# Patient Record
Sex: Female | Born: 1970 | Hispanic: Yes | Smoking: Never smoker
Health system: Southern US, Community
[De-identification: ages and names within clinical notes are randomized; demographics above are authoritative.]

## PROBLEM LIST (undated history)

## (undated) DIAGNOSIS — R7303 Prediabetes: Secondary | ICD-10-CM

## (undated) DIAGNOSIS — R011 Cardiac murmur, unspecified: Secondary | ICD-10-CM

## (undated) DIAGNOSIS — I10 Essential (primary) hypertension: Secondary | ICD-10-CM

## (undated) DIAGNOSIS — D649 Anemia, unspecified: Secondary | ICD-10-CM

## (undated) DIAGNOSIS — D259 Leiomyoma of uterus, unspecified: Secondary | ICD-10-CM

## (undated) HISTORY — DX: Prediabetes: R73.03

## (undated) HISTORY — DX: Essential (primary) hypertension: I10

## (undated) HISTORY — DX: Anemia, unspecified: D64.9

## (undated) HISTORY — DX: Cardiac murmur, unspecified: R01.1

## (undated) HISTORY — PX: HERNIA REPAIR: SHX51

## (undated) HISTORY — PX: BREAST IMPLANT REMOVAL: SUR1101

---

## 1999-04-25 HISTORY — PX: TUBAL LIGATION: SHX77

## 2002-04-24 HISTORY — PX: BACK SURGERY: SHX140

## 2008-04-24 HISTORY — PX: BREAST ENHANCEMENT SURGERY: SHX7

## 2011-04-25 HISTORY — PX: BREAST IMPLANT REMOVAL: SUR1101

## 2012-02-12 ENCOUNTER — Ambulatory Visit (INDEPENDENT_AMBULATORY_CARE_PROVIDER_SITE_OTHER): Payer: BC Managed Care – PPO | Admitting: Family

## 2012-02-12 ENCOUNTER — Encounter: Payer: Self-pay | Admitting: Family

## 2012-02-12 VITALS — BP 110/70 | HR 71 | Temp 98.1°F | Resp 16 | Ht 65.0 in | Wt 146.0 lb

## 2012-02-12 DIAGNOSIS — R350 Frequency of micturition: Secondary | ICD-10-CM

## 2012-02-12 DIAGNOSIS — I10 Essential (primary) hypertension: Secondary | ICD-10-CM | POA: Insufficient documentation

## 2012-02-12 DIAGNOSIS — H9190 Unspecified hearing loss, unspecified ear: Secondary | ICD-10-CM | POA: Insufficient documentation

## 2012-02-12 DIAGNOSIS — R21 Rash and other nonspecific skin eruption: Secondary | ICD-10-CM

## 2012-02-12 DIAGNOSIS — B0089 Other herpesviral infection: Secondary | ICD-10-CM | POA: Insufficient documentation

## 2012-02-12 DIAGNOSIS — N390 Urinary tract infection, site not specified: Secondary | ICD-10-CM

## 2012-02-12 DIAGNOSIS — H6120 Impacted cerumen, unspecified ear: Secondary | ICD-10-CM

## 2012-02-12 LAB — BASIC METABOLIC PANEL
Chloride: 102 mEq/L (ref 96–112)
Potassium: 4.6 mEq/L (ref 3.5–5.3)
Sodium: 137 mEq/L (ref 135–145)

## 2012-02-12 LAB — POCT URINALYSIS DIPSTICK
Ketones, UA: NEGATIVE
Spec Grav, UA: 1.025

## 2012-02-12 MED ORDER — CIPROFLOXACIN HCL 500 MG PO TABS: 500.0000 mg | ORAL_TABLET | Freq: Two times a day (BID) | ORAL | Status: DC

## 2012-02-12 NOTE — Progress Notes (Signed)
Subjective:    Patient ID: Debra Ayers, female    DOB: Jun 16, 1970, 41 y.o.   MRN: [id number]  HPI  Reports that she has been Followed in [place] (Dr. Thresa Ross).  Low  Back pain- left.  10 PM woke up with urgency.  Reports pain was very intense in her lower back- across, had to slide out of the bed. Pain was worse with any movement.  Denies dysuria or frequency.  + spotting due to recent placement of mirena.   She denies fever.  Denies past hx of low back pan.  Denies hx or kidney stones but reports hx of "kidney infection" which was very similar in nature.   Itching/burning- blister left hand.  Always in the hand.  Never in the fingers. Always around the palm of hand.  Tender, burning.    HTN- treated x 2 years.  Hx heart murmur-  Reports she was told + murmur several years ago.  Saw Cardiologist.     Review of Systems  Constitutional: Negative for fever and unexpected weight change.  HENT:       Reports that doesn't hear well from either ear.    Eyes: Negative for visual disturbance.  Respiratory: Positive for cough.        Mild cough  Gastrointestinal: Positive for constipation.  Genitourinary: Positive for frequency.  Musculoskeletal: Positive for back pain.  Skin: Positive for rash.  Neurological: Negative for headaches.  Hematological: Negative for adenopathy.  Psychiatric/Behavioral:       Denies depression/anxiety   Past Medical History  Diagnosis Date  . Hypertension   . Heart murmur     History   Social History  . Marital Status: Legally Separated    Spouse Name: N/A    Number of Children: 2  . Years of Education: N/A   Occupational History  .  [employer]   Social History Main Topics  . Smoking status: Never Smoker   . Smokeless tobacco: Never Used  . Alcohol Use: 1.8 oz/week    3 Cans of beer per week  . Drug Use: Not on file  . Sexually Active: Not on file   Other Topics Concern  . Not on file   Social History Narrative   Has 40  yr old and 12 year oldWorks as Social research officer, government at [employer] completed 2 years collegeEnjoys entertaining friends    Past Surgical History  Procedure Date  . Breast surgery 2000    Left breast biopsy, benign    Family History  Problem Relation Age of Onset  . Arthritis Mother   . Osteoporosis Mother   . Diabetes Maternal Grandmother     No Known Allergies  Current Outpatient Prescriptions on File Prior to Visit  Medication Sig Dispense Refill  . enalapril-hydrochlorothiazide (VASERETIC) 10-25 MG per tablet Take 1 tablet by mouth daily.        BP 110/70  Pulse 71  Temp 98.1 F (36.7 C) (Oral)  Resp 16  Ht 5\' 5"  (1.651 m)  Wt 146 lb 0.6 oz (66.243 kg)  BMI 24.30 kg/m2  SpO2 99%  LMP 01/29/2012        Objective:   Physical Exam  Constitutional: She is oriented to person, place, and time. She appears well-developed and well-nourished. No distress.  HENT:  Head: Normocephalic and atraumatic.       Bilateral cerumen impaction.  Cerumen removed with curette and lavage  Cardiovascular: Normal rate and regular rhythm.   No murmur heard.  Pulmonary/Chest: Effort normal and breath sounds normal. No respiratory distress. She has no wheezes. She has no rales. She exhibits no tenderness.  Genitourinary:       Bilateral CVAT L>R  Musculoskeletal: She exhibits no edema.  Neurological: She is alert and oriented to person, place, and time.  Skin: Skin is warm and dry.       Blister noted on palmar surface beneath base of thumb.  Purulent fluid noted inside blister with some surrounding erythema.    Psychiatric: She has a normal mood and affect. Her behavior is normal. Judgment and thought content normal.          Assessment & Plan:

## 2012-02-12 NOTE — Assessment & Plan Note (Signed)
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

## 2012-02-12 NOTE — Addendum Note (Signed)
Addended by: Mervin Kung A on: 02/12/2012 12:05 PM   Modules accepted: Orders

## 2012-02-12 NOTE — Patient Instructions (Addendum)
Please complete your blood work prior to leaving- we will let you know how this turns out. Please call if you develop worsening low back pain, fever over 101, or if urinary symptoms are not resolved in 3 days.  You will be contacted about your referral to the hearing specialist. Please let us know if you have not heard back within 1 week about your referral. Please schedule a fasting physical within the next 3 months. Welcome to [facility]!

## 2012-02-12 NOTE — Assessment & Plan Note (Signed)
Urine dip + leuks/blood. Will rx with cipro.  If worsening low back pain or if no improvement, continue CT.   Send urine for culture.

## 2012-02-12 NOTE — Assessment & Plan Note (Signed)
Continue vaseretic.  Obtain bmet.  BP stable.

## 2012-02-12 NOTE — Assessment & Plan Note (Signed)
Cerumen impaction removed today.  Scant cerumen remains and I have asked her to use OTC wax softening kit at home along with bulb syringe irrigation.  Refer to Audiology for further evaluation of possible hearing loss.

## 2012-02-13 ENCOUNTER — Encounter: Payer: Self-pay | Admitting: Family

## 2012-02-13 LAB — HSV 2 ANTIBODY, IGG: HSV 2 Glycoprotein G Ab, IgG: 13.6 IV — ABNORMAL HIGH

## 2012-02-13 LAB — HSV 1 ANTIBODY, IGG: HSV 1 Glycoprotein G Ab, IgG: 8.28 IV — ABNORMAL HIGH

## 2012-02-14 LAB — URINE CULTURE
Colony Count: NO GROWTH
Organism ID, Bacteria: NO GROWTH

## 2012-02-15 ENCOUNTER — Telehealth: Payer: Self-pay | Admitting: Family

## 2012-02-15 NOTE — Telephone Encounter (Signed)
Received medical records from [facility]. Melissa Champe-Seagle  P: [phone] F: [phone]

## 2012-02-16 ENCOUNTER — Telehealth: Payer: Self-pay | Admitting: Family

## 2012-02-16 NOTE — Telephone Encounter (Signed)
Spoke to pt. Reviewed + HSV1 and HSV2 results.  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 Could consider suppressive therapy at some point if she wants. Questions answered. Pt verbalizes understanding.

## 2012-02-16 NOTE — Telephone Encounter (Signed)
Also told pt that her urine culture was normal.  OK to stop abx.  Still having some back pain, recommended motrin and that she call us if no improvement.

## 2012-04-24 HISTORY — PX: BREAST SURGERY: SHX581

## 2012-05-17 ENCOUNTER — Encounter: Payer: BC Managed Care – PPO | Admitting: Family

## 2012-05-17 DIAGNOSIS — Z0289 Encounter for other administrative examinations: Secondary | ICD-10-CM

## 2012-07-31 ENCOUNTER — Ambulatory Visit
Admission: RE | Admit: 2012-07-31 | Discharge: 2012-07-31 | Disposition: A | Discharge status: Home / Self Care | Payer: BC Managed Care – PPO | Source: Ambulatory Visit | Attending: *Deleted | Admitting: *Deleted

## 2012-07-31 ENCOUNTER — Other Ambulatory Visit: Payer: Self-pay | Admitting: *Deleted

## 2012-07-31 DIAGNOSIS — R112 Nausea with vomiting, unspecified: Secondary | ICD-10-CM

## 2012-07-31 DIAGNOSIS — R109 Unspecified abdominal pain: Secondary | ICD-10-CM

## 2012-11-20 ENCOUNTER — Ambulatory Visit (INDEPENDENT_AMBULATORY_CARE_PROVIDER_SITE_OTHER): Payer: BC Managed Care – PPO | Admitting: Surgery

## 2012-11-20 ENCOUNTER — Encounter (INDEPENDENT_AMBULATORY_CARE_PROVIDER_SITE_OTHER): Payer: Self-pay | Admitting: Surgery

## 2012-11-20 ENCOUNTER — Telehealth (INDEPENDENT_AMBULATORY_CARE_PROVIDER_SITE_OTHER): Payer: Self-pay | Admitting: Surgery

## 2012-11-20 VITALS — BP 130/76 | HR 64 | Temp 97.8°F | Resp 14 | Ht 64.0 in | Wt 158.8 lb

## 2012-11-20 DIAGNOSIS — K420 Umbilical hernia with obstruction, without gangrene: Secondary | ICD-10-CM | POA: Insufficient documentation

## 2012-11-20 NOTE — Progress Notes (Signed)
General Surgery [facility]  Chief Complaint  Patient presents with  . New Evaluation    eval umb hernia - referral from Dr. Etter Sjogren    HISTORY: Patient is a 42 year old female referred by her plastic surgeon for evaluation of suspected umbilical hernia. Patient has noted a change in the shape of her umbilicus over the past several years. She has intermittent discomfort especially with physical activity. She denies any signs or symptoms of intestinal obstruction.  No prior abdominal surgery. Patient has had piercings at the umbilicus.  Patient is considering like a suction of the torso. The plastic surgeon identified a possible umbilical hernia and referred her to our office for evaluation and recommendations for management.  Past Medical History  Diagnosis Date  . Hypertension   . Heart murmur   . Anemia     Current Outpatient Prescriptions  Medication Sig Dispense Refill  . enalapril-hydrochlorothiazide (VASERETIC) 10-25 MG per tablet Take 1 tablet by mouth daily.       No current facility-administered medications for this visit.    No Known Allergies  Family History  Problem Relation Age of Onset  . Arthritis Mother   . Osteoporosis Mother   . Diabetes Maternal Grandmother     History   Social History  . Marital Status: Legally Separated    Spouse Name: N/A    Number of Children: 2  . Years of Education: N/A   Occupational History  .  [employer]   Social History Main Topics  . Smoking status: Never Smoker   . Smokeless tobacco: Never Used  . Alcohol Use: 1.8 oz/week    3 Cans of beer per week  . Drug Use: No  . Sexually Active: None   Other Topics Concern  . None   Social History Narrative   Has 41 yr old and 41 year old   Works as Social research officer, government at [employer]   Separated   She completed 2 years college   Enjoys entertaining friends    REVIEW OF SYSTEMS - PERTINENT POSITIVES ONLY: Denies signs or symptoms of  obstruction. Intermittent discomfort with physical activity. Change in shape and appearance of umbilicus over past several years.  EXAM: Filed Vitals:   11/20/12 0924  BP: 130/76  Pulse: 64  Temp: 97.8 F (36.6 C)  Resp: 14    HEENT: normocephalic; pupils equal and reactive; sclerae clear; dentition good; mucous membranes moist NECK:  symmetric on extension; no palpable anterior or posterior cervical lymphadenopathy; no supraclavicular masses; no tenderness CHEST: clear to auscultation bilaterally without rales, rhonchi, or wheezes CARDIAC: regular rate and rhythm without significant murmur; peripheral pulses are full ABDOMEN: soft without distension; bowel sounds present; no mass; no hepatosplenomegaly; deformity of the umbilicus with flattening; palpation reveals incarcerated adipose tissue, likely omentum, with moderate tenderness; hernia augments with Valsalva and set up maneuver; hernia is only partially reducible with some discomfort EXT:  non-tender without edema; no deformity NEURO: no gross focal deficits; no sign of tremor   LABORATORY RESULTS: See [facility] (CHL-Epic) for most recent results  RADIOLOGY RESULTS: See [facility] (CHL-Epic) for most recent results  IMPRESSION: Small umbilical hernia with incarcerated omentum  PLAN: I discussed the indications for surgical repair with the patient. I explained that we would use prosthetic mesh. We discussed the risk and benefits including the potential risk of recurrence which is approximately 5%.  Certainly her umbilical hernia repair procedure could be performed at the time of her  plastic surgical procedures. We will coordinate this with Dr. Etter Sjogren for a time convenient for the patient in the near future.  The risks and benefits of the procedure have been discussed at length with the patient.  The patient understands the proposed procedure, potential alternative treatments, and the course of recovery to be  expected.  All of the patient's questions have been answered at this time.  The patient wishes to proceed with surgery.  Velora Heckler, MD, FACS General & Endocrine Surgery [facility]  Primary Care Physician: Lemont Fillers., NP

## 2012-11-20 NOTE — Telephone Encounter (Signed)
Met with surgery scheduling went over financial responsibilities, co-ord surgery with Dr. Odis Luster, patient will call back to schedule

## 2012-11-20 NOTE — Patient Instructions (Signed)
[facility]  HERNIA REPAIR POST OP INSTRUCTIONS  Always review your discharge instruction sheet given to you by the facility where your surgery was performed.  1. A  prescription for pain medication may be given to you upon discharge.  Take your pain medication as prescribed.  If narcotic pain medicine is not needed, then you may take acetaminophen (Tylenol) or ibuprofen (Advil) as needed.  2. Take your usually prescribed medications unless otherwise directed.  3. If you need a refill on your pain medication, please contact your pharmacy.  They will contact our office to request authorization. Prescriptions will not be filled after 5 pm daily or on weekends.  4. You should follow a light diet the first 24 hours after arrival home, such as soup and crackers or toast.  Be sure to include plenty of fluids daily.  Resume your normal diet the day after surgery.  5. Most patients will experience some swelling and bruising around the surgical site.  Ice packs and reclining will help.  Swelling and bruising can take several days to resolve.   6. It is common to experience some constipation if taking pain medication after surgery.  Increasing fluid intake and taking a stool softener (such as Colace) will usually help or prevent this problem from occurring.  A mild laxative (Milk of Magnesia or Miralax) should be taken according to package directions if there are no bowel movements after 48 hours.  7. Unless discharge instructions indicate otherwise, you may remove your bandages 24-48 hours after surgery, and you may shower at that time.  You may have steri-strips (small skin tapes) in place directly over the incision.  These strips should be left on the skin for 7-10 days.  If your surgeon used skin glue on the incision, you may shower in 24 hours.  The glue will flake off over the next 2-3 weeks.  Any sutures or staples will be removed at the office during your follow-up  visit.  8. ACTIVITIES:  You may resume regular (light) daily activities beginning the next day-such as daily self-care, walking, climbing stairs-gradually increasing activities as tolerated.  You may have sexual intercourse when it is comfortable.  Refrain from any heavy lifting or straining until approved by your doctor.  You may drive when you are no longer taking prescription pain medication, you can comfortably wear a seatbelt, and you can safely maneuver your car and apply brakes.  9. You should see your doctor in the office for a follow-up appointment approximately 2-3 weeks after your surgery.  Make sure that you call for this appointment within a day or two after you arrive home to insure a convenient appointment time. 10.   WHEN TO CALL YOUR DOCTOR: 1. Fever greater than 101.0 2. Inability to urinate 3. Persistent nausea and/or vomiting 4. Extreme swelling or bruising 5. Continued bleeding from incision 6. Increased pain, redness, or drainage from the incision  The clinic staff is available to answer your questions during regular business hours.  Please don't hesitate to call and ask to speak to one of the nurses for clinical concerns.  If you have a medical emergency, go to the nearest emergency room or call 911.  A surgeon from [facility] is always on call for the hospital.   [facility] [address], [address], [place], [place]  [address]  [phone] ? [phone] ? FAX [phone]  [email or web address]   

## 2012-12-30 ENCOUNTER — Emergency Department (INDEPENDENT_AMBULATORY_CARE_PROVIDER_SITE_OTHER)
Admission: EM | Admit: 2012-12-30 | Discharge: 2012-12-30 | Disposition: A | Discharge status: Home / Self Care | Payer: BC Managed Care – PPO | Source: Home / Self Care | Attending: Family Medicine | Admitting: Family Medicine

## 2012-12-30 ENCOUNTER — Encounter (HOSPITAL_COMMUNITY): Payer: Self-pay

## 2012-12-30 DIAGNOSIS — M545 Low back pain, unspecified: Secondary | ICD-10-CM

## 2012-12-30 DIAGNOSIS — N390 Urinary tract infection, site not specified: Secondary | ICD-10-CM

## 2012-12-30 LAB — POCT URINALYSIS DIP (DEVICE)
Ketones, ur: NEGATIVE mg/dL
Protein, ur: NEGATIVE mg/dL
Specific Gravity, Urine: 1.025 (ref 1.005–1.030)

## 2012-12-30 LAB — POCT PREGNANCY, URINE: Preg Test, Ur: NEGATIVE

## 2012-12-30 MED ORDER — MELOXICAM 15 MG PO TABS: 15.0000 mg | ORAL_TABLET | Freq: Every day | ORAL | Status: DC | PRN

## 2012-12-30 MED ORDER — CYCLOBENZAPRINE HCL 10 MG PO TABS: 10.0000 mg | ORAL_TABLET | Freq: Every evening | ORAL | Status: DC | PRN

## 2012-12-30 MED ORDER — CEPHALEXIN 500 MG PO CAPS: 500.0000 mg | ORAL_CAPSULE | Freq: Two times a day (BID) | ORAL | Status: DC

## 2012-12-30 NOTE — ED Notes (Signed)
eval by MD only 

## 2012-12-30 NOTE — ED Provider Notes (Signed)
Debra Ayers is a 42 y.o. female who presents to Urgent Care today for low back pain present for one week. Patient has bilateral low back pain. She denies any injury but does note she slept on an air mattress preceding her onset of back pain. She denies any radiating pain weakness or numbness. She additionally notes some mild dysuria but denies any significant frequency or urgency. She denies significant abdominal pain. She denies any nausea vomiting or diarrhea fevers or chills. She's tried Motrin and heating pad which is only worked little bit. Her back pain is worse with activity. The pain is mild to moderate. She works as a Social research officer, government at [employer].   Past Medical History  Diagnosis Date  . Hypertension   . Heart murmur   . Anemia    History  Substance Use Topics  . Smoking status: Never Smoker   . Smokeless tobacco: Never Used  . Alcohol Use: 1.8 oz/week    3 Cans of beer per week   ROS as above Medications reviewed. No current facility-administered medications for this encounter.   Current Outpatient Prescriptions  Medication Sig Dispense Refill  . cephALEXin (KEFLEX) 500 MG capsule Take 1 capsule (500 mg total) by mouth 2 (two) times daily.  14 capsule  0  . cyclobenzaprine (FLEXERIL) 10 MG tablet Take 1 tablet (10 mg total) by mouth at bedtime as needed for muscle spasms.  20 tablet  0  . enalapril-hydrochlorothiazide (VASERETIC) 10-25 MG per tablet Take 1 tablet by mouth daily.      . meloxicam (MOBIC) 15 MG tablet Take 1 tablet (15 mg total) by mouth daily as needed for pain.  30 tablet  0    Exam:  BP 142/97  Pulse 63  Temp(Src) 99.1 F (37.3 C) (Oral)  Resp 16  SpO2 100% Gen: Well NAD HEENT: EOMI,  MMM Lungs: CTABL Nl WOB Heart: RRR no MRG Abd: NABS, nondistended mildly tender bilateral lower corner and is without rebound or guarding Exts: Non edematous BL  LE, warm and well perfused.  MSK: Back: Nontender to spinal midline. Mildly tender to palpation  bilateral SI joint. Normal range of motion negative straight leg raise test negative Faber test strength sensation capillary refill intact distally. Patient can stand on heels and toes and squat down normally. She has a normal gait.  Results for orders placed during the hospital encounter of 12/30/12 (from the past 24 hour(s))  POCT PREGNANCY, URINE     Status: None   Collection Time    12/30/12  8:46 AM      Result Value Range   Preg Test, Ur NEGATIVE  NEGATIVE  POCT URINALYSIS DIP (DEVICE)     Status: Abnormal   Collection Time    12/30/12  8:47 AM      Result Value Range   Glucose, UA NEGATIVE  NEGATIVE mg/dL   Bilirubin Urine NEGATIVE  NEGATIVE   Ketones, ur NEGATIVE  NEGATIVE mg/dL   Specific Gravity, Urine 1.025  1.005 - 1.030   Hgb urine dipstick NEGATIVE  NEGATIVE   pH 6.0  5.0 - 8.0   Protein, ur NEGATIVE  NEGATIVE mg/dL   Urobilinogen, UA 0.2  0.0 - 1.0 mg/dL   Nitrite NEGATIVE  NEGATIVE   Leukocytes, UA TRACE (*) NEGATIVE   No results found.  Assessment and Plan: 42 y.o. female with one day ago with possible urinary tract infection. Plan to treat lumbago with relative rest home exercise program meloxicam and Flexeril. Additionally  using heating pad. Followup in sports medicine in one or 2 weeks if not improving.  Patient may have a urinary tract infection based on mild dysuria and trace leukocyte esterase. Plan to treat with Keflex. Followup as needed  Discussed warning signs or symptoms. Please see discharge instructions. Patient expresses understanding.      Rodolph Bong, MD 12/30/12 [phone]

## 2013-01-02 ENCOUNTER — Ambulatory Visit (HOSPITAL_BASED_OUTPATIENT_CLINIC_OR_DEPARTMENT_OTHER)
Admission: RE | Admit: 2013-01-02 | Discharge: 2013-01-02 | Disposition: A | Discharge status: Home / Self Care | Payer: BC Managed Care – PPO | Source: Ambulatory Visit | Attending: Physician Assistant | Admitting: Physician Assistant

## 2013-01-02 ENCOUNTER — Other Ambulatory Visit: Payer: Self-pay | Admitting: Physician Assistant

## 2013-01-02 ENCOUNTER — Ambulatory Visit (INDEPENDENT_AMBULATORY_CARE_PROVIDER_SITE_OTHER): Payer: BC Managed Care – PPO | Admitting: Physician Assistant

## 2013-01-02 ENCOUNTER — Encounter: Payer: Self-pay | Admitting: Physician Assistant

## 2013-01-02 VITALS — BP 139/98 | HR 81 | Temp 98.5°F | Resp 14 | Wt 159.8 lb

## 2013-01-02 DIAGNOSIS — R109 Unspecified abdominal pain: Secondary | ICD-10-CM

## 2013-01-02 DIAGNOSIS — D259 Leiomyoma of uterus, unspecified: Secondary | ICD-10-CM | POA: Insufficient documentation

## 2013-01-02 DIAGNOSIS — M549 Dorsalgia, unspecified: Secondary | ICD-10-CM

## 2013-01-02 DIAGNOSIS — R112 Nausea with vomiting, unspecified: Secondary | ICD-10-CM | POA: Insufficient documentation

## 2013-01-02 DIAGNOSIS — M545 Low back pain, unspecified: Secondary | ICD-10-CM

## 2013-01-02 LAB — CBC WITH DIFFERENTIAL/PLATELET
Basophils Absolute: 0 10*3/uL (ref 0.0–0.1)
Basophils Relative: 0 % (ref 0–1)
Lymphocytes Relative: 20 % (ref 12–46)
Neutro Abs: 6.2 10*3/uL (ref 1.7–7.7)
Neutrophils Relative %: 68 % (ref 43–77)
Platelets: 248 10*3/uL (ref 150–400)
RDW: 14.5 % (ref 11.5–15.5)
WBC: 9 10*3/uL (ref 4.0–10.5)

## 2013-01-02 LAB — POCT URINALYSIS DIPSTICK
Ketones, UA: NEGATIVE
Nitrite, UA: NEGATIVE
Protein, UA: NEGATIVE
Urobilinogen, UA: 0.2

## 2013-01-02 LAB — BASIC METABOLIC PANEL
Chloride: 102 mEq/L (ref 96–112)
Potassium: 4.9 mEq/L (ref 3.5–5.3)

## 2013-01-02 NOTE — Progress Notes (Signed)
Patient ID: Debra Ayers, female   DOB: 1971-03-02, 42 y.o.   MRN: [id number]  Patient presents to clinic today c/o R sided back pain over the past week and a half.  Patient was seen in ED on 9/8 and diagnosed with UTI and muscle strain.  Given Rx for Mobic, Flexeril and Keflex.  Patient states symptoms have not improved over the past few days.  Patient denies trauma, fall or other injury.  Patient states pain is right sided, intermittent, and associated with some nausea.  Denies history of recurrent UTI or kidney stone.  States the pain is sometimes worse with motions and she cannot seem to get comfortable.  Patient does note that she thought she felt/heard something hit the toilet when she was urinating the other day.  States she flushed the toilet without seeing if there was a stone.    Past Medical History  Diagnosis Date  . Hypertension   . Heart murmur   . Anemia     Current Outpatient Prescriptions on File Prior to Visit  Medication Sig Dispense Refill  . cephALEXin (KEFLEX) 500 MG capsule Take 1 capsule (500 mg total) by mouth 2 (two) times daily.  14 capsule  0  . cyclobenzaprine (FLEXERIL) 10 MG tablet Take 1 tablet (10 mg total) by mouth at bedtime as needed for muscle spasms.  20 tablet  0  . enalapril-hydrochlorothiazide (VASERETIC) 10-25 MG per tablet Take 1 tablet by mouth daily.      . meloxicam (MOBIC) 15 MG tablet Take 1 tablet (15 mg total) by mouth daily as needed for pain.  30 tablet  0   No current facility-administered medications on file prior to visit.    No Known Allergies  Family History  Problem Relation Age of Onset  . Arthritis Mother   . Osteoporosis Mother   . Diabetes Maternal Grandmother     History   Social History  . Marital Status: Legally Separated    Spouse Name: N/A    Number of Children: 2  . Years of Education: N/A   Occupational History  .  [employer]   Social History Main Topics  . Smoking status: Never Smoker   . Smokeless  tobacco: Never Used  . Alcohol Use: 1.8 oz/week    3 Cans of beer per week  . Drug Use: No  . Sexual Activity: None   Other Topics Concern  . 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  Gastrointestinal: Positive for nausea. Negative for vomiting, abdominal pain, diarrhea and constipation.  Genitourinary: Positive for frequency and flank pain. Negative for dysuria, urgency and hematuria.  Musculoskeletal: Positive for back pain.  Neurological: Negative for tingling and sensory change.   Filed Vitals:   01/02/13 0940  BP: 139/98  Pulse: 81  Temp: 98.5 F (36.9 C)  Resp: 14   Physical Exam  Vitals reviewed. Constitutional: She is oriented to person, place, and time and well-developed, well-nourished, and in no distress.  HENT:  Head: Atraumatic.  Cardiovascular: Normal rate, regular rhythm and normal heart sounds.   Pulmonary/Chest: Effort normal and breath sounds normal.  Abdominal: Soft. Bowel sounds are normal. She exhibits no distension. There is no tenderness.  Musculoskeletal:  ROM normal.  Patient with some tenderness of R paraspinal musculature.  Patient also with right CVA tenderness.  No bony tenderness or abnormality noted.  Neurological: She is alert and oriented to person, place, and time.  Skin: Skin is warm and dry. No rash noted.   Recent Results (from the past 2160 hour(s))  POCT PREGNANCY, URINE     Status: None   Collection Time    12/30/12  8:46 AM      Result Value Range   Preg Test, Ur NEGATIVE  NEGATIVE   Comment:            THE SENSITIVITY OF THIS     METHODOLOGY IS >24 mIU/mL  POCT URINALYSIS DIP (DEVICE)     Status: Abnormal   Collection Time    12/30/12  8:47 AM      Result Value Range   Glucose, UA NEGATIVE   NEGATIVE mg/dL   Bilirubin Urine NEGATIVE  NEGATIVE   Ketones, ur NEGATIVE  NEGATIVE mg/dL   Specific Gravity, Urine 1.025  1.005 - 1.030   Hgb urine dipstick NEGATIVE  NEGATIVE   pH 6.0  5.0 - 8.0   Protein, ur NEGATIVE  NEGATIVE mg/dL   Urobilinogen, UA 0.2  0.0 - 1.0 mg/dL   Nitrite NEGATIVE  NEGATIVE   Leukocytes, UA TRACE (*) NEGATIVE   Comment: Biochemical Testing Only. Please order routine urinalysis from main lab if confirmatory testing is needed.  POCT URINALYSIS DIPSTICK     Status: None   Collection Time    01/02/13 10:33 AM      Result Value Range   Color, UA Gold     Clarity, UA Sediment     Glucose, UA neg     Bilirubin, UA neg     Ketones, UA neg     Spec Grav, UA >=1.030     Blood, UA neg     pH, UA 6.0     Protein, UA neg     Urobilinogen, UA 0.2     Nitrite, UA neg     Leukocytes, UA Trace    CBC WITH DIFFERENTIAL     Status: Abnormal   Collection Time    01/02/13 10:39 AM      Result Value Range   WBC 9.0  4.0 - 10.5 K/uL   RBC 5.32 (*) 3.87 - 5.11 MIL/uL   Hemoglobin 15.0  12.0 - 15.0 g/dL   HCT 16.1  09.6 - 04.5 %   MCV 84.0  78.0 - 100.0 fL   MCH 28.2  26.0 - 34.0 pg   MCHC 33.6  30.0 - 36.0 g/dL   RDW 40.9  81.1 - 91.4 %   Platelets 248  150 - 400 K/uL   Neutrophils Relative % 68  43 - 77 %   Neutro Abs 6.2  1.7 - 7.7 K/uL   Lymphocytes Relative 20  12 - 46 %   Lymphs Abs 1.8  0.7 - 4.0 K/uL   Monocytes Relative 10  3 - 12 %   Monocytes Absolute 0.9  0.1 - 1.0 K/uL   Eosinophils Relative 2  0 - 5 %   Eosinophils Absolute 0.1  0.0 - 0.7 K/uL   Basophils Relative 0  0 - 1 %   Basophils Absolute 0.0  0.0 - 0.1 K/uL   Smear Review Criteria for review not met    BASIC METABOLIC PANEL     Status: None   Collection Time    01/02/13  10:39 AM      Result Value Range   Sodium    135 - 145 mEq/L   Potassium    3.5 - 5.3 mEq/L   Chloride    96 - 112 mEq/L   CO2    19 - 32 mEq/L   Glucose, Bld    70 - 99 mg/dL   BUN    6 - 23 mg/dL   Creat     1.61 - 0.96 mg/dL   Calcium    8.4 - 04.5 mg/dL   Assessment/Plan: LBP (low back pain) Possibly multifactorial.  Definitely MSK component.  Continue Mobic, Flexeril, Alternate Ice/Heat.  Topical Aspercreme.  Also possibility of nephrolithiasis given urinary symptoms and flank pain.  CT abdomen w/o contrast.  Send urine for culture.  CBC and BMET. Encourage increase in fluids.

## 2013-01-02 NOTE — Patient Instructions (Signed)
Please obtain imaging.  I will send your urine for culture and microscopy.  I will call you with all results.  Continue Mobic for pain.  Increase fluid intake.  Soaking in hot bath helps alleviate pain.    Kidney Stones Kidney stones (ureteral lithiasis) are deposits that form inside your kidneys. The intense pain is caused by the stone moving through the urinary tract. When the stone moves, the ureter goes into spasm around the stone. The stone is usually passed in the urine.  CAUSES   A disorder that makes certain neck glands produce too much parathyroid hormone (primary hyperparathyroidism).  A buildup of uric acid crystals.  Narrowing (stricture) of the ureter.  A kidney obstruction present at birth (congenital obstruction).  Previous surgery on the kidney or ureters.  Numerous kidney infections. SYMPTOMS   Feeling sick to your stomach (nauseous).  Throwing up (vomiting).  Blood in the urine (hematuria).  Pain that usually spreads (radiates) to the groin.  Frequency or urgency of urination. DIAGNOSIS   Taking a history and physical exam.  Blood or urine tests.  Computerized X-ray scan (CT scan).  Occasionally, an examination of the inside of the urinary bladder (cystoscopy) is performed. TREATMENT   Observation.  Increasing your fluid intake.  Surgery may be needed if you have severe pain or persistent obstruction. The size, location, and chemical composition are all important variables that will determine the proper choice of action for you. Talk to your caregiver to better understand your situation so that you will minimize the risk of injury to yourself and your kidney.  HOME CARE INSTRUCTIONS   Drink enough water and fluids to keep your urine clear or pale yellow.  Strain all urine through the provided strainer. Keep all particulate matter and stones for your caregiver to see. The stone causing the pain may be as small as a grain of salt. It is very  important to use the strainer each and every time you pass your urine. The collection of your stone will allow your caregiver to analyze it and verify that a stone has actually passed.  Only take over-the-counter or prescription medicines for pain, discomfort, or fever as directed by your caregiver.  Make a follow-up appointment with your caregiver as directed.  Get follow-up X-rays if required. The absence of pain does not always mean that the stone has passed. It may have only stopped moving. If the urine remains completely obstructed, it can cause loss of kidney function or even complete destruction of the kidney. It is your responsibility to make sure X-rays and follow-ups are completed. Ultrasounds of the kidney can show blockages and the status of the kidney. Ultrasounds are not associated with any radiation and can be performed easily in a matter of minutes. SEEK IMMEDIATE MEDICAL CARE IF:   Pain cannot be controlled with the prescribed medicine.  You have a fever.  The severity or intensity of pain increases over 18 hours and is not relieved by pain medicine.  You develop a new onset of abdominal pain.  You feel faint or pass out. MAKE SURE YOU:   Understand these instructions.  Will watch your condition.  Will get help right away if you are not doing well or get worse. Document Released: 04/10/2005 Document Revised: 07/03/2011 Document Reviewed: 08/06/2009 [facility] Patient Information 2014 [place], [place].

## 2013-01-02 NOTE — Assessment & Plan Note (Addendum)
Possibly multifactorial.  Definitely MSK component.  Continue Mobic, Flexeril, Alternate Ice/Heat.  Topical Aspercreme.  Also possibility of nephrolithiasis given urinary symptoms and flank pain.  CT abdomen w/o contrast.  Send urine for culture.  CBC and BMET. Encourage increase in fluids.

## 2013-01-03 ENCOUNTER — Telehealth: Payer: Self-pay | Admitting: Physician Assistant

## 2013-01-03 LAB — URINALYSIS, ROUTINE W REFLEX MICROSCOPIC
Bilirubin Urine: NEGATIVE
Leukocytes, UA: NEGATIVE
Nitrite: NEGATIVE
Specific Gravity, Urine: 1.026 (ref 1.005–1.030)
Urobilinogen, UA: 0.2 mg/dL (ref 0.0–1.0)

## 2013-01-03 LAB — URINALYSIS, MICROSCOPIC ONLY
Bacteria, UA: NONE SEEN
Casts: NONE SEEN

## 2013-01-03 LAB — CULTURE, URINE COMPREHENSIVE: Colony Count: NO GROWTH

## 2013-01-03 NOTE — Telephone Encounter (Signed)
Patient informed, understood & agreed/SLS  

## 2013-01-03 NOTE — Telephone Encounter (Signed)
Patient would like to be called with her CT  Result,  Done yesterday

## 2013-01-06 ENCOUNTER — Encounter: Payer: Self-pay | Admitting: Physician Assistant

## 2013-01-06 ENCOUNTER — Ambulatory Visit (INDEPENDENT_AMBULATORY_CARE_PROVIDER_SITE_OTHER): Payer: BC Managed Care – PPO | Admitting: Physician Assistant

## 2013-01-06 VITALS — BP 162/102 | HR 79 | Temp 98.5°F | Resp 14 | Ht 64.0 in | Wt 160.8 lb

## 2013-01-06 DIAGNOSIS — T148XXA Other injury of unspecified body region, initial encounter: Secondary | ICD-10-CM

## 2013-01-06 DIAGNOSIS — M545 Low back pain, unspecified: Secondary | ICD-10-CM

## 2013-01-06 MED ORDER — METHOCARBAMOL 500 MG PO TABS: 500.0000 mg | ORAL_TABLET | Freq: Three times a day (TID) | ORAL | Status: DC

## 2013-01-06 MED ORDER — METHYLPREDNISOLONE (PAK) 4 MG PO TABS: ORAL_TABLET | ORAL | Status: DC

## 2013-01-06 NOTE — Patient Instructions (Signed)
Please take medrol dose pack, following packet instructions.  Continue rest, ice, topical aspercreme.  Low back pain can persist for a few weeks, but most people are back to normal within 4 weeks.  If symptoms are not improving, we will need to consider orthopedics and/or physical therapy.  Muscle Strain Muscle strain occurs when a muscle is stretched beyond its normal length. A small number of muscle fibers generally are torn. This is especially common in athletes. This happens when a sudden, violent force placed on a muscle stretches it too far. Usually, recovery from muscle strain takes 1 to 2 weeks. Complete healing will take 5 to 6 weeks.  HOME CARE INSTRUCTIONS   While awake, apply ice to the sore muscle for the first 2 days after the injury.  Put ice in a plastic bag.  Place a towel between your skin and the bag.  Leave the ice on for 15-20 minutes each hour.  Do not use the strained muscle for several days, until you no longer have pain.  You may wrap the injured area with an elastic bandage for comfort. Be careful not to wrap it too tightly. This may interfere with blood circulation or increase swelling.  Only take over-the-counter or prescription medicines for pain, discomfort, or fever as directed by your caregiver. SEEK MEDICAL CARE IF:  You have increasing pain or swelling in the injured area. MAKE SURE YOU:   Understand these instructions.  Will watch your condition.  Will get help right away if you are not doing well or get worse. Document Released: 04/10/2005 Document Revised: 07/03/2011 Document Reviewed: 04/22/2011 [facility] Patient Information 2014 [place], [place].

## 2013-01-06 NOTE — Assessment & Plan Note (Signed)
Continue conservative therapy.  Rx Medrol dose pack.  Rx Robaxin.  Follow-up in 1-2 weeks.  Patient educated that low back pain can take a few weeks to resolve, but it does usually resolve on it's own.

## 2013-01-06 NOTE — Progress Notes (Signed)
Patient ID: Debra Ayers, female   DOB: September 24, 1970, 42 y.o.   MRN: [id number]  Patient presents to clinic today c/o continued back pain.  Back pain has been present for 7-10 days.  Patient endorses trying conservative measures including RICE therapy.  Has not taken Mobic or flexeril as prescribed.  States flexeril makes her too groggy. 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   Patient 's flank pain has resolved since last visit, as well as urinary urgency.  Patient denies no new complaints.  Wants to know if she can continue going to the gym.  Past Medical History  Diagnosis Date  . Hypertension   . Heart murmur   . Anemia     Current Outpatient Prescriptions on File Prior to Visit  Medication Sig Dispense Refill  . enalapril-hydrochlorothiazide (VASERETIC) 10-25 MG per tablet Take 1 tablet by mouth daily.      . meloxicam (MOBIC) 15 MG tablet Take 1 tablet (15 mg total) by mouth daily as needed for pain.  30 tablet  0   No current facility-administered medications on file prior to visit.    No Known Allergies  Family History  Problem Relation Age of Onset  . Arthritis Mother   . Osteoporosis Mother   . Diabetes Maternal Grandmother     History   Social History  . Marital Status: Legally Separated    Spouse Name: N/A    Number of Children: 2  . Years of Education: N/A   Occupational History  .  [employer]   Social History Main Topics  . Smoking status: Never Smoker   . Smokeless tobacco: Never Used  . Alcohol Use: 1.8 oz/week    3 Cans of beer per week  . Drug Use: No  . Sexual Activity: None   Other Topics Concern  . None   Social History Narrative   Has 41 yr old and 41 year old   Works as Social research officer, government at [employer]   Separated   She completed 2 years college   Enjoys entertaining friends   Review of Systems  Constitutional: Negative for fever, chills and malaise/fatigue.  HENT: Negative for neck  pain.   Gastrointestinal: Negative for nausea, vomiting, abdominal pain and constipation.  Genitourinary: Negative for dysuria, urgency, frequency, hematuria and flank pain.  Musculoskeletal: Positive for back pain. Negative for myalgias, joint pain and falls.  Neurological: Negative for tingling and sensory change.   Filed Vitals:   01/06/13 1508  BP: 162/102  Pulse: 79  Temp: 98.5 F (36.9 C)  Resp: 14   Physical Exam  Vitals reviewed. Constitutional: She is oriented to person, place, and time and well-developed, well-nourished, and in no distress.  HENT:  Head: Normocephalic and atraumatic.  Musculoskeletal:  Normal range of motion.  Flexion and extension of torso reproduce pain.  No muscle spasm noted.  Neurological: She is alert and oriented to person, place, and time. GCS score is 15.  Skin: Skin is warm and dry. No rash noted.   Recent Results (from the past 2160 hour(s))  POCT PREGNANCY, URINE     Status: None   Collection Time    12/30/12  8:46 AM      Result Value Range   Preg Test, Ur NEGATIVE  NEGATIVE   Comment:            THE SENSITIVITY OF THIS     METHODOLOGY IS >24 mIU/mL  POCT URINALYSIS DIP (DEVICE)     Status: Abnormal   Collection Time    12/30/12  8:47 AM      Result Value Range   Glucose, UA NEGATIVE  NEGATIVE mg/dL   Bilirubin Urine NEGATIVE  NEGATIVE   Ketones, ur NEGATIVE  NEGATIVE mg/dL   Specific Gravity, Urine 1.025  1.005 - 1.030   Hgb urine dipstick NEGATIVE  NEGATIVE   pH 6.0  5.0 - 8.0   Protein, ur NEGATIVE  NEGATIVE mg/dL   Urobilinogen, UA 0.2  0.0 - 1.0 mg/dL   Nitrite NEGATIVE  NEGATIVE   Leukocytes, UA TRACE (*) NEGATIVE   Comment: Biochemical Testing Only. Please order routine urinalysis from main lab if confirmatory testing is needed.  CULTURE, URINE COMPREHENSIVE     Status: None   Collection Time    01/02/13 10:28 AM      Result Value Range   Colony Count NO GROWTH     Organism ID, Bacteria NO GROWTH    URINALYSIS,  ROUTINE W REFLEX MICROSCOPIC     Status: Abnormal   Collection Time    01/02/13 10:28 AM      Result Value Range   Color, Urine YELLOW  YELLOW   APPearance TURBID (*) CLEAR   Specific Gravity, Urine 1.026  1.005 - 1.030   pH 5.0  5.0 - 8.0   Glucose, UA NEG  NEG mg/dL   Bilirubin Urine NEG  NEG   Ketones, ur NEG  NEG mg/dL   Hgb urine dipstick NEG  NEG   Protein, ur NEG  NEG mg/dL   Urobilinogen, UA 0.2  0.0 - 1.0 mg/dL   Nitrite NEG  NEG   Leukocytes, UA NEG  NEG  URINALYSIS, MICROSCOPIC ONLY     Status: None   Collection Time    01/02/13 10:28 AM      Result Value Range   Squamous Epithelial / LPF RARE  RARE   Crystals Calcium Oxalate crystals noted  NONE SEEN   Casts NONE SEEN  NONE SEEN   WBC, UA 0-2  <3 WBC/hpf   RBC / HPF 0-2  <3 RBC/hpf   Bacteria, UA NONE SEEN  RARE  POCT URINALYSIS DIPSTICK     Status: None   Collection Time    01/02/13 10:33 AM      Result Value Range   Color, UA Gold     Clarity, UA Sediment     Glucose, UA neg     Bilirubin, UA neg     Ketones, UA neg     Spec Grav, UA >=1.030     Blood, UA neg     pH, UA 6.0     Protein, UA neg     Urobilinogen, UA 0.2     Nitrite, UA neg     Leukocytes, UA Trace    CBC WITH DIFFERENTIAL     Status: Abnormal   Collection Time    01/02/13 10:39 AM      Result Value Range   WBC 9.0  4.0 - 10.5 K/uL   RBC 5.32 (*) 3.87 - 5.11 MIL/uL   Hemoglobin 15.0  12.0 - 15.0 g/dL   HCT 21.3  08.6 - 57.8 %   MCV 84.0  78.0 - 100.0 fL   MCH 28.2  26.0 - 34.0 pg   MCHC 33.6  30.0 - 36.0 g/dL   RDW 46.9  62.9 - 52.8 %   Platelets 248  150 - 400 K/uL  Neutrophils Relative % 68  43 - 77 %   Neutro Abs 6.2  1.7 - 7.7 K/uL   Lymphocytes Relative 20  12 - 46 %   Lymphs Abs 1.8  0.7 - 4.0 K/uL   Monocytes Relative 10  3 - 12 %   Monocytes Absolute 0.9  0.1 - 1.0 K/uL   Eosinophils Relative 2  0 - 5 %   Eosinophils Absolute 0.1  0.0 - 0.7 K/uL   Basophils Relative 0  0 - 1 %   Basophils Absolute 0.0  0.0 - 0.1 K/uL    Smear Review Criteria for review not met    BASIC METABOLIC PANEL     Status: None   Collection Time    01/02/13 10:39 AM      Result Value Range   Sodium 137  135 - 145 mEq/L   Potassium 4.9  3.5 - 5.3 mEq/L   Chloride 102  96 - 112 mEq/L   CO2 26  19 - 32 mEq/L   Glucose, Bld 94  70 - 99 mg/dL   BUN 16  6 - 23 mg/dL   Creat 9.60  4.54 - 0.98 mg/dL   Calcium 11.9  8.4 - 14.7 mg/dL   Assessment/Plan: LBP (low back pain) Continue conservative therapy.  Rx Medrol dose pack.  Rx Robaxin.  Follow-up in 1-2 weeks.  Patient educated that low back pain can take a few weeks to resolve, but it does usually resolve on it's own.

## 2013-01-27 ENCOUNTER — Other Ambulatory Visit: Payer: Self-pay | Admitting: Family

## 2013-01-27 ENCOUNTER — Other Ambulatory Visit: Payer: Self-pay | Admitting: *Deleted

## 2013-01-27 MED ORDER — ENALAPRIL-HYDROCHLOROTHIAZIDE 10-25 MG PO TABS: 1.0000 | ORAL_TABLET | Freq: Every day | ORAL | Status: DC

## 2013-01-27 NOTE — Telephone Encounter (Signed)
90-day supply Rx request to pharmacySLS

## 2013-01-27 NOTE — Telephone Encounter (Signed)
Rx request to pharmacy; pt informed she must make a F/U appt with her PCP [O'Sullivan, Melissa] w/i the next month for any future refills, as Mr. Daphine Deutscher only seen her for Acute/ED follow-up & her PCP manages her HTN, pt understood & agreed/SLS

## 2013-06-25 ENCOUNTER — Ambulatory Visit (INDEPENDENT_AMBULATORY_CARE_PROVIDER_SITE_OTHER): Payer: BC Managed Care – PPO | Admitting: Physician Assistant

## 2013-06-25 ENCOUNTER — Encounter: Payer: Self-pay | Admitting: Physician Assistant

## 2013-06-25 ENCOUNTER — Other Ambulatory Visit: Payer: Self-pay | Admitting: Physician Assistant

## 2013-06-25 ENCOUNTER — Telehealth: Payer: Self-pay | Admitting: Physician Assistant

## 2013-06-25 VITALS — BP 134/102 | HR 77 | Temp 98.0°F | Resp 14 | Ht 64.0 in | Wt 160.0 lb

## 2013-06-25 DIAGNOSIS — Z833 Family history of diabetes mellitus: Secondary | ICD-10-CM | POA: Insufficient documentation

## 2013-06-25 DIAGNOSIS — I10 Essential (primary) hypertension: Secondary | ICD-10-CM

## 2013-06-25 LAB — BASIC METABOLIC PANEL
BUN: 16 mg/dL (ref 6–23)
CHLORIDE: 102 meq/L (ref 96–112)
CO2: 25 mEq/L (ref 19–32)
Calcium: 9.5 mg/dL (ref 8.4–10.5)
Creat: 0.82 mg/dL (ref 0.50–1.10)
GLUCOSE: 79 mg/dL (ref 70–99)
POTASSIUM: 4.2 meq/L (ref 3.5–5.3)
SODIUM: 134 meq/L — AB (ref 135–145)

## 2013-06-25 LAB — HEMOGLOBIN A1C
HEMOGLOBIN A1C: 5.5 % (ref <5.7)
MEAN PLASMA GLUCOSE: 111 mg/dL (ref <117)

## 2013-06-25 LAB — TSH: TSH: 1.656 u[IU]/mL (ref 0.350–4.500)

## 2013-06-25 MED ORDER — ENALAPRIL-HYDROCHLOROTHIAZIDE 10-25 MG PO TABS: ORAL_TABLET | ORAL | Status: DC

## 2013-06-25 NOTE — Patient Instructions (Signed)
Please restart medications.  Obtain labs. I will call you with your results. Read information below on DASH diet and Hypertension.  Follow-up in 3-4 weeks.  Please take medication before your appointment.  Hypertension As your heart beats, it forces blood through your arteries. This force is your blood pressure. If the pressure is too high, it is called hypertension (HTN) or high blood pressure. HTN is dangerous because you may have it and not know it. High blood pressure may mean that your heart has to work harder to pump blood. Your arteries may be narrow or stiff. The extra work puts you at risk for heart disease, stroke, and other problems.  Blood pressure consists of two numbers, a higher number over a lower, 110/72, for example. It is stated as "110 over 72." The ideal is below 120 for the top number (systolic) and under 80 for the bottom (diastolic). Write down your blood pressure today. You should pay close attention to your blood pressure if you have certain conditions such as:  Heart failure.  Prior heart attack.  Diabetes  Chronic kidney disease.  Prior stroke.  Multiple risk factors for heart disease. To see if you have HTN, your blood pressure should be measured while you are seated with your arm held at the level of the heart. It should be measured at least twice. A one-time elevated blood pressure reading (especially in the Emergency Department) does not mean that you need treatment. There may be conditions in which the blood pressure is different between your right and left arms. It is important to see your caregiver soon for a recheck. Most people have essential hypertension which means that there is not a specific cause. This type of high blood pressure may be lowered by changing lifestyle factors such as:  Stress.  Smoking.  Lack of exercise.  Excessive weight.  Drug/tobacco/alcohol use.  Eating less salt. Most people do not have symptoms from high blood pressure  until it has caused damage to the body. Effective treatment can often prevent, delay or reduce that damage. TREATMENT  When a cause has been identified, treatment for high blood pressure is directed at the cause. There are a large number of medications to treat HTN. These fall into several categories, and your caregiver will help you select the medicines that are best for you. Medications may have side effects. You should review side effects with your caregiver. If your blood pressure stays high after you have made lifestyle changes or started on medicines,   Your medication(s) may need to be changed.  Other problems may need to be addressed.  Be certain you understand your prescriptions, and know how and when to take your medicine.  Be sure to follow up with your caregiver within the time frame advised (usually within two weeks) to have your blood pressure rechecked and to review your medications.  If you are taking more than one medicine to lower your blood pressure, make sure you know how and at what times they should be taken. Taking two medicines at the same time can result in blood pressure that is too low. SEEK IMMEDIATE MEDICAL CARE IF:  You develop a severe headache, blurred or changing vision, or confusion.  You have unusual weakness or numbness, or a faint feeling.  You have severe chest or abdominal pain, vomiting, or breathing problems. MAKE SURE YOU:   Understand these instructions.  Will watch your condition.  Will get help right away if you are not doing well  or get worse. Document Released: 04/10/2005 Document Revised: 07/03/2011 Document Reviewed: 11/29/2007 [facility] Patient Information 2014 [place].  DASH Diet The DASH diet stands for "Dietary Approaches to Stop Hypertension." It is a healthy eating plan that has been shown to reduce high blood pressure (hypertension) in as little as 14 days, while also possibly providing other significant health benefits.  These other health benefits include reducing the risk of breast cancer after menopause and reducing the risk of type 2 diabetes, heart disease, colon cancer, and stroke. Health benefits also include weight loss and slowing kidney failure in patients with chronic kidney disease.  DIET GUIDELINES  Limit salt (sodium). Your diet should contain less than 1500 mg of sodium daily.  Limit refined or processed carbohydrates. Your diet should include mostly whole grains. Desserts and added sugars should be used sparingly.  Include small amounts of heart-healthy fats. These types of fats include nuts, oils, and tub margarine. Limit saturated and trans fats. These fats have been shown to be harmful in the body. CHOOSING FOODS  The following food groups are based on a 2000 calorie diet. See your Registered Dietitian for individual calorie needs. Grains and Grain Products (6 to 8 servings daily)  Eat More Often: Whole-wheat bread, brown rice, whole-grain or wheat pasta, quinoa, popcorn without added fat or salt (air popped).  Eat Less Often: White bread, white pasta, white rice, cornbread. Vegetables (4 to 5 servings daily)  Eat More Often: Fresh, frozen, and canned vegetables. Vegetables may be raw, steamed, roasted, or grilled with a minimal amount of fat.  Eat Less Often/Avoid: Creamed or fried vegetables. Vegetables in a cheese sauce. Fruit (4 to 5 servings daily)  Eat More Often: All fresh, canned (in natural juice), or frozen fruits. Dried fruits without added sugar. One hundred percent fruit juice ( cup [237 mL] daily).  Eat Less Often: Dried fruits with added sugar. Canned fruit in light or heavy syrup. [employer], Fish, and Poultry (2 servings or less daily. One serving is 3 to 4 oz [85-114 g]).  Eat More Often: Ninety percent or leaner ground beef, tenderloin, sirloin. Round cuts of beef, chicken breast, [place] breast. All fish. Grill, bake, or broil your meat. Nothing should be  fried.  Eat Less Often/Avoid: Fatty cuts of meat, [place], or chicken leg, thigh, or wing. Fried cuts of meat or fish. Dairy (2 to 3 servings)  Eat More Often: Low-fat or fat-free milk, low-fat plain or light yogurt, reduced-fat or part-skim cheese.  Eat Less Often/Avoid: Milk (whole, 2%).Whole milk yogurt. Full-fat cheeses. Nuts, Seeds, and Legumes (4 to 5 servings per week)  Eat More Often: All without added salt.  Eat Less Often/Avoid: Salted nuts and seeds, canned beans with added salt. Fats and Sweets (limited)  Eat More Often: Vegetable oils, tub margarines without trans fats, sugar-free gelatin. Mayonnaise and salad dressings.  Eat Less Often/Avoid: Coconut oils, palm oils, butter, stick margarine, cream, half and half, cookies, candy, pie. FOR MORE INFORMATION The Dash Diet Eating Plan: www.dashdiet.org Document Released: 03/30/2011 Document Revised: 07/03/2011 Document Reviewed: 03/30/2011 [facility] Patient Information 2014 [place], [place].

## 2013-06-25 NOTE — Progress Notes (Signed)
Pre visit review using our clinic review tool, if applicable. No additional management support is needed unless otherwise documented below in the visit note/SLS  

## 2013-06-25 NOTE — Assessment & Plan Note (Signed)
Uncontrolled.  Asymptomatic. Restart medication.  DASH diet given.  Will check BMP and TSH.  Follow-up in 3-4 weeks for BP recheck and medication management.

## 2013-06-25 NOTE — Progress Notes (Signed)
Patient presents to clinic today for medication management.  Patient is out of her blood pressure medications.  Has been out of medication for several months.  Patient is overdue for an appointment with her PCP.  Patient's BP elevated at 134/102 in clinic today.  Patient denies chest pain, palpitations, SOB, frequent headaches, LH/Dizziness.  Does endorse some ocassional blurring of vision. Has never had TSH checked.  Patient also requesting labs to assess for diabetes.  Patient endorses extensive family history of diabetes.  Patient's previous glucose levels have all been within normal limits.  Patient denies polyuria, nocturia, polydipsia, polyphagia.  Patient is overweight but not obese.  Past Medical History  Diagnosis Date  . Hypertension   . Heart murmur   . Anemia     No current outpatient prescriptions on file prior to visit.   No current facility-administered medications on file prior to visit.    No Known Allergies  Family History  Problem Relation Age of Onset  . Arthritis Mother   . Osteoporosis Mother   . Diabetes Maternal Grandmother     History   Social History  . Marital Status: Legally Separated    Spouse Name: N/A    Number of Children: 2  . Years of Education: N/A   Occupational History  .  [employer]   Social History Main Topics  . Smoking status: Never Smoker   . Smokeless tobacco: Never Used  . Alcohol Use: 1.8 oz/week    3 Cans of beer per week  . Drug Use: No  . Sexual Activity: None   Other Topics Concern  . 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  All other ROS are negative.  BP 134/102  Pulse 77  Temp(Src) 98 F (36.7 C) (Oral)  Resp 14  Ht 5\' 4"  (1.626 m)  Wt 160 lb (72.576 kg)  BMI 27.45 kg/m2  SpO2 96%  Physical Exam  Vitals reviewed. Constitutional:  She is oriented to person, place, and time and well-developed, well-nourished, and in no distress.  HENT:  Head: Normocephalic and atraumatic.  Eyes: Conjunctivae and EOM are normal. Pupils are equal, round, and reactive to light.  Neck: Neck supple. No thyromegaly present.  Cardiovascular: Normal rate, regular rhythm, normal heart sounds and intact distal pulses.   Pulmonary/Chest: Effort normal and breath sounds normal. No respiratory distress. She has no wheezes. She has no rales. She exhibits no tenderness.  Neurological: She is alert and oriented to person, place, and time.  Skin: Skin is warm and dry. No rash noted.  Psychiatric: Affect normal.    No results found for this or any previous visit (from the past 2160 hour(s)).  Assessment/Plan: HTN (hypertension) Uncontrolled.  Asymptomatic. Restart medication.  DASH diet given.  Will check BMP and TSH.  Follow-up in 3-4 weeks for BP recheck and medication management.  Family history of diabetes mellitus Due to patient's concerns, will obtain BMP and A1C.  Patient without clinical symptoms of hyperglycemia.

## 2013-06-25 NOTE — Telephone Encounter (Signed)
Relevant patient education assigned to patient using Emmi. ° °

## 2013-06-25 NOTE — Assessment & Plan Note (Signed)
Due to patient's concerns, will obtain BMP and A1C.  Patient without clinical symptoms of hyperglycemia.

## 2013-06-27 ENCOUNTER — Ambulatory Visit: Payer: BC Managed Care – PPO | Admitting: Physician Assistant

## 2013-07-09 ENCOUNTER — Encounter: Payer: Self-pay | Admitting: *Deleted

## 2013-08-01 ENCOUNTER — Encounter: Payer: Self-pay | Admitting: Physician Assistant

## 2013-08-01 ENCOUNTER — Ambulatory Visit: Payer: BC Managed Care – PPO | Admitting: Physician Assistant

## 2013-08-01 ENCOUNTER — Ambulatory Visit (INDEPENDENT_AMBULATORY_CARE_PROVIDER_SITE_OTHER): Payer: BC Managed Care – PPO | Admitting: Physician Assistant

## 2013-08-01 VITALS — BP 130/84 | HR 98 | Temp 98.5°F | Resp 16 | Ht 64.0 in | Wt 158.0 lb

## 2013-08-01 DIAGNOSIS — I10 Essential (primary) hypertension: Secondary | ICD-10-CM

## 2013-08-01 DIAGNOSIS — H60399 Other infective otitis externa, unspecified ear: Secondary | ICD-10-CM

## 2013-08-01 LAB — BASIC METABOLIC PANEL
BUN: 12 mg/dL (ref 6–23)
CHLORIDE: 100 meq/L (ref 96–112)
CO2: 27 mEq/L (ref 19–32)
Calcium: 10.1 mg/dL (ref 8.4–10.5)
Creat: 0.88 mg/dL (ref 0.50–1.10)
GLUCOSE: 106 mg/dL — AB (ref 70–99)
POTASSIUM: 3.8 meq/L (ref 3.5–5.3)
SODIUM: 137 meq/L (ref 135–145)

## 2013-08-01 MED ORDER — CIPROFLOXACIN-DEXAMETHASONE 0.3-0.1 % OT SUSP: 4.0000 [drp] | Freq: Two times a day (BID) | OTIC | Status: DC

## 2013-08-01 NOTE — Progress Notes (Signed)
Patient presents to clinic today for follow-up of Hypertension.  At last visit patient patient's medication was restarted.  Patient endorses taking medication ecery day as directed.  Denies chest pain, palpitations, SOB, lightheadedness, frequent headache or vision changes.  BP is normotensive today in clinic, much improved from previous BP.  Patient also complains of L ear pain and fullness.  Denies fever, chills, sinus pressure or sinus pain.  Denies tinnitus.  Endorses some mild hearing loss.  Past Medical History  Diagnosis Date  . Hypertension   . Heart murmur   . Anemia     Current Outpatient Prescriptions on File Prior to Visit  Medication Sig Dispense Refill  . enalapril-hydrochlorothiazide (VASERETIC) 10-25 MG per tablet TAKE 1 TABLET BY MOUTH EVERY DAY  90 tablet  0   No current facility-administered medications on file prior to visit.    No Known Allergies  Family History  Problem Relation Age of Onset  . Arthritis Mother   . Osteoporosis Mother   . Diabetes Maternal Grandmother     History   Social History  . Marital Status: Legally Separated    Spouse Name: N/A    Number of Children: 2  . Years of Education: N/A   Occupational History  .  [place]   Social History Main Topics  . Smoking status: Never Smoker   . Smokeless tobacco: Never Used  . Alcohol Use: 1.8 oz/week    3 Cans of beer per week  . Drug Use: No  . Sexual Activity: None   Other Topics Concern  . None   Social History Narrative   Has 42 yr old and 42 year old   Works as Dance movement psychotherapist at [employer]   Separated   She completed 2 years college   Enjoys entertaining friends    Review of Systems - See HPI.  All other ROS are negative.  BP 130/84  Pulse 98  Temp(Src) 98.5 F (36.9 C) (Oral)  Resp 16  Ht 5\' 4"  (1.626 m)  Wt 158 lb (71.668 kg)  BMI 27.11 kg/m2  SpO2 98%  Physical Exam  Vitals reviewed. Constitutional: She is oriented to person, place, and time and  well-developed, well-nourished, and in no distress.  HENT:  Head: Normocephalic and atraumatic.  Right Ear: Tympanic membrane, external ear and ear canal normal.  Left Ear: Tympanic membrane and external ear normal.  Nose: Nose normal.  Mouth/Throat: Oropharynx is clear and moist. No oropharyngeal exudate.  Left ear canal erythematous and swollen.  Eyes: Conjunctivae are normal. Pupils are equal, round, and reactive to light.  Neck: Neck supple.  Cardiovascular: Normal rate, regular rhythm, normal heart sounds and intact distal pulses.   Pulmonary/Chest: Effort normal and breath sounds normal. No respiratory distress. She has no wheezes. She has no rales. She exhibits no tenderness.  Neurological: She is alert and oriented to person, place, and time.  Skin: Skin is warm and dry. No rash noted.  Psychiatric: Affect normal.    Recent Results (from the past 2160 hour(s))  BASIC METABOLIC PANEL     Status: Abnormal   Collection Time    06/25/13  3:04 PM      Result Value Ref Range   Sodium 134 (*) 135 - 145 mEq/L   Potassium 4.2  3.5 - 5.3 mEq/L   Chloride 102  96 - 112 mEq/L   CO2 25  19 - 32 mEq/L   Glucose, Bld 79  70 - 99 mg/dL  BUN 16  6 - 23 mg/dL   Creat 0.82  0.50 - 1.10 mg/dL   Calcium 9.5  8.4 - 10.5 mg/dL  HEMOGLOBIN A1C     Status: None   Collection Time    06/25/13  3:04 PM      Result Value Ref Range   Hemoglobin A1C 5.5  <5.7 %   Comment:                                                                            According to the ADA Clinical Practice Recommendations for 2011, when     HbA1c is used as a screening test:             >=6.5%   Diagnostic of Diabetes Mellitus                (if abnormal result is confirmed)           5.7-6.4%   Increased risk of developing Diabetes Mellitus           References:Diagnosis and Classification of Diabetes Mellitus,Diabetes     WFUX,3235,57(DUKGU 1):S62-S69 and Standards of Medical Care in             Diabetes -  2011,Diabetes RKYH,0623,76 (Suppl 1):S11-S61.         Mean Plasma Glucose 111  <117 mg/dL  TSH     Status: None   Collection Time    06/25/13  3:04 PM      Result Value Ref Range   TSH 1.656  0.350 - 4.500 uIU/mL    Assessment/Plan: HTN (hypertension) BP much improved.  Continue current regimen.  Follow-up in 3 months.  Otitis, externa, infective Rx Ciprodex. STOP OTC ear wax drops.

## 2013-08-01 NOTE — Progress Notes (Signed)
Pre visit review using our clinic review tool, if applicable. No additional management support is needed unless otherwise documented below in the visit note/SLS  

## 2013-08-01 NOTE — Assessment & Plan Note (Signed)
Rx Ciprodex. STOP OTC ear wax drops.

## 2013-08-01 NOTE — Assessment & Plan Note (Signed)
BP much improved.  Continue current regimen.  Follow-up in 3 months.

## 2013-08-01 NOTE — Patient Instructions (Signed)
For Blood Pressure -- Please obtain labs.  Continue the medication daily as directed.  Continue with diet and exercise.  Follow-up in 3 months.  For Ear Infection -- Use Ciprodex drops as directed in the left ear for 1 week.  Stop use of the over-the-counter wax removal drops.  Follow-up with your Audiologist.  Hypertension Hypertension is another name for high blood pressure. High blood pressure may mean that your heart needs to work harder to pump blood. Blood pressure consists of two numbers, which includes a higher number over a lower number (example: 110/72). HOME CARE   Make lifestyle changes as told by your doctor. This may include weight loss and exercise.  Take your blood pressure medicine every day.  Limit how much salt you use.  Stop smoking if you smoke.  Do not use drugs.  Talk to your doctor if you are using decongestants or birth control pills. These medicines might make blood pressure higher.  Females should not drink more than 1 alcoholic drink per day. Males should not drink more than 2 alcoholic drinks per day.  See your doctor as told. GET HELP RIGHT AWAY IF:   You have a blood pressure reading with a top number of 180 or higher.  You get a very bad headache.  You get blurred or changing vision.  You feel confused.  You feel weak, numb, or faint.  You get chest or belly (abdominal) pain.  You throw up (vomit).  You cannot breathe very well. MAKE SURE YOU:   Understand these instructions.  Will watch your condition.  Will get help right away if you are not doing well or get worse. Document Released: 09/27/2007 Document Revised: 07/03/2011 Document Reviewed: 09/27/2007 [facility] Patient Information 2014 [place], [place].

## 2013-08-05 ENCOUNTER — Telehealth: Payer: Self-pay | Admitting: *Deleted

## 2013-08-05 MED ORDER — NEOMYCIN-POLYMYXIN-HC 3.5-10000-1 OT SOLN: 3.0000 [drp] | Freq: Three times a day (TID) | OTIC | Status: DC

## 2013-08-05 NOTE — Telephone Encounter (Signed)
Notified pt. 

## 2013-08-05 NOTE — Telephone Encounter (Signed)
Pt left message to call her re: ear drop rx. Spoke with pt she states that her insurance will not cover ciprodex because it is not generic. Pt is asking for Rx that is a generic.  Please advise.

## 2013-08-05 NOTE — Telephone Encounter (Signed)
rx sent for cortisporin otic instead.

## 2013-08-07 ENCOUNTER — Telehealth: Payer: Self-pay | Admitting: Physician Assistant

## 2013-08-07 DIAGNOSIS — H9209 Otalgia, unspecified ear: Secondary | ICD-10-CM

## 2013-08-07 NOTE — Telephone Encounter (Signed)
referral granted.

## 2013-08-07 NOTE — Telephone Encounter (Signed)
Requesting a referral to ENT

## 2013-09-26 ENCOUNTER — Other Ambulatory Visit: Payer: Self-pay | Admitting: Obstetrics and Gynecology

## 2013-12-04 ENCOUNTER — Telehealth: Payer: Self-pay | Admitting: *Deleted

## 2013-12-04 MED ORDER — ENALAPRIL-HYDROCHLOROTHIAZIDE 10-25 MG PO TABS
ORAL_TABLET | ORAL | Status: DC
Start: 2013-12-04 — End: 2014-06-05

## 2013-12-04 NOTE — Telephone Encounter (Signed)
Rx request to pharmacy/SLS  

## 2014-01-05 ENCOUNTER — Encounter: Payer: Self-pay | Admitting: Physician Assistant

## 2014-01-05 ENCOUNTER — Ambulatory Visit (INDEPENDENT_AMBULATORY_CARE_PROVIDER_SITE_OTHER): Payer: BC Managed Care – PPO | Admitting: Physician Assistant

## 2014-01-05 VITALS — BP 144/96 | HR 75 | Temp 98.5°F | Resp 16 | Ht 64.0 in | Wt 159.5 lb

## 2014-01-05 DIAGNOSIS — M25562 Pain in left knee: Secondary | ICD-10-CM | POA: Insufficient documentation

## 2014-01-05 DIAGNOSIS — M25569 Pain in unspecified knee: Secondary | ICD-10-CM

## 2014-01-05 MED ORDER — MELOXICAM 15 MG PO TABS: 15.0000 mg | ORAL_TABLET | Freq: Every day | ORAL | Status: DC

## 2014-01-05 NOTE — Assessment & Plan Note (Signed)
Rx Mobic daily.  Tylenol can be used later in the day for breakthrough pain.  RICE therapy. Knee brace to be worn daily.  Limit activity over the next couple of days then ease back into normal activity.  If no improvement over the next week, will obtain Xray to further assess.

## 2014-01-05 NOTE — Progress Notes (Signed)
Patient presents to clinic today c/o pain of medial aspect of left knee x 1 week. Denies trauma or injury. Denies redness, warmth or swelling.  Denies decreased ROM.  Denies history of injury to the knee.  Pain is throbbing and does not radiate.  Advil helps somewhat with pain.  Past Medical History  Diagnosis Date  . Hypertension   . Heart murmur   . Anemia     Current Outpatient Prescriptions on File Prior to Visit  Medication Sig Dispense Refill  . enalapril-hydrochlorothiazide (VASERETIC) 10-25 MG per tablet TAKE 1 TABLET BY MOUTH EVERY DAY  90 tablet  0   No current facility-administered medications on file prior to visit.    No Known Allergies  Family History  Problem Relation Age of Onset  . Arthritis Mother   . Osteoporosis Mother   . Diabetes Maternal Grandmother     History   Social History  . Marital Status: Legally Separated    Spouse Name: N/A    Number of Children: 2  . Years of Education: N/A   Occupational History  .  [place]   Social History Main Topics  . Smoking status: Never Smoker   . Smokeless tobacco: Never Used  . Alcohol Use: 1.8 oz/week    3 Cans of beer per week  . Drug Use: No  . Sexual Activity: None   Other Topics Concern  . 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  All other ROS are negative.  BP 144/96  Pulse 75  Temp(Src) 98.5 F (36.9 C) (Oral)  Resp 16  Ht 5\' 4"  (1.626 m)  Wt 159 lb 8 oz (72.349 kg)  BMI 27.36 kg/m2  SpO2 100%  Physical Exam  Vitals reviewed. Constitutional: She is oriented to person, place, and time and well-developed, well-nourished, and in no distress.  HENT:  Head: Normocephalic and atraumatic.  Cardiovascular: Normal rate, regular rhythm, normal heart sounds and intact distal pulses.   Pulmonary/Chest: Effort normal and  breath sounds normal. No respiratory distress. She has no wheezes. She has no rales. She exhibits no tenderness.  Musculoskeletal:       Left knee: She exhibits normal range of motion, no swelling, no effusion, no erythema, normal alignment, no LCL laxity, normal patellar mobility, no bony tenderness and no MCL laxity. Tenderness found. Medial joint line tenderness noted. No lateral joint line, no MCL, no LCL and no patellar tendon tenderness noted.  Neurological: She is alert and oriented to person, place, and time.  Skin: Skin is warm and dry. No rash noted.  Psychiatric: Affect normal.   Assessment/Plan: Knee pain, left Rx Mobic daily.  Tylenol can be used later in the day for breakthrough pain.  RICE therapy. Knee brace to be worn daily.  Limit activity over the next couple of days then ease back into normal activity.  If no improvement over the next week, will obtain Xray to further assess.

## 2014-01-05 NOTE — Progress Notes (Signed)
Pre visit review using our clinic review tool, if applicable. No additional management support is needed unless otherwise documented below in the visit note/SLS  

## 2014-01-05 NOTE — Patient Instructions (Signed)
Take Mobic daily with food as directed. Tylenol for additional pain relief throughout the day. Apply topical aspercreme to the knee.  Elevate the leg.  Alternate between heat and ice.  If symptoms are not improving by Friday please give me a call as we may need to proceed with X-ray.

## 2014-06-05 ENCOUNTER — Other Ambulatory Visit: Payer: Self-pay | Admitting: Physician Assistant

## 2014-06-05 NOTE — Telephone Encounter (Signed)
Rx request to pharmacy/SLS Requested drug refills are authorized, however, the patient needs further evaluation and/or laboratory testing before further refills are given. Ask her to make an appointment for this.   Please call patient and arrange overdue Follow-up appointment per provider instructions, prior to future refill authorizations/SLS Thanks.

## 2014-08-04 ENCOUNTER — Ambulatory Visit: Payer: Self-pay | Admitting: Physician Assistant

## 2014-08-12 ENCOUNTER — Ambulatory Visit (INDEPENDENT_AMBULATORY_CARE_PROVIDER_SITE_OTHER): Payer: BLUE CROSS/BLUE SHIELD | Admitting: Physician Assistant

## 2014-08-12 ENCOUNTER — Encounter: Payer: Self-pay | Admitting: Physician Assistant

## 2014-08-12 VITALS — BP 127/88 | HR 73 | Temp 98.6°F | Resp 16 | Ht 64.0 in | Wt 153.2 lb

## 2014-08-12 DIAGNOSIS — K5900 Constipation, unspecified: Secondary | ICD-10-CM

## 2014-08-12 DIAGNOSIS — I10 Essential (primary) hypertension: Secondary | ICD-10-CM

## 2014-08-12 DIAGNOSIS — Z23 Encounter for immunization: Secondary | ICD-10-CM | POA: Insufficient documentation

## 2014-08-12 DIAGNOSIS — K5904 Chronic idiopathic constipation: Secondary | ICD-10-CM | POA: Insufficient documentation

## 2014-08-12 MED ORDER — LUBIPROSTONE 24 MCG PO CAPS: 24.0000 ug | ORAL_CAPSULE | Freq: Two times a day (BID) | ORAL | Status: DC

## 2014-08-12 MED ORDER — ENALAPRIL-HYDROCHLOROTHIAZIDE 10-25 MG PO TABS: 1.0000 | ORAL_TABLET | Freq: Every day | ORAL | Status: DC

## 2014-08-12 NOTE — Assessment & Plan Note (Signed)
Will begin Amitiza.  Voucher given. Continue diet and exercise. Will follow-up in 1 month.

## 2014-08-12 NOTE — Progress Notes (Signed)
Pre visit review using our clinic review tool, if applicable. No additional management support is needed unless otherwise documented below in the visit note/SLS  

## 2014-08-12 NOTE — Assessment & Plan Note (Signed)
Stable. Asymptomatic.  Continue current regimen.  Medications refilled.  Continue diet and exercsie.  Follow-up in 6 months.

## 2014-08-12 NOTE — Patient Instructions (Signed)
Please continue medications as directed. Stay well hydrated.  Continue diet and exercise.  Follow-up in 6 months for hypertension.  Hypertension Hypertension, commonly called high blood pressure, is when the force of blood pumping through your arteries is too strong. Your arteries are the blood vessels that carry blood from your heart throughout your body. A blood pressure reading consists of a higher number over a lower number, such as 110/72. The higher number (systolic) is the pressure inside your arteries when your heart pumps. The lower number (diastolic) is the pressure inside your arteries when your heart relaxes. Ideally you want your blood pressure below 120/80. Hypertension forces your heart to work harder to pump blood. Your arteries may become narrow or stiff. Having hypertension puts you at risk for heart disease, stroke, and other problems.  RISK FACTORS Some risk factors for high blood pressure are controllable. Others are not.  Risk factors you cannot control include:   Race. You may be at higher risk if you are African American.  Age. Risk increases with age.  Gender. Men are at higher risk than women before age 80 years. After age 70, women are at higher risk than men. Risk factors you can control include:  Not getting enough exercise or physical activity.  Being overweight.  Getting too much fat, sugar, calories, or salt in your diet.  Drinking too much alcohol. SIGNS AND SYMPTOMS Hypertension does not usually cause signs or symptoms. Extremely high blood pressure (hypertensive crisis) may cause headache, anxiety, shortness of breath, and nosebleed. DIAGNOSIS  To check if you have hypertension, your health care provider will measure your blood pressure while you are seated, with your arm held at the level of your heart. It should be measured at least twice using the same arm. Certain conditions can cause a difference in blood pressure between your right and left arms.  A blood pressure reading that is higher than normal on one occasion does not mean that you need treatment. If one blood pressure reading is high, ask your health care provider about having it checked again. TREATMENT  Treating high blood pressure includes making lifestyle changes and possibly taking medicine. Living a healthy lifestyle can help lower high blood pressure. You may need to change some of your habits. Lifestyle changes may include:  Following the DASH diet. This diet is high in fruits, vegetables, and whole grains. It is low in salt, red meat, and added sugars.  Getting at least 2 hours of brisk physical activity every week.  Losing weight if necessary.  Not smoking.  Limiting alcoholic beverages.  Learning ways to reduce stress. If lifestyle changes are not enough to get your blood pressure under control, your health care provider may prescribe medicine. You may need to take more than one. Work closely with your health care provider to understand the risks and benefits. HOME CARE INSTRUCTIONS  Have your blood pressure rechecked as directed by your health care provider.   Take medicines only as directed by your health care provider. Follow the directions carefully. Blood pressure medicines must be taken as prescribed. The medicine does not work as well when you skip doses. Skipping doses also puts you at risk for problems.   Do not smoke.   Monitor your blood pressure at home as directed by your health care provider. SEEK MEDICAL CARE IF:   You think you are having a reaction to medicines taken.  You have recurrent headaches or feel dizzy.  You have swelling in your  ankles.  You have trouble with your vision. SEEK IMMEDIATE MEDICAL CARE IF:  You develop a severe headache or confusion.  You have unusual weakness, numbness, or feel faint.  You have severe chest or abdominal pain.  You vomit repeatedly.  You have trouble breathing. MAKE SURE YOU:    Understand these instructions.  Will watch your condition.  Will get help right away if you are not doing well or get worse. Document Released: 04/10/2005 Document Revised: 08/25/2013 Document Reviewed: 01/31/2013 [facility] Patient Information 2015 [place], [place]. This information is not intended to replace advice given to you by your health care provider. Make sure you discuss any questions you have with your health care provider.

## 2014-08-12 NOTE — Progress Notes (Addendum)
    Patient presents to clinic today for follow-up of hypertension. Patient endorses taking medications as directed.  Endorses just starting a low salt diet 2 days ago.  Is starting to exercise. Patient denies chest pain, palpitations, lightheadedness, dizziness, vision changes or frequent headaches.  Patient also endorses chronic constipation over the past several years, not responsive to OTC laxatives. Is taking stool softener that help somewhat.  Patient without previous diagnosis of diabetes.  Denies nausea or vomiting.  Denies reflux symptoms.   Past Medical History  Diagnosis Date  . Hypertension   . Heart murmur   . Anemia     Current Outpatient Prescriptions on File Prior to Visit  Medication Sig Dispense Refill  . ibuprofen (ADVIL,MOTRIN) 200 MG tablet Take 200 mg by mouth every 6 (six) hours as needed.     No current facility-administered medications on file prior to visit.    No Known Allergies  Family History  Problem Relation Age of Onset  . Arthritis Mother   . Osteoporosis Mother   . Diabetes Maternal Grandmother     History   Social History  . Marital Status: Legally Separated    Spouse Name: N/A  . Number of Children: 2  . Years of Education: N/A   Occupational History  .  [employer]   Social History Main Topics  . Smoking status: Never Smoker   . Smokeless tobacco: Never Used  . Alcohol Use: 1.8 oz/week    3 Cans of beer per week  . Drug Use: No  . Sexual Activity: Not on file   Other Topics Concern  . 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  All other ROS are negative.  BP 127/88 mmHg  Pulse 73  Temp(Src) 98.6 F (37 C) (Oral)  Resp 16  Ht 5\' 4"  (1.626 m)  Wt 153 lb 4 oz (69.514 kg)  BMI 26.29 kg/m2  SpO2 100%  Physical Exam  Constitutional: She is oriented to  person, place, and time and well-developed, well-nourished, and in no distress.  HENT:  Head: Normocephalic and atraumatic.  Eyes: Conjunctivae are normal.  Neck: Neck supple.  Cardiovascular: Normal rate, regular rhythm, normal heart sounds and intact distal pulses.   Pulmonary/Chest: Effort normal and breath sounds normal. No respiratory distress. She has no wheezes. She has no rales. She exhibits no tenderness.  Neurological: She is alert and oriented to person, place, and time.  Skin: Skin is warm and dry. No rash noted.  Psychiatric: Affect normal.  Vitals reviewed.  Assessment/Plan: HTN (hypertension) Stable. Asymptomatic.  Continue current regimen.  Medications refilled.  Continue diet and exercsie.  Follow-up in 6 months.   Chronic idiopathic constipation Will begin Amitiza.  Voucher given. Continue diet and exercise. Will follow-up in 1 month.

## 2014-08-12 NOTE — Addendum Note (Signed)
Addended by: Raiford Noble on: 08/12/2014 10:15 AM   Modules accepted: Orders

## 2014-09-06 LAB — HM PAP SMEAR: HM PAP: NORMAL

## 2014-10-05 ENCOUNTER — Ambulatory Visit (INDEPENDENT_AMBULATORY_CARE_PROVIDER_SITE_OTHER): Payer: BLUE CROSS/BLUE SHIELD | Admitting: Physician Assistant

## 2014-10-05 ENCOUNTER — Encounter: Payer: Self-pay | Admitting: Physician Assistant

## 2014-10-05 VITALS — BP 119/70 | HR 81 | Temp 98.8°F | Resp 16 | Ht 64.0 in | Wt 153.5 lb

## 2014-10-05 DIAGNOSIS — K5900 Constipation, unspecified: Secondary | ICD-10-CM | POA: Diagnosis not present

## 2014-10-05 DIAGNOSIS — K5904 Chronic idiopathic constipation: Secondary | ICD-10-CM

## 2014-10-05 DIAGNOSIS — J020 Streptococcal pharyngitis: Secondary | ICD-10-CM | POA: Insufficient documentation

## 2014-10-05 LAB — POCT RAPID STREP A (OFFICE): Rapid Strep A Screen: POSITIVE — AB

## 2014-10-05 MED ORDER — LUBIPROSTONE 24 MCG PO CAPS: 24.0000 ug | ORAL_CAPSULE | Freq: Two times a day (BID) | ORAL | Status: DC

## 2014-10-05 MED ORDER — AMOXICILLIN 500 MG PO TABS: 500.0000 mg | ORAL_TABLET | Freq: Two times a day (BID) | ORAL | Status: DC

## 2014-10-05 NOTE — Patient Instructions (Addendum)
Please take your antibiotic as directed with food. Stay well hydrated. Alternate tylenol and ibuprofen as needed for sore throat or fever. Salt-water gargles will also be beneficial. Switch out your toothbrush in a couple of days to prevent reinfection. Follow-up if symptoms are not improving.  Strep Throat Strep throat is an infection of the throat caused by a bacteria named Streptococcus pyogenes. Your health care provider may call the infection streptococcal "tonsillitis" or "pharyngitis" depending on whether there are signs of inflammation in the tonsils or back of the throat. Strep throat is most common in children aged 5-15 years during the cold months of the year, but it can occur in people of any age during any season. This infection is spread from person to person (contagious) through coughing, sneezing, or other close contact. SIGNS AND SYMPTOMS   Fever or chills.  Painful, swollen, red tonsils or throat.  Pain or difficulty when swallowing.  White or yellow spots on the tonsils or throat.  Swollen, tender lymph nodes or "glands" of the neck or under the jaw.  Red rash all over the body (rare). DIAGNOSIS  Many different infections can cause the same symptoms. A test must be done to confirm the diagnosis so the right treatment can be given. A "rapid strep test" can help your health care provider make the diagnosis in a few minutes. If this test is not available, a light swab of the infected area can be used for a throat culture test. If a throat culture test is done, results are usually available in a day or two. TREATMENT  Strep throat is treated with antibiotic medicine. HOME CARE INSTRUCTIONS   Gargle with 1 tsp of salt in 1 cup of warm water, 3-4 times per day or as needed for comfort.  Family members who also have a sore throat or fever should be tested for strep throat and treated with antibiotics if they have the strep infection.  Make sure everyone in your household  washes their hands well.  Do not share food, drinking cups, or personal items that could cause the infection to spread to others.  You may need to eat a soft food diet until your sore throat gets better.  Drink enough water and fluids to keep your urine clear or pale yellow. This will help prevent dehydration.  Get plenty of rest.  Stay home from school, day care, or work until you have been on antibiotics for 24 hours.  Take medicines only as directed by your health care provider.  Take your antibiotic medicine as directed by your health care provider. Finish it even if you start to feel better. SEEK MEDICAL CARE IF:   The glands in your neck continue to enlarge.  You develop a rash, cough, or earache.  You cough up green, yellow-brown, or bloody sputum.  You have pain or discomfort not controlled by medicines.  Your problems seem to be getting worse rather than better.  You have a fever. SEEK IMMEDIATE MEDICAL CARE IF:   You develop any new symptoms such as vomiting, severe headache, stiff or painful neck, chest pain, shortness of breath, or trouble swallowing.  You develop severe throat pain, drooling, or changes in your voice.  You develop swelling of the neck, or the skin on the neck becomes red and tender.  You develop signs of dehydration, such as fatigue, dry mouth, and decreased urination.  You become increasingly sleepy, or you cannot wake up completely. MAKE SURE YOU:  Understand these instructions.  Will watch your condition.  Will get help right away if you are not doing well or get worse. Document Released: 04/07/2000 Document Revised: 08/25/2013 Document Reviewed: 06/09/2010 [facility] Patient Information 2015 [place], [place]. This information is not intended to replace advice given to you by your health care provider. Make sure you discuss any questions you have with your health care provider.

## 2014-10-05 NOTE — Addendum Note (Signed)
Addended by: Rockwell Germany on: 10/05/2014 04:36 PM   Modules accepted: Orders

## 2014-10-05 NOTE — Progress Notes (Signed)
   Patient presents to clinic today c/o 1 day of sore throat with odynophagia, chills and headache.  Patient endorses mild fatigue.  Denies chest pain or shortness of breath.  Denies fever, recent travel or sick contact. Has not taken anything for symptoms.   Past Medical History  Diagnosis Date  . Hypertension   . Heart murmur   . Anemia     Current Outpatient Prescriptions on File Prior to Visit  Medication Sig Dispense Refill  . enalapril-hydrochlorothiazide (VASERETIC) 10-25 MG per tablet Take 1 tablet by mouth daily. 90 tablet 1  . ibuprofen (ADVIL,MOTRIN) 200 MG tablet Take 200 mg by mouth every 6 (six) hours as needed.     No current facility-administered medications on file prior to visit.    No Known Allergies  Family History  Problem Relation Age of Onset  . Arthritis Mother   . Osteoporosis Mother   . Diabetes Maternal Grandmother     History   Social History  . Marital Status: Legally Separated    Spouse Name: N/A  . Number of Children: 2  . Years of Education: N/A   Occupational History  .  [place]   Social History Main Topics  . Smoking status: Never Smoker   . Smokeless tobacco: Never Used  . Alcohol Use: 1.8 oz/week    3 Cans of beer per week  . Drug Use: No  . Sexual Activity: Not on file   Other Topics Concern  . 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  All other ROS are negative.  BP 119/70 mmHg  Pulse 81  Temp(Src) 98.8 F (37.1 C) (Oral)  Resp 16  Ht 5\' 4"  (1.626 m)  Wt 153 lb 8 oz (69.627 kg)  BMI 26.34 kg/m2  SpO2 100%  Physical Exam  Constitutional: She is oriented to person, place, and time and well-developed, well-nourished, and in no distress.  HENT:  Head: Normocephalic and atraumatic.  Right Ear: Tympanic membrane, external ear and ear canal  normal.  Left Ear: Tympanic membrane, external ear and ear canal normal.  Nose: Nose normal.  Mouth/Throat: Uvula is midline and mucous membranes are normal. Posterior oropharyngeal edema and posterior oropharyngeal erythema present. No oropharyngeal exudate.  Eyes: Conjunctivae are normal.  Neck: Neck supple.  Cardiovascular: Normal rate, regular rhythm, normal heart sounds and intact distal pulses.   Pulmonary/Chest: Effort normal and breath sounds normal. No respiratory distress. She has no wheezes. She has no rales. She exhibits no tenderness.  Lymphadenopathy:    She has cervical adenopathy.  Neurological: She is alert and oriented to person, place, and time.  Skin: Skin is warm and dry. No rash noted.  Psychiatric: Affect normal.  Vitals reviewed.  Assessment/Plan: Strep pharyngitis Rapid strep positive. Rx Amoxicillin to take with food.  Supportive measures reviewed.  Follow-up PRN.

## 2014-10-05 NOTE — Progress Notes (Signed)
Pre visit review using our clinic review tool, if applicable. No additional management support is needed unless otherwise documented below in the visit note/SLS  

## 2014-10-05 NOTE — Assessment & Plan Note (Signed)
Rapid strep positive. Rx Amoxicillin to take with food.  Supportive measures reviewed.  Follow-up PRN.

## 2015-01-26 ENCOUNTER — Ambulatory Visit (INDEPENDENT_AMBULATORY_CARE_PROVIDER_SITE_OTHER): Payer: BLUE CROSS/BLUE SHIELD | Admitting: Physician Assistant

## 2015-01-26 ENCOUNTER — Encounter: Payer: Self-pay | Admitting: Physician Assistant

## 2015-01-26 VITALS — BP 158/100 | HR 63 | Temp 98.0°F | Resp 16 | Ht 64.0 in | Wt 162.2 lb

## 2015-01-26 DIAGNOSIS — Z833 Family history of diabetes mellitus: Secondary | ICD-10-CM

## 2015-01-26 DIAGNOSIS — K5904 Chronic idiopathic constipation: Secondary | ICD-10-CM

## 2015-01-26 DIAGNOSIS — K420 Umbilical hernia with obstruction, without gangrene: Secondary | ICD-10-CM | POA: Diagnosis not present

## 2015-01-26 DIAGNOSIS — I10 Essential (primary) hypertension: Secondary | ICD-10-CM

## 2015-01-26 LAB — BASIC METABOLIC PANEL
BUN: 9 mg/dL (ref 6–23)
CHLORIDE: 106 meq/L (ref 96–112)
CO2: 26 mEq/L (ref 19–32)
Calcium: 9.2 mg/dL (ref 8.4–10.5)
Creatinine, Ser: 0.72 mg/dL (ref 0.40–1.20)
GFR: 93.81 mL/min (ref >60.00)
Glucose, Bld: 90 mg/dL (ref 70–99)
POTASSIUM: 4.2 meq/L (ref 3.5–5.1)
SODIUM: 138 meq/L (ref 135–145)

## 2015-01-26 LAB — URINALYSIS, ROUTINE W REFLEX MICROSCOPIC
BILIRUBIN URINE: NEGATIVE
Hgb urine dipstick: NEGATIVE
KETONES UR: NEGATIVE
LEUKOCYTES UA: NEGATIVE
Nitrite: NEGATIVE
PH: 6 (ref 5.0–8.0)
RBC / HPF: NONE SEEN (ref 0–?)
SPECIFIC GRAVITY, URINE: 1.025 (ref 1.000–1.030)
Total Protein, Urine: NEGATIVE
Urine Glucose: NEGATIVE
Urobilinogen, UA: 0.2 (ref 0.0–1.0)
WBC UA: NONE SEEN (ref 0–?)

## 2015-01-26 LAB — HEMOGLOBIN A1C: HEMOGLOBIN A1C: 5.8 % (ref 4.6–6.5)

## 2015-01-26 NOTE — Assessment & Plan Note (Signed)
Will check UA and BMP. Will repeat A1C today.

## 2015-01-26 NOTE — Progress Notes (Signed)
Patient presents to clinic today to request repeat assessment for DM giving her + family history of Diabetes.  Son was recently diagnosed as a prediabetic. Patient denies symptoms of DM.  Patient also requesting referral to general surgery for her umbilical hernia, previously asymptomatic. Endorses some rare tenderness at site occuring maybe once a month when she presses on the area.  Patient with history of hypertension, currently on Vaseretic daily. BP noted to be elevated today. Patient denies chest pain, palpitations, lightheadedness, dizziness, vision changes or frequent headaches. Patient has not taken her BP medication in several days.  Past Medical History  Diagnosis Date  . Hypertension   . Heart murmur   . Anemia     Current Outpatient Prescriptions on File Prior to Visit  Medication Sig Dispense Refill  . enalapril-hydrochlorothiazide (VASERETIC) 10-25 MG per tablet Take 1 tablet by mouth daily. 90 tablet 1  . ibuprofen (ADVIL,MOTRIN) 200 MG tablet Take 200 mg by mouth every 6 (six) hours as needed.    . lubiprostone (AMITIZA) 24 MCG capsule Take 1 capsule (24 mcg total) by mouth 2 (two) times daily with a meal. 30 capsule 3   No current facility-administered medications on file prior to visit.    No Known Allergies  Family History  Problem Relation Age of Onset  . Arthritis Mother   . Osteoporosis Mother   . Diabetes Maternal Grandmother     Social History   Social History  . Marital Status: Legally Separated    Spouse Name: N/A  . Number of Children: 2  . Years of Education: N/A   Occupational History  .  [employer]   Social History Main Topics  . Smoking status: Never Smoker   . Smokeless tobacco: Never Used  . Alcohol Use: 1.8 oz/week    3 Cans of beer per week  . Drug Use: No  . Sexual Activity: Not Asked   Other Topics Concern  . 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  All other ROS are negative.  BP 158/100 mmHg  Pulse 63  Temp(Src) 98 F (36.7 C) (Oral)  Resp 16  Ht 5\' 4"  (1.626 m)  Wt 162 lb 4 oz (73.596 kg)  BMI 27.84 kg/m2  SpO2 100%  Physical Exam  Constitutional: She is oriented to person, place, and time and well-developed, well-nourished, and in no distress.  HENT:  Head: Normocephalic and atraumatic.  Eyes: Conjunctivae are normal.  Neck: Neck supple.  Cardiovascular: Normal rate, regular rhythm, normal heart sounds and intact distal pulses.   Pulmonary/Chest: Effort normal and breath sounds normal. No respiratory distress. She has no wheezes. She has no rales. She exhibits no tenderness.  Abdominal: Soft. Bowel sounds are normal. She exhibits no distension. There is no tenderness. There is no rebound and no guarding.  + umbilical hernia  Neurological: She is alert and oriented to person, place, and time.  Skin: Skin is warm and dry. No rash noted.  Psychiatric: Affect normal.  Vitals reviewed.   No results found for this or any previous visit (from the past 2160 hour(s)).  Assessment/Plan: Umbilical hernia, incarcerated Asymptomatic. Reducible. Will refer to General Surgery for assessment per patient request.  HTN (hypertension) Above goal due to non-compliance. Discussed  important of taking BP medications as directed and risks of uncontrolled BP. DASH diet encouraged.  Family history of diabetes mellitus Will check UA and BMP. Will repeat A1C today.  Chronic idiopathic constipation Continue Amitiza. Bowel regimen discussed to add on for constipation. Follow-up if symptoms are not resolving.

## 2015-01-26 NOTE — Assessment & Plan Note (Signed)
Continue Amitiza. Bowel regimen discussed to add on for constipation. Follow-up if symptoms are not resolving.

## 2015-01-26 NOTE — Assessment & Plan Note (Signed)
Asymptomatic. Reducible. Will refer to General Surgery for assessment per patient request.

## 2015-01-26 NOTE — Progress Notes (Signed)
Pre visit review using our clinic review tool, if applicable. No additional management support is needed unless otherwise documented below in the visit note/SLS  

## 2015-01-26 NOTE — Assessment & Plan Note (Signed)
Above goal due to non-compliance. Discussed important of taking BP medications as directed and risks of uncontrolled BP. DASH diet encouraged.

## 2015-01-26 NOTE — Patient Instructions (Signed)
Please take your BP medication as directed.  Make sure to take every day without fail as your BP today reflects missed doses. Increase fluids but limit salt intake.  Please continue the Amitiza daily as directed. Follow the regimen below for stools:  I encourage you to increase hydration and the amount of fiber in your diet.  Start a daily probiotic (Align, Culturelle, Digestive Advantage, etc.). If no bowel movement within 24 hours, take 2 Tbs of Milk of Magnesia in a 4 oz glass of warmed prune juice every 2-3 days to help promote bowel movement. If no results within 24 hours, then repeat above regimen, adding a Dulcolax stool softener to regimen. If this does not promote a bowel movement, please call the office.  You will be contacted by General Surgery for assessment.  Please go to the lab. I will call you with your results.

## 2015-01-27 ENCOUNTER — Telehealth: Payer: Self-pay | Admitting: Physician Assistant

## 2015-01-27 NOTE — Telephone Encounter (Signed)
Caller name: Janett Billow  [place]   Relationship to patient:   Can be reached: [phone]  Reason for call: She says that pt was referred to her office for General Surgery but a referral isnt needed, pt need to go to the hospital.

## 2015-01-27 NOTE — Telephone Encounter (Signed)
LMOM with contact name and number for return call RE: patient's need for appointment with General surgeon for A&E for chronic, mostly asymptomatic Hernia per provider instructions/SLS

## 2015-01-27 NOTE — Telephone Encounter (Signed)
She is not having acute hernia -- has been present for some time and slightly symptoms. Is not having strangulated hernia. She just needs appointment at her request for chronic hernia.

## 2015-04-01 ENCOUNTER — Encounter: Payer: Self-pay | Admitting: Medical

## 2015-04-01 ENCOUNTER — Ambulatory Visit (INDEPENDENT_AMBULATORY_CARE_PROVIDER_SITE_OTHER): Payer: BLUE CROSS/BLUE SHIELD | Admitting: Medical

## 2015-04-01 VITALS — BP 100/70 | HR 78 | Temp 97.8°F | Ht 64.0 in | Wt 158.0 lb

## 2015-04-01 DIAGNOSIS — R6883 Chills (without fever): Secondary | ICD-10-CM | POA: Diagnosis not present

## 2015-04-01 DIAGNOSIS — J029 Acute pharyngitis, unspecified: Secondary | ICD-10-CM

## 2015-04-01 LAB — POCT INFLUENZA A/B: INFLUENZA A, POC: NEGATIVE

## 2015-04-01 LAB — POCT RAPID STREP A (OFFICE): Rapid Strep A Screen: NEGATIVE

## 2015-04-01 NOTE — Progress Notes (Signed)
Pre visit review using our clinic review tool, if applicable. No additional management support is needed unless otherwise documented below in the visit note. 

## 2015-04-01 NOTE — Progress Notes (Signed)
Subjective:    Patient ID: Debra Ayers, female    DOB: [birth year], 44 y.o.   MRN: [id number]  HPI  Pt in with sore throat. Pain for one day. Pt states her manager sick but not known exact what he had. Some bodyaches today. LMP- on mirena.  Pain swallowing anything. Even own saliva.  Pt took one amoxicllin she had at home and started to feel better.    Review of Systems  Constitutional: Positive for chills and fatigue. Negative for fever.  HENT: Positive for sore throat. Negative for congestion.   Respiratory: Negative for choking and wheezing.   Cardiovascular: Negative for chest pain and palpitations.  Musculoskeletal: Positive for arthralgias.       Most of pain is in legs but muscle aches as well.  Neurological: Positive for headaches.   Past Medical History  Diagnosis Date  . Hypertension   . Heart murmur   . Anemia     Social History   Social History  . Marital Status: Legally Separated    Spouse Name: N/A  . Number of Children: 2  . Years of Education: N/A   Occupational History  .  [place]   Social History Main Topics  . Smoking status: Never Smoker   . Smokeless tobacco: Never Used  . Alcohol Use: 1.8 oz/week    3 Cans of beer per week  . Drug Use: No  . Sexual Activity: Not on file   Other Topics Concern  . Not on file   Social History Narrative   Has 43 yr old and 43 year old   Works as Dance movement psychotherapist at [employer]   Separated   She completed 2 years college   Enjoys entertaining friends    Past Surgical History  Procedure Laterality Date  . Breast surgery  2000    Left breast biopsy, benign  . Back surgery      Family History  Problem Relation Age of Onset  . Arthritis Mother   . Osteoporosis Mother   . Diabetes Maternal Grandmother     No Known Allergies  Current Outpatient Prescriptions on File Prior to Visit  Medication Sig Dispense Refill  . enalapril-hydrochlorothiazide (VASERETIC) 10-25 MG per tablet Take 1  tablet by mouth daily. 90 tablet 1  . ibuprofen (ADVIL,MOTRIN) 200 MG tablet Take 200 mg by mouth every 6 (six) hours as needed.    . lubiprostone (AMITIZA) 24 MCG capsule Take 1 capsule (24 mcg total) by mouth 2 (two) times daily with a meal. 30 capsule 3   No current facility-administered medications on file prior to visit.    BP 100/70 mmHg  Pulse 78  Temp(Src) 97.8 F (36.6 C) (Oral)  Ht 5\' 4"  (1.626 m)  Wt 158 lb (71.668 kg)  BMI 27.11 kg/m2  SpO2 98%       Objective:   Physical Exam  General  Mental Status - Alert. General Appearance - Well groomed. Not in acute distress.  Skin Rashes- No Rashes.  HEENT Head- Normal. Ear Auditory Canal - Left- Normal. Right - Normal.Tympanic Membrane- Left- Normal. Right- Normal. Eye Sclera/Conjunctiva- Left- Normal. Right- Normal. Nose & Sinuses Nasal Mucosa- Left-  Not oggy or Congested. Right-  Not  boggy or Congested. Mouth & Throat Lips: Upper Lip- Normal: no dryness, cracking, pallor, cyanosis, or vesicular eruption. Lower Lip-Normal: no dryness, cracking, pallor, cyanosis or vesicular eruption. Buccal Mucosa- Bilateral- No Aphthous ulcers. Oropharynx- No Discharge or Erythema. Tonsils: Characteristics- Bilateral- Moderate  Erythema and Congestion. Size/Enlargement- Bilateral- No enlargement. Discharge- bilateral-None.  Neck Neck- Supple. No Masses. Full rom.    Chest and Lung Exam Auscultation: Breath Sounds:- even and unlabored  Cardiovascular Auscultation:Rythm- Regular, rate and rhythm. Murmurs & Other Heart Sounds:Ausculatation of the heart reveal- No Murmurs.  Lymphatic Head & Neck General Head & Neck Lymphatics: Bilateral: Description- mild left submandbular node enlargement and tender.      Assessment & Plan:  You have symptoms that are  suspicious for strep so  will rx azithromycin antibiotic.  For fever and bodyaches can take ibuprofen otc.  Your rapid strep and flu test were negative. Important to  note test can be falsely negative. In your case suspect false negative strep.  Follow up in 7 days or as needed

## 2015-04-01 NOTE — Patient Instructions (Addendum)
You have symptoms that are suspicious for strep so  will rx azithromycin antibiotic.  For fever and bodyaches can take ibuprofen otc.  Your rapid strep and flu test were negative. Important to  Note  test can be falsely negative. In your case suspect false negative strep.  Follow up in 7 days or as needed

## 2015-04-06 ENCOUNTER — Telehealth: Payer: Self-pay | Admitting: Physician Assistant

## 2015-05-19 NOTE — Telephone Encounter (Signed)
flu

## 2015-05-30 ENCOUNTER — Encounter (HOSPITAL_COMMUNITY): Payer: Self-pay | Admitting: Nurse Practitioner

## 2015-05-30 ENCOUNTER — Emergency Department (HOSPITAL_COMMUNITY)
Admission: EM | Admit: 2015-05-30 | Discharge: 2015-05-30 | Disposition: A | Discharge status: Home / Self Care | Payer: BLUE CROSS/BLUE SHIELD | Attending: Emergency Medicine | Admitting: Emergency Medicine

## 2015-05-30 DIAGNOSIS — M546 Pain in thoracic spine: Secondary | ICD-10-CM | POA: Diagnosis present

## 2015-05-30 DIAGNOSIS — Z86018 Personal history of other benign neoplasm: Secondary | ICD-10-CM | POA: Insufficient documentation

## 2015-05-30 DIAGNOSIS — Z79899 Other long term (current) drug therapy: Secondary | ICD-10-CM | POA: Insufficient documentation

## 2015-05-30 DIAGNOSIS — M62830 Muscle spasm of back: Secondary | ICD-10-CM | POA: Diagnosis not present

## 2015-05-30 DIAGNOSIS — R011 Cardiac murmur, unspecified: Secondary | ICD-10-CM | POA: Diagnosis not present

## 2015-05-30 DIAGNOSIS — I10 Essential (primary) hypertension: Secondary | ICD-10-CM | POA: Insufficient documentation

## 2015-05-30 DIAGNOSIS — Z862 Personal history of diseases of the blood and blood-forming organs and certain disorders involving the immune mechanism: Secondary | ICD-10-CM | POA: Insufficient documentation

## 2015-05-30 HISTORY — DX: Leiomyoma of uterus, unspecified: D25.9

## 2015-05-30 MED ORDER — CYCLOBENZAPRINE HCL 10 MG PO TABS: 10.0000 mg | ORAL_TABLET | Freq: Two times a day (BID) | ORAL | Status: DC | PRN

## 2015-05-30 MED ORDER — NAPROXEN 375 MG PO TABS: 375.0000 mg | ORAL_TABLET | Freq: Two times a day (BID) | ORAL | Status: DC

## 2015-05-30 NOTE — ED Notes (Signed)
See PAs assessment for secondary assessment

## 2015-05-30 NOTE — ED Notes (Signed)
PA at bedside.

## 2015-05-30 NOTE — Discharge Instructions (Signed)
You appear to have a spasm of the right Serratus anterior muscle. Please let your therapist know this.  [facility] is located within [place] [place], [place] [address] Phone:[phone] Email: [email or web address]  [facility] on [address]   Back Exercises The following exercises strengthen the muscles that help to support the back. They also help to keep the lower back flexible. Doing these exercises can help to prevent back pain or lessen existing pain. If you have back pain or discomfort, try doing these exercises 2-3 times each day or as told by your health care provider. When the pain goes away, do them once each day, but increase the number of times that you repeat the steps for each exercise (do more repetitions). If you do not have back pain or discomfort, do these exercises once each day or as told by your health care provider. EXERCISES Single Knee to Chest Repeat these steps 3-5 times for each leg:  Lie on your back on a firm bed or the floor with your legs extended.  Bring one knee to your chest. Your other leg should stay extended and in contact with the floor.  Hold your knee in place by grabbing your knee or thigh.  Pull on your knee until you feel a gentle stretch in your lower back.  Hold the stretch for 10-30 seconds.  Slowly release and straighten your leg. Pelvic Tilt Repeat these steps 5-10 times:  Lie on your back on a firm bed or the floor with your legs extended.  Bend your knees so they are pointing toward the ceiling and your feet are flat on the floor.  Tighten your lower abdominal muscles to press your lower back against the floor. This motion will tilt your pelvis so your tailbone points up toward the ceiling instead of pointing to your feet or the floor.  With gentle tension and even breathing, hold this position for 5-10 seconds. Cat-Cow Repeat these steps until  your lower back becomes more flexible:  Get into a hands-and-knees position on a firm surface. Keep your hands under your shoulders, and keep your knees under your hips. You may place padding under your knees for comfort.  Let your head hang down, and point your tailbone toward the floor so your lower back becomes rounded like the back of a cat.  Hold this position for 5 seconds.  Slowly lift your head and point your tailbone up toward the ceiling so your back forms a sagging arch like the back of a cow.  Hold this position for 5 seconds. Press-Ups Repeat these steps 5-10 times:  Lie on your abdomen (face-down) on the floor.  Place your palms near your head, about shoulder-width apart.  While you keep your back as relaxed as possible and keep your hips on the floor, slowly straighten your arms to raise the top half of your body and lift your shoulders. Do not use your back muscles to raise your upper torso. You may adjust the placement of your hands to make yourself more comfortable.  Hold this position for 5 seconds while you keep your back relaxed.  Slowly return to lying flat on the floor. Bridges Repeat these steps 10 times:  Lie on your back on a firm surface.  Bend your knees so they are pointing toward the ceiling and your feet are flat on the floor.  Tighten your buttocks muscles and lift your buttocks off of the floor until  your waist is at almost the same height as your knees. You should feel the muscles working in your buttocks and the back of your thighs. If you do not feel these muscles, slide your feet 1-2 inches farther away from your buttocks.  Hold this position for 3-5 seconds.  Slowly lower your hips to the starting position, and allow your buttocks muscles to relax completely. If this exercise is too easy, try doing it with your arms crossed over your chest. Abdominal Crunches Repeat these steps 5-10 times:  Lie on your back on a firm bed or the floor with  your legs extended.  Bend your knees so they are pointing toward the ceiling and your feet are flat on the floor.  Cross your arms over your chest.  Tip your chin slightly toward your chest without bending your neck.  Tighten your abdominal muscles and slowly raise your trunk (torso) high enough to lift your shoulder blades a tiny bit off of the floor. Avoid raising your torso higher than that, because it can put too much stress on your low back and it does not help to strengthen your abdominal muscles.  Slowly return to your starting position. Back Lifts Repeat these steps 5-10 times: 1. Lie on your abdomen (face-down) with your arms at your sides, and rest your forehead on the floor. 2. Tighten the muscles in your legs and your buttocks. 3. Slowly lift your chest off of the floor while you keep your hips pressed to the floor. Keep the back of your head in line with the curve in your back. Your eyes should be looking at the floor. 4. Hold this position for 3-5 seconds. 5. Slowly return to your starting position. SEEK MEDICAL CARE IF:  Your back pain or discomfort gets much worse when you do an exercise.  Your back pain or discomfort does not lessen within 2 hours after you exercise. If you have any of these problems, stop doing these exercises right away. Do not do them again unless your health care provider says that you can. SEEK IMMEDIATE MEDICAL CARE IF:  You develop sudden, severe back pain. If this happens, stop doing the exercises right away. Do not do them again unless your health care provider says that you can.   This information is not intended to replace advice given to you by your health care provider. Make sure you discuss any questions you have with your health care provider.   Document Released: 05/18/2004 Document Revised: 12/30/2014 Document Reviewed: 06/04/2014 Elsevier Interactive Patient Education 2016 [place] Injury Prevention Back injuries can be  very painful. They can also be difficult to heal. After having one back injury, you are more likely to injure your back again. It is important to learn how to avoid injuring or re-injuring your back. The following tips can help you to prevent a back injury. WHAT SHOULD I KNOW ABOUT PHYSICAL FITNESS?  Exercise for 30 minutes per day on most days of the week or as directed by your health care provider. Make sure to:  Do aerobic exercises, such as walking, jogging, biking, or swimming.  Do exercises that increase balance and strength, such as tai chi and yoga. These can decrease your risk of falling and injuring your back.  Do stretching exercises to help with flexibility.  Try to develop strong abdominal muscles. Your abdominal muscles provide a lot of the support that is needed by your back.  Maintain a healthy weight. This helps to  decrease your risk of a back injury. WHAT SHOULD I KNOW ABOUT MY DIET?  Talk with your health care provider about your overall diet. Take supplements and vitamins only as directed by your health care provider.  Talk with your health care provider about how much calcium and vitamin D you need each day. These nutrients help to prevent weakening of the bones (osteoporosis). Osteoporosis can cause broken (fractured) bones, which lead to back pain.  Include good sources of calcium in your diet, such as dairy products, green leafy vegetables, and products that have had calcium added to them (fortified).  Include good sources of vitamin D in your diet, such as milk and foods that are fortified with vitamin D. WHAT SHOULD I KNOW ABOUT MY POSTURE?  Sit up straight and stand up straight. Avoid leaning forward when you sit or hunching over when you stand.  Choose chairs that have good low-back (lumbar) support.  If you work at a desk, sit close to it so you do not need to lean over. Keep your chin tucked in. Keep your neck drawn back, and keep your elbows bent at a  right angle. Your arms should look like the letter "L."  Sit high and close to the steering wheel when you drive. Add a lumbar support to your car seat, if needed.  Avoid sitting or standing in one position for very long. Take breaks to get up, stretch, and walk around at least one time every hour. Take breaks every hour if you are driving for long periods of time.  Sleep on your side with your knees slightly bent, or sleep on your back with a pillow under your knees. Do not lie on the front of your body to sleep. WHAT SHOULD I KNOW ABOUT LIFTING, TWISTING, AND REACHING? Lifting and Heavy Lifting  Avoid heavy lifting, especially repetitive heavy lifting. If you must do heavy lifting:  Stretch before lifting.  Work slowly.  Rest between lifts.  Use a tool such as a cart or a dolly to move objects if one is available.  Make several small trips instead of carrying one heavy load.  Ask for help when you need it, especially when moving big objects.  Follow these steps when lifting:  Stand with your feet shoulder-width apart.  Get as close to the object as you can. Do not try to pick up a heavy object that is far from your body.  Use handles or lifting straps if they are available.  Bend at your knees. Squat down, but keep your heels off the floor.  Keep your shoulders pulled back, your chin tucked in, and your back straight.  Lift the object slowly while you tighten the muscles in your legs, abdomen, and buttocks. Keep the object as close to the center of your body as possible.  Follow these steps when putting down a heavy load:  Stand with your feet shoulder-width apart.  Lower the object slowly while you tighten the muscles in your legs, abdomen, and buttocks. Keep the object as close to the center of your body as possible.  Keep your shoulders pulled back, your chin tucked in, and your back straight.  Bend at your knees. Squat down, but keep your heels off the  floor.  Use handles or lifting straps if they are available. Twisting and Reaching  Avoid lifting heavy objects above your waist.  Do not twist at your waist while you are lifting or carrying a load. If you need to turn,  move your feet.  Do not bend over without bending at your knees.  Avoid reaching over your head, across a table, or for an object on a high surface. WHAT ARE SOME OTHER TIPS?  Avoid wet floors and icy ground. Keep sidewalks clear of ice to prevent falls.  Do not sleep on a mattress that is too soft or too hard.  Keep items that are used frequently within easy reach.  Put heavier objects on shelves at waist level, and put lighter objects on lower or higher shelves.  Find ways to decrease your stress, such as exercise, massage, or relaxation techniques. Stress can build up in your muscles. Tense muscles are more vulnerable to injury.  Talk with your health care provider if you feel anxious or depressed. These conditions can make back pain worse.  Wear flat heel shoes with cushioned soles.  Avoid sudden movements.  Use both shoulder straps when carrying a backpack.  Do not use any tobacco products, including cigarettes, chewing tobacco, or electronic cigarettes. If you need help quitting, ask your health care provider.   This information is not intended to replace advice given to you by your health care provider. Make sure you discuss any questions you have with your health care provider.   Document Released: 05/18/2004 Document Revised: 08/25/2014 Document Reviewed: 04/14/2014 Elsevier Interactive Patient Education [employer].

## 2015-05-30 NOTE — ED Notes (Signed)
She c/o intermittent R mid back pain since waking from bed a few days ago. She denies any exertion or strenuous activity prior to onset. Pain aggravated with movement, inspiration. Pain relieved with rest. Pt denies cough, chills, fevers, nausea, bowel/bladder changes. She reports hx muscle spasms in her back that seems similar to this. She is ambulatory, mae

## 2015-05-30 NOTE — ED Provider Notes (Signed)
CSN: [id number]     Arrival date & time 05/30/15  1038 History   First MD Initiated Contact with Patient 05/30/15 1311     Chief Complaint  Patient presents with  . Back Pain     (Consider location/radiation/quality/duration/timing/severity/associated sxs/prior Treatment) HPI   Patient name is a 45 year old female who presents emergency Department with chief complaint of right back pain. Patient states that she is having some achiness in her mid back that began 4 days ago. She states that it is worse when she is pushing up out of bed or laying back down and stabilizing with her arms. She has pain with twisting, and deep breathing. She states that she is having difficulty localizing the pain, specifically. She denies symptoms of pulmonary embolus,  low-grade fever, tachycardia, hemoptysis or shortness of breath. She states that she does not smoke, use exogenous estrogens. She denies unilateral leg swelling or history of pulmonary embolus. She denies any recent injuries, coughing. She denies fevers, chills or signs of systemic sickness. Pain is there when still, worse with certain movements. His nares do anything to treat the pain. She states she got a deep tissue massage 2 days ago, but it did not improve her pain.  Past Medical History  Diagnosis Date  . Hypertension   . Heart murmur   . Anemia   . Uterine fibroid    Past Surgical History  Procedure Laterality Date  . Breast surgery  2000    Left breast biopsy, benign  . Back surgery    . Tubal ligation     Family History  Problem Relation Age of Onset  . Arthritis Mother   . Osteoporosis Mother   . Diabetes Maternal Grandmother    Social History  Substance Use Topics  . Smoking status: Never Smoker   . Smokeless tobacco: Never Used  . Alcohol Use: 1.8 oz/week    3 Cans of beer per week   OB History    No data available     Review of Systems  Ten systems reviewed and are negative for acute change, except as noted in the  HPI.    Allergies  Review of patient's allergies indicates no known allergies.  Home Medications   Prior to Admission medications   Medication Sig Start Date End Date Taking? Authorizing Provider  enalapril-hydrochlorothiazide (VASERETIC) 10-25 MG per tablet Take 1 tablet by mouth daily. 08/12/14  Yes Brunetta Jeans, PA-C  lubiprostone (AMITIZA) 24 MCG capsule Take 1 capsule (24 mcg total) by mouth 2 (two) times daily with a meal. 10/05/14  Yes Brunetta Jeans, PA-C  ibuprofen (ADVIL,MOTRIN) 200 MG tablet Take 200 mg by mouth every 6 (six) hours as needed for mild pain. Reported on 05/30/2015    Historical Provider, MD   BP 156/88 mmHg  Pulse 78  Temp(Src) 98.4 F (36.9 C) (Oral)  Resp 20  SpO2 98% Physical Exam  Constitutional: She is oriented to person, place, and time. She appears well-developed and well-nourished. No distress.  HENT:  Head: Normocephalic and atraumatic.  Eyes: Conjunctivae are normal. No scleral icterus.  Neck: Normal range of motion.  Cardiovascular: Normal rate, regular rhythm and normal heart sounds.  Exam reveals no gallop and no friction rub.   No murmur heard. Pulmonary/Chest: Effort normal and breath sounds normal. No respiratory distress.  Abdominal: Soft. Bowel sounds are normal. She exhibits no distension and no mass. There is no tenderness. There is no guarding.  Musculoskeletal:  Arms: Patient with tenderness to palpation of the right rib cage. She complains of pain in the mid back region, which is not reproducible with palpation. Palpation of the muscular tissue of the serratus anterior refers direct pain to the area of complaint in the mid back region.    Neurological: She is alert and oriented to person, place, and time.  Skin: Skin is warm and dry. She is not diaphoretic.    ED Course  Procedures (including critical care time) Labs Review Labs Reviewed - No data to display  Imaging Review No results found. I have personally  reviewed and evaluated these images and lab results as part of my medical decision-making.   EKG Interpretation None      MDM   Final diagnoses:  Back muscle spasm   Patient with pain of the serratus anterior. The pain follows the referral pattern, specifically as noted below in the pictures. I was able to decrease the spasm, by stretching the scapula on file from the rib cage and applying direct pressure to the serratus both along the lateral chest wall and behind the right scapula. Patient had decreased pain and increased range of motion. I am putting the patient to physical therapy and advanced practice massage therapist. Patient is perc negative. She appears safe for discharge at this time        Margarita Mail, PA-C 05/30/15 [place], MD 05/31/15 2306

## 2015-09-10 ENCOUNTER — Ambulatory Visit (INDEPENDENT_AMBULATORY_CARE_PROVIDER_SITE_OTHER): Payer: BLUE CROSS/BLUE SHIELD | Admitting: Physician Assistant

## 2015-09-10 ENCOUNTER — Encounter: Payer: Self-pay | Admitting: Physician Assistant

## 2015-09-10 VITALS — BP 140/100 | HR 69 | Temp 98.5°F | Ht 64.0 in | Wt 155.8 lb

## 2015-09-10 DIAGNOSIS — Z0000 Encounter for general adult medical examination without abnormal findings: Secondary | ICD-10-CM | POA: Insufficient documentation

## 2015-09-10 DIAGNOSIS — I10 Essential (primary) hypertension: Secondary | ICD-10-CM

## 2015-09-10 DIAGNOSIS — B353 Tinea pedis: Secondary | ICD-10-CM

## 2015-09-10 DIAGNOSIS — Z0001 Encounter for general adult medical examination with abnormal findings: Secondary | ICD-10-CM | POA: Diagnosis not present

## 2015-09-10 LAB — COMPREHENSIVE METABOLIC PANEL
ALBUMIN: 4.7 g/dL (ref 3.5–5.2)
ALK PHOS: 55 U/L (ref 39–117)
ALT: 20 U/L (ref 0–35)
AST: 18 U/L (ref 0–37)
BILIRUBIN TOTAL: 0.8 mg/dL (ref 0.2–1.2)
BUN: 10 mg/dL (ref 6–23)
CO2: 27 mEq/L (ref 19–32)
CREATININE: 0.92 mg/dL (ref 0.40–1.20)
Calcium: 9.6 mg/dL (ref 8.4–10.5)
Chloride: 105 mEq/L (ref 96–112)
GFR: 70.49 mL/min (ref >60.00)
GLUCOSE: 93 mg/dL (ref 70–99)
POTASSIUM: 4 meq/L (ref 3.5–5.1)
SODIUM: 137 meq/L (ref 135–145)
TOTAL PROTEIN: 7.1 g/dL (ref 6.0–8.3)

## 2015-09-10 LAB — HEMOGLOBIN A1C: Hgb A1c MFr Bld: 5.6 % (ref 4.6–6.5)

## 2015-09-10 LAB — URINALYSIS, ROUTINE W REFLEX MICROSCOPIC
Bilirubin Urine: NEGATIVE
Hgb urine dipstick: NEGATIVE
KETONES UR: NEGATIVE
Leukocytes, UA: NEGATIVE
Nitrite: NEGATIVE
PH: 5.5 (ref 5.0–8.0)
RBC / HPF: NONE SEEN (ref 0–?)
TOTAL PROTEIN, URINE-UPE24: NEGATIVE
UROBILINOGEN UA: 0.2 (ref 0.0–1.0)
Urine Glucose: NEGATIVE

## 2015-09-10 LAB — CBC
HCT: 42.5 % (ref 36.0–46.0)
Hemoglobin: 14.2 g/dL (ref 12.0–15.0)
MCHC: 33.5 g/dL (ref 30.0–36.0)
MCV: 86.4 fl (ref 78.0–100.0)
Platelets: 249 10*3/uL (ref 150.0–400.0)
RBC: 4.91 Mil/uL (ref 3.87–5.11)
RDW: 13.7 % (ref 11.5–15.5)
WBC: 9 10*3/uL (ref 4.0–10.5)

## 2015-09-10 LAB — LIPID PANEL
CHOL/HDL RATIO: 4
Cholesterol: 198 mg/dL (ref 0–200)
HDL: 47.7 mg/dL (ref >39.00)
LDL Cholesterol: 138 mg/dL — ABNORMAL HIGH (ref 0–99)
NONHDL: 150
Triglycerides: 62 mg/dL (ref 0.0–149.0)
VLDL: 12.4 mg/dL (ref 0.0–40.0)

## 2015-09-10 LAB — VITAMIN D 25 HYDROXY (VIT D DEFICIENCY, FRACTURES): VITD: 18.75 ng/mL — ABNORMAL LOW (ref 30.00–100.00)

## 2015-09-10 LAB — TSH: TSH: 1.41 u[IU]/mL (ref 0.35–4.50)

## 2015-09-10 MED ORDER — CLOTRIMAZOLE-BETAMETHASONE 1-0.05 % EX CREA: 1.0000 "application " | TOPICAL_CREAM | Freq: Two times a day (BID) | CUTANEOUS | Status: DC

## 2015-09-10 NOTE — Assessment & Plan Note (Signed)
Non-compliant. Asymptomatic. Reviewed risk of stroke and MI with uncontrolled BP. Patient agrees to restart medications. FU 1 month for BP recheck.

## 2015-09-10 NOTE — Assessment & Plan Note (Signed)
Rx lotrisone. Supportive measures reviewed.

## 2015-09-10 NOTE — Patient Instructions (Signed)
Please go to the lab for blood work.   Our office will call you with your results unless you have chosen to receive results via MyChart.  If your blood work is normal we will follow-up each year for physicals and as scheduled for chronic medical problems.  If anything is abnormal we will treat accordingly and get you in for a follow-up.  Please take your blood pressure medication every day. Return in 1 month having taken medication so we can assess how well medication is working.  Apply the cream given as directed to help with yeast inbetween toes. Keep feet dry. Powder applied in the shoes may help absorb moisture.Marland Kitchen

## 2015-09-10 NOTE — Progress Notes (Signed)
Pre visit review using our clinic review tool, if applicable. No additional management support is needed unless otherwise documented below in the visit note. 

## 2015-09-10 NOTE — Assessment & Plan Note (Signed)
Depression screen negative. Health Maintenance reviewed -- Declines Tetanus. PAP up-to-date. Defers mammogram to GYN. Preventive schedule discussed and handout given in AVS. Will obtain fasting labs today.

## 2015-09-10 NOTE — Progress Notes (Signed)
Patient presents to clinic today for annual exam.  Patient is fasting for labs.  Acute Concerns: Patient endorses fatigue and anhedonia over the past few months. Endorses stressors at work and at home. Endorses sleeping too much. Endorses depressed mood overall. Denies anxiety. Denies SI/HI. Does not want to be on a medication.  Patient endorses itching and redness between her toes of L foot over the past month. Has been soaking in peroxide without improvement. Is trying to keep feet dry but hard with working.  Chronic Issues: Hypertension -- Is only taking her medication some of the time. States she forgets to take most days. Has not taken today. Patient denies chest pain, palpitations, lightheadedness, dizziness, vision changes or frequent headaches.  BP Readings from Last 3 Encounters:  09/10/15 140/100  05/30/15 159/103  04/01/15 100/70   Health Maintenance: Immunizations -- Does not remember last Tetanus. Declines today. Mammogram -- Followed by GYN. Defers mammogram to specialist. PAP -- up-to-date. Followed by GYN.  Past Medical History  Diagnosis Date  . Hypertension   . Heart murmur   . Anemia   . Uterine fibroid     Past Surgical History  Procedure Laterality Date  . Breast surgery  2000    Left breast biopsy, benign  . Back surgery    . Tubal ligation      Current Outpatient Prescriptions on File Prior to Visit  Medication Sig Dispense Refill  . enalapril-hydrochlorothiazide (VASERETIC) 10-25 MG per tablet Take 1 tablet by mouth daily. 90 tablet 1  . lubiprostone (AMITIZA) 24 MCG capsule Take 1 capsule (24 mcg total) by mouth 2 (two) times daily with a meal. 30 capsule 3   No current facility-administered medications on file prior to visit.    No Known Allergies  Family History  Problem Relation Age of Onset  . Arthritis Mother   . Osteoporosis Mother   . Diabetes Mother   . Diabetes Maternal Grandmother   . Diabetes Son   . Diabetes Maternal Aunt    . Diabetes Maternal Uncle     Social History   Social History  . Marital Status: Legally Separated    Spouse Name: N/A  . Number of Children: 2  . Years of Education: N/A   Occupational History  .  [employer]   Social History Main Topics  . Smoking status: Never Smoker   . Smokeless tobacco: Never Used  . Alcohol Use: 1.8 oz/week    3 Cans of beer per week  . Drug Use: No  . Sexual Activity: Yes    Birth Control/ Protection: IUD   Other Topics Concern  . Not on file   Social History Narrative   Has 44 yr old and 44 year old   Works as Dance movement psychotherapist at [employer]   Separated   She completed 2 years college   Enjoys entertaining friends   Review of Systems  Constitutional: Negative for fever and weight loss.  HENT: Negative for ear discharge, ear pain, hearing loss and tinnitus.   Eyes: Negative for blurred vision, double vision, photophobia and pain.  Respiratory: Negative for cough and shortness of breath.   Cardiovascular: Negative for chest pain and palpitations.  Gastrointestinal: Negative for heartburn, nausea, vomiting, abdominal pain, diarrhea, constipation, blood in stool and melena.  Genitourinary: Negative for dysuria, urgency, frequency, hematuria and flank pain.  Musculoskeletal: Negative for falls.  Skin: Positive for itching.  Neurological: Negative for dizziness, loss of consciousness and headaches.  Endo/Heme/Allergies:  Negative for environmental allergies.  Psychiatric/Behavioral: Negative for depression, suicidal ideas, hallucinations and substance abuse. The patient is not nervous/anxious and does not have insomnia.    BP 140/100 mmHg  Pulse 69  Temp(Src) 98.5 F (36.9 C) (Oral)  Ht 5\' 4"  (1.626 m)  Wt 155 lb 12.8 oz (70.67 kg)  BMI 26.73 kg/m2  SpO2 99%  Physical Exam  Constitutional: She is oriented to person, place, and time and well-developed, well-nourished, and in no distress.  HENT:  Head: Normocephalic and atraumatic.  Right  Ear: Tympanic membrane, external ear and ear canal normal.  Left Ear: Tympanic membrane, external ear and ear canal normal.  Nose: Nose normal. No mucosal edema.  Mouth/Throat: Uvula is midline, oropharynx is clear and moist and mucous membranes are normal. No oropharyngeal exudate or posterior oropharyngeal erythema.  Eyes: Conjunctivae are normal. Pupils are equal, round, and reactive to light.  Neck: Neck supple. No thyromegaly present.  Cardiovascular: Normal rate, regular rhythm, normal heart sounds and intact distal pulses.   Pulmonary/Chest: Effort normal and breath sounds normal. No respiratory distress. She has no wheezes. She has no rales.  Abdominal: Soft. Bowel sounds are normal. She exhibits no distension and no mass. There is no tenderness. There is no rebound and no guarding.  Lymphadenopathy:    She has no cervical adenopathy.  Neurological: She is alert and oriented to person, place, and time. No cranial nerve deficit.  Skin: Skin is warm and dry. No rash noted.     Psychiatric: Affect normal.  Vitals reviewed.   No results found for this or any previous visit (from the past 2160 hour(s)).  Assessment/Plan: Visit for preventive health examination Depression screen negative. Health Maintenance reviewed -- Declines Tetanus. PAP up-to-date. Defers mammogram to GYN. Preventive schedule discussed and handout given in AVS. Will obtain fasting labs today.   Tinea pedis of left foot Rx lotrisone. Supportive measures reviewed.  HTN (hypertension) Non-compliant. Asymptomatic. Reviewed risk of stroke and MI with uncontrolled BP. Patient agrees to restart medications. FU 1 month for BP recheck.

## 2015-09-24 ENCOUNTER — Encounter: Payer: Self-pay | Admitting: *Deleted

## 2015-09-24 ENCOUNTER — Telehealth: Payer: Self-pay | Admitting: *Deleted

## 2015-09-24 DIAGNOSIS — R7989 Other specified abnormal findings of blood chemistry: Secondary | ICD-10-CM

## 2015-09-24 MED ORDER — VITAMIN D (ERGOCALCIFEROL) 1.25 MG (50000 UNIT) PO CAPS: ORAL_CAPSULE | ORAL | Status: DC

## 2015-09-24 NOTE — Telephone Encounter (Signed)
-----   Message from Brunetta Jeans, PA-C sent at 09/14/2015  9:18 AM EDT ----- Lab results are in: -- Overall everything looks good. Her total cholesterol is good but LDL is slightly high at 138. Increase exercise to 150 minutes per week. Eat a diet low in cholesterol and saturated fats. Her Vitamin D level was low at 18.75. Want to start Rx Ergocalciferol 50,000 units once weekly for 12 weeks. Also have her start a daily Vitamin D 1000 unit supplement. FU in 3 months to reassess these levels. This will likely help with her energy levels.

## 2015-09-24 NOTE — Telephone Encounter (Signed)
Tired on several attempts to reach the pt by phone, but unable to successfully do so.  Mailed results below to the pt.  New prescription sent to the pharmacy by e-script.   Future lab ordered and sent.//AB/CMA

## 2015-10-01 ENCOUNTER — Other Ambulatory Visit: Payer: Self-pay | Admitting: Physician Assistant

## 2015-10-01 NOTE — Telephone Encounter (Signed)
Refill sent to pharmacy.   

## 2015-11-19 ENCOUNTER — Encounter: Payer: Self-pay | Admitting: Physician Assistant

## 2015-11-19 ENCOUNTER — Ambulatory Visit (INDEPENDENT_AMBULATORY_CARE_PROVIDER_SITE_OTHER): Payer: BLUE CROSS/BLUE SHIELD | Admitting: Physician Assistant

## 2015-11-19 VITALS — BP 158/116 | HR 70 | Temp 98.3°F | Resp 16 | Ht 64.0 in | Wt 154.2 lb

## 2015-11-19 DIAGNOSIS — I10 Essential (primary) hypertension: Secondary | ICD-10-CM

## 2015-11-19 DIAGNOSIS — M7711 Lateral epicondylitis, right elbow: Secondary | ICD-10-CM | POA: Diagnosis not present

## 2015-11-19 MED ORDER — CYCLOBENZAPRINE HCL 5 MG PO TABS: 5.0000 mg | ORAL_TABLET | Freq: Three times a day (TID) | ORAL | 1 refills | Status: DC | PRN

## 2015-11-19 NOTE — Patient Instructions (Signed)
Please wear the brace as directed. Take the Flexeril in the evening as directed. Tylenol for pain.  Take your BP medication EVERY DAY WITHOUT FAIL. This is the only way we can keep BP at a safe range. Limit salt intake -- see diet below.  Follow-up with me in 1 week. If you develop any chest pain, SOB, lightheadedness, call 911  '[place] DASH stands for "Dietary Approaches to Stop Hypertension." The DASH eating plan is a healthy eating plan that has been shown to reduce high blood pressure (hypertension). Additional health benefits may include reducing the risk of type 2 diabetes mellitus, heart disease, and stroke. The DASH eating plan may also help with weight loss. WHAT DO I NEED TO KNOW ABOUT THE DASH EATING PLAN? For the DASH eating plan, you will follow these general guidelines:  Choose foods with a percent daily value for sodium of less than 5% (as listed on the food label).  Use salt-free seasonings or herbs instead of table salt or sea salt.  Check with your health care provider or pharmacist before using salt substitutes.  Eat lower-sodium products, often labeled as "lower sodium" or "no salt added."  Eat fresh foods.  Eat more vegetables, fruits, and low-fat dairy products.  Choose whole grains. Look for the word "whole" as the first word in the ingredient list.  Choose fish and skinless chicken or [place] more often than red meat. Limit fish, poultry, and meat to 6 oz (170 g) each day.  Limit sweets, desserts, sugars, and sugary drinks.  Choose heart-healthy fats.  Limit cheese to 1 oz (28 g) per day.  Eat more home-cooked food and less restaurant, buffet, and fast food.  Limit fried foods.  Cook foods using methods other than frying.  Limit canned vegetables. If you do use them, rinse them well to decrease the sodium.  When eating at a restaurant, ask that your food be prepared with less salt, or no salt if possible. WHAT FOODS CAN I EAT? Seek help  from a dietitian for individual calorie needs. Grains Whole grain or whole wheat bread. Brown rice. Whole grain or whole wheat pasta. Quinoa, bulgur, and whole grain cereals. Low-sodium cereals. Corn or whole wheat flour tortillas. Whole grain cornbread. Whole grain crackers. Low-sodium crackers. Vegetables Fresh or frozen vegetables (raw, steamed, roasted, or grilled). Low-sodium or reduced-sodium tomato and vegetable juices. Low-sodium or reduced-sodium tomato sauce and paste. Low-sodium or reduced-sodium canned vegetables.  Fruits All fresh, canned (in natural juice), or frozen fruits. Meat and Other Protein Products Ground beef (85% or leaner), grass-fed beef, or beef trimmed of fat. Skinless chicken or [place]. Ground chicken or [place]. Pork trimmed of fat. All fish and seafood. Eggs. Dried beans, peas, or lentils. Unsalted nuts and seeds. Unsalted canned beans. Dairy Low-fat dairy products, such as skim or 1% milk, 2% or reduced-fat cheeses, low-fat ricotta or cottage cheese, or plain low-fat yogurt. Low-sodium or reduced-sodium cheeses. Fats and Oils Tub margarines without trans fats. Light or reduced-fat mayonnaise and salad dressings (reduced sodium). Avocado. Safflower, olive, or canola oils. Natural peanut or almond butter. Other Unsalted popcorn and pretzels. The items listed above may not be a complete list of recommended foods or beverages. Contact your dietitian for more options. WHAT FOODS ARE NOT RECOMMENDED? Grains White bread. White pasta. White rice. Refined cornbread. Bagels and croissants. Crackers that contain trans fat. Vegetables Creamed or fried vegetables. Vegetables in a cheese sauce. Regular canned vegetables. Regular canned tomato sauce and paste. Regular  tomato and vegetable juices. Fruits Dried fruits. Canned fruit in light or heavy syrup. Fruit juice. Meat and Other Protein Products Fatty cuts of meat. Ribs, chicken wings, bacon, sausage, bologna, salami,  chitterlings, fatback, hot dogs, bratwurst, and packaged luncheon meats. Salted nuts and seeds. Canned beans with salt. Dairy Whole or 2% milk, cream, half-and-half, and cream cheese. Whole-fat or sweetened yogurt. Full-fat cheeses or blue cheese. Nondairy creamers and whipped toppings. Processed cheese, cheese spreads, or cheese curds. Condiments Onion and garlic salt, seasoned salt, table salt, and sea salt. Canned and packaged gravies. Worcestershire sauce. Tartar sauce. Barbecue sauce. Teriyaki sauce. Soy sauce, including reduced sodium. Steak sauce. Fish sauce. Oyster sauce. Cocktail sauce. Horseradish. Ketchup and mustard. Meat flavorings and tenderizers. Bouillon cubes. Hot sauce. Tabasco sauce. Marinades. Taco seasonings. Relishes. Fats and Oils Butter, stick margarine, lard, shortening, ghee, and bacon fat. Coconut, palm kernel, or palm oils. Regular salad dressings. Other Pickles and olives. Salted popcorn and pretzels. The items listed above may not be a complete list of foods and beverages to avoid. Contact your dietitian for more information. WHERE CAN I FIND MORE INFORMATION? National Heart, Lung, and Blood Institute: travelstabloid.com   This information is not intended to replace advice given to you by your health care provider. Make sure you discuss any questions you have with your health care provider.   Document Released: 03/30/2011 Document Revised: 05/01/2014 Document Reviewed: 02/12/2013 Elsevier Interactive Patient Education [employer].

## 2015-11-19 NOTE — Progress Notes (Signed)
Pre visit review using our clinic review tool, if applicable. No additional management support is needed unless otherwise documented below in the visit note/SLS  

## 2015-11-20 NOTE — Progress Notes (Signed)
Patient presents to clinic today c/o 3 weeks of pain in R elbow, radiating to forearm, worse with extension of forearm and heavy lifting. Denies trauma or injury. Denies weakness, numbness, tingling, swelling or bruising. Denies true decreased ROM, just pain with ROM. Has taken tylenol with little relief in symptoms.  Patient also with history of hypertension. BP noted to be significantly elevated. Patient has not taken her BP medications in several days. Has history of noncompliance with BP medications. Patient denies chest pain, palpitations, lightheadedness, dizziness, vision changes or frequent headaches.  BP Readings from Last 3 Encounters:  11/19/15 (!) 158/116  09/10/15 (!) 140/100  05/30/15 (!) 159/103      Past Medical History:  Diagnosis Date  . Anemia   . Heart murmur   . Hypertension   . Uterine fibroid     Current Outpatient Prescriptions on File Prior to Visit  Medication Sig Dispense Refill  . clotrimazole-betamethasone (LOTRISONE) cream Apply 1 application topically 2 (two) times daily. 30 g 0  . enalapril-hydrochlorothiazide (VASERETIC) 10-25 MG tablet TAKE 1 TABLET BY MOUTH EVERY DAY 90 tablet 1  . lubiprostone (AMITIZA) 24 MCG capsule Take 1 capsule (24 mcg total) by mouth 2 (two) times daily with a meal. 30 capsule 3  . Vitamin D, Ergocalciferol, (DRISDOL) 50000 units CAPS capsule TAKE 1 CAPSULE (50,000 UNITS TOTAL) BY MOUTH EVERY 7 DAYS FOR 12 WEEKS. (Patient not taking: Reported on 11/19/2015) 12 capsule 0   No current facility-administered medications on file prior to visit.     No Known Allergies  Family History  Problem Relation Age of Onset  . Arthritis Mother   . Osteoporosis Mother   . Diabetes Mother   . Diabetes Maternal Grandmother   . Diabetes Son   . Diabetes Maternal Aunt   . Diabetes Maternal Uncle     Social History   Social History  . Marital status: Legally Separated    Spouse name: N/A  . Number of children: 2  . Years of  education: N/A   Occupational History  .  [employer]   Social History Main Topics  . Smoking status: Never Smoker  . Smokeless tobacco: Never Used  . Alcohol use 1.8 oz/week    3 Cans of beer per week  . Drug use: No  . Sexual activity: Yes    Birth control/ protection: IUD   Other Topics Concern  . None   Social History Narrative   Has 44 yr old and 44 year old   Works as Dance movement psychotherapist at [employer]   Separated   She completed 2 years college   Enjoys entertaining friends    Review of Systems - See HPI.  All other ROS are negative.  BP (!) 158/116 (BP Location: Left Arm, Cuff Size: Normal)   Pulse 70   Temp 98.3 F (36.8 C) (Oral)   Resp 16   Ht 5\' 4"  (1.626 m)   Wt 154 lb 4 oz (70 kg)   SpO2 100%   BMI 26.48 kg/m   Physical Exam  Constitutional: She is oriented to person, place, and time and well-developed, well-nourished, and in no distress.  HENT:  Head: Normocephalic and atraumatic.  Eyes: Conjunctivae are normal.  Cardiovascular: Normal rate, regular rhythm, normal heart sounds and intact distal pulses.   Pulmonary/Chest: Effort normal and breath sounds normal.  Musculoskeletal:       Right shoulder: Normal.       Right elbow: She exhibits normal  range of motion, no swelling, no effusion and no deformity. Tenderness found. Lateral epicondyle tenderness noted. No radial head, no medial epicondyle and no olecranon process tenderness noted.  Neurological: She is alert and oriented to person, place, and time.  Skin: Skin is warm and dry. No rash noted.  Vitals reviewed.   Recent Results (from the past 2160 hour(s))  CBC     Status: None   Collection Time: 09/10/15  9:11 AM  Result Value Ref Range   WBC 9.0 4.0 - 10.5 K/uL   RBC 4.91 3.87 - 5.11 Mil/uL   Platelets 249.0 150.0 - 400.0 K/uL   Hemoglobin 14.2 12.0 - 15.0 g/dL   HCT 42.5 36.0 - 46.0 %   MCV 86.4 78.0 - 100.0 fl   MCHC 33.5 30.0 - 36.0 g/dL   RDW 13.7 11.5 - 15.5 %  Comprehensive  metabolic panel     Status: None   Collection Time: 09/10/15  9:11 AM  Result Value Ref Range   Sodium 137 135 - 145 mEq/L   Potassium 4.0 3.5 - 5.1 mEq/L   Chloride 105 96 - 112 mEq/L   CO2 27 19 - 32 mEq/L   Glucose, Bld 93 70 - 99 mg/dL   BUN 10 6 - 23 mg/dL   Creatinine, Ser 0.92 0.40 - 1.20 mg/dL   Total Bilirubin 0.8 0.2 - 1.2 mg/dL   Alkaline Phosphatase 55 39 - 117 U/L   AST 18 0 - 37 U/L   ALT 20 0 - 35 U/L   Total Protein 7.1 6.0 - 8.3 g/dL   Albumin 4.7 3.5 - 5.2 g/dL   Calcium 9.6 8.4 - 10.5 mg/dL   GFR 70.49 >60.00 mL/min  Hemoglobin A1c     Status: None   Collection Time: 09/10/15  9:11 AM  Result Value Ref Range   Hgb A1c MFr Bld 5.6 4.6 - 6.5 %    Comment: Glycemic Control Guidelines for People with Diabetes:Non Diabetic:  <6%Goal of Therapy: <7%Additional Action Suggested:  >8%   Lipid panel     Status: Abnormal   Collection Time: 09/10/15  9:11 AM  Result Value Ref Range   Cholesterol 198 0 - 200 mg/dL    Comment: ATP III Classification       Desirable:  < 200 mg/dL               Borderline High:  200 - 239 mg/dL          High:  > = 240 mg/dL   Triglycerides 62.0 0.0 - 149.0 mg/dL    Comment: Normal:  <150 mg/dLBorderline High:  150 - 199 mg/dL   HDL 47.70 >39.00 mg/dL   VLDL 12.4 0.0 - 40.0 mg/dL   LDL Cholesterol 138 (H) 0 - 99 mg/dL   Total CHOL/HDL Ratio 4     Comment:                Men          Women1/2 Average Risk     3.4          3.3Average Risk          5.0          4.42X Average Risk          9.6          7.13X Average Risk          15.0          11.0  NonHDL 150.00     Comment: NOTE:  Non-HDL goal should be 30 mg/dL higher than patient's LDL goal (i.e. LDL goal of < 70 mg/dL, would have non-HDL goal of < 100 mg/dL)  TSH     Status: None   Collection Time: 09/10/15  9:11 AM  Result Value Ref Range   TSH 1.41 0.35 - 4.50 uIU/mL  Urinalysis, Routine w reflex microscopic (not at [facility])     Status: Abnormal   Collection Time:  09/10/15  9:11 AM  Result Value Ref Range   Color, Urine YELLOW Yellow;Lt. Yellow   APPearance CLEAR Clear   Specific Gravity, Urine >=1.030 (A) 1.000 - 1.030   pH 5.5 5.0 - 8.0   Total Protein, Urine NEGATIVE Negative   Urine Glucose NEGATIVE Negative   Ketones, ur NEGATIVE Negative   Bilirubin Urine NEGATIVE Negative   Hgb urine dipstick NEGATIVE Negative   Urobilinogen, UA 0.2 0.0 - 1.0   Leukocytes, UA NEGATIVE Negative   Nitrite NEGATIVE Negative   WBC, UA 0-2/hpf 0-2/hpf   RBC / HPF none seen 0-2/hpf   Squamous Epithelial / LPF Rare(0-4/hpf) Rare(0-4/hpf)  Vitamin D (25 hydroxy)     Status: Abnormal   Collection Time: 09/10/15  9:11 AM  Result Value Ref Range   VITD 18.75 (L) 30.00 - 100.00 ng/mL    Assessment/Plan: 1. Lateral epicondylitis (tennis elbow), right Immobilization short-term discussed. Aleve twice daily. Rx Flexeril to use in the evening. Supportive measures reviewed. FU if not resolving.  2. Essential hypertension Noncompliant with medications. Asymptomatic. Again reiterated importance of taking medication and risk associated with such high, uncontrolled BP. She agrees to take medication as soon as she is home. FU scheduled for reassessment. Alarm signs/symptoms reviewed with patient.    Leeanne Rio, PA-C

## 2015-11-29 ENCOUNTER — Ambulatory Visit (INDEPENDENT_AMBULATORY_CARE_PROVIDER_SITE_OTHER): Payer: BLUE CROSS/BLUE SHIELD | Admitting: Physician Assistant

## 2015-11-29 ENCOUNTER — Encounter: Payer: Self-pay | Admitting: Physician Assistant

## 2015-11-29 VITALS — BP 122/92 | HR 75 | Temp 98.5°F | Resp 16 | Ht 64.0 in | Wt 150.4 lb

## 2015-11-29 DIAGNOSIS — M25521 Pain in right elbow: Secondary | ICD-10-CM | POA: Insufficient documentation

## 2015-11-29 DIAGNOSIS — I10 Essential (primary) hypertension: Secondary | ICD-10-CM

## 2015-11-29 MED ORDER — MELOXICAM 15 MG PO TABS: 15.0000 mg | ORAL_TABLET | Freq: Every day | ORAL | 0 refills | Status: DC

## 2015-11-29 NOTE — Patient Instructions (Signed)
Please keep taking your BP medication as directed. Stay active and limit salt intake.   Please take the Mobic once daily with food. You will be contacted for assessment by Sports Medicine. Make sure to continue limiting heavy lifting. Wear brace as directed.

## 2015-11-29 NOTE — Assessment & Plan Note (Signed)
Combination of epicondylitis and muscle strain. Has not responded to conservative measures. We'll Rx Mobic once daily with food. Referral to sports medicine for further management.

## 2015-11-29 NOTE — Progress Notes (Signed)
Patient presents to clinic today for follow-up of hypertension and epicondylitis of RUE.  Hypertension -- patient currently on enalapril-hydrochlorothiazide. Has previously been noncompliant with medication, resulting in an extremely elevated blood pressures at office visit patient was instructed to take her medication daily without fail. Endorses taking medicines as directed now. Patient denies chest pain, palpitations, lightheadedness, dizziness, vision changes or frequent headaches.  BP Readings from Last 3 Encounters:  11/29/15 (!) 122/92  11/19/15 (!) 158/116  09/10/15 (!) 140/100   Elbow pain -- patient notes some mild improvement in pain, but is still prevalent. Pain, as before, is exacerbated with pronation and supination of her arm. Denies new or worsening symptoms. Has taken medications as directed without improvement.  Past Medical History:  Diagnosis Date  . Anemia   . Heart murmur   . Hypertension   . Uterine fibroid     Current Outpatient Prescriptions on File Prior to Visit  Medication Sig Dispense Refill  . clotrimazole-betamethasone (LOTRISONE) cream Apply 1 application topically 2 (two) times daily. 30 g 0  . enalapril-hydrochlorothiazide (VASERETIC) 10-25 MG tablet TAKE 1 TABLET BY MOUTH EVERY DAY 90 tablet 1  . lubiprostone (AMITIZA) 24 MCG capsule Take 1 capsule (24 mcg total) by mouth 2 (two) times daily with a meal. 30 capsule 3  . Vitamin D, Ergocalciferol, (DRISDOL) 50000 units CAPS capsule TAKE 1 CAPSULE (50,000 UNITS TOTAL) BY MOUTH EVERY 7 DAYS FOR 12 WEEKS. 12 capsule 0   No current facility-administered medications on file prior to visit.     No Known Allergies  Family History  Problem Relation Age of Onset  . Arthritis Mother   . Osteoporosis Mother   . Diabetes Mother   . Diabetes Maternal Grandmother   . Diabetes Son   . Diabetes Maternal Aunt   . Diabetes Maternal Uncle     Social History   Social History  . Marital status: Legally  Separated    Spouse name: N/A  . Number of children: 2  . Years of education: N/A   Occupational History  .  [employer]   Social History Main Topics  . Smoking status: Never Smoker  . Smokeless tobacco: Never Used  . Alcohol use 1.8 oz/week    3 Cans of beer per week  . Drug use: No  . Sexual activity: Yes    Birth control/ protection: IUD   Other Topics Concern  . None   Social History Narrative   Has 44 yr old and 44 year old   Works as Dance movement psychotherapist at [employer]   Separated   She completed 2 years college   Enjoys entertaining friends    Review of Systems - See HPI.  All other ROS are negative.  BP (!) 122/92 (BP Location: Left Arm, Patient Position: Sitting, Cuff Size: Normal)   Pulse 75   Temp 98.5 F (36.9 C) (Oral)   Resp 16   Ht 5\' 4"  (1.626 m)   Wt 150 lb 6 oz (68.2 kg)   SpO2 99%   BMI 25.81 kg/m   Physical Exam  Constitutional: She is oriented to person, place, and time and well-developed, well-nourished, and in no distress.  HENT:  Head: Normocephalic and atraumatic.  Eyes: Conjunctivae are normal.  Cardiovascular: Normal rate, regular rhythm, normal heart sounds and intact distal pulses.   Pulmonary/Chest: Effort normal and breath sounds normal. No respiratory distress. She has no wheezes. She has no rales. She exhibits no tenderness.  Musculoskeletal:  Right elbow: She exhibits normal range of motion. Tenderness found. Medial epicondyle and lateral epicondyle tenderness noted.       Right forearm: She exhibits tenderness and bony tenderness. She exhibits no swelling.  Neurological: She is alert and oriented to person, place, and time.  Skin: Skin is warm and dry. No rash noted.  Psychiatric: Affect normal.  Vitals reviewed.   Recent Results (from the past 2160 hour(s))  CBC     Status: None   Collection Time: 09/10/15  9:11 AM  Result Value Ref Range   WBC 9.0 4.0 - 10.5 K/uL   RBC 4.91 3.87 - 5.11 Mil/uL   Platelets 249.0 150.0 -  400.0 K/uL   Hemoglobin 14.2 12.0 - 15.0 g/dL   HCT 42.5 36.0 - 46.0 %   MCV 86.4 78.0 - 100.0 fl   MCHC 33.5 30.0 - 36.0 g/dL   RDW 13.7 11.5 - 15.5 %  Comprehensive metabolic panel     Status: None   Collection Time: 09/10/15  9:11 AM  Result Value Ref Range   Sodium 137 135 - 145 mEq/L   Potassium 4.0 3.5 - 5.1 mEq/L   Chloride 105 96 - 112 mEq/L   CO2 27 19 - 32 mEq/L   Glucose, Bld 93 70 - 99 mg/dL   BUN 10 6 - 23 mg/dL   Creatinine, Ser 0.92 0.40 - 1.20 mg/dL   Total Bilirubin 0.8 0.2 - 1.2 mg/dL   Alkaline Phosphatase 55 39 - 117 U/L   AST 18 0 - 37 U/L   ALT 20 0 - 35 U/L   Total Protein 7.1 6.0 - 8.3 g/dL   Albumin 4.7 3.5 - 5.2 g/dL   Calcium 9.6 8.4 - 10.5 mg/dL   GFR 70.49 >60.00 mL/min  Hemoglobin A1c     Status: None   Collection Time: 09/10/15  9:11 AM  Result Value Ref Range   Hgb A1c MFr Bld 5.6 4.6 - 6.5 %    Comment: Glycemic Control Guidelines for People with Diabetes:Non Diabetic:  <6%Goal of Therapy: <7%Additional Action Suggested:  >8%   Lipid panel     Status: Abnormal   Collection Time: 09/10/15  9:11 AM  Result Value Ref Range   Cholesterol 198 0 - 200 mg/dL    Comment: ATP III Classification       Desirable:  < 200 mg/dL               Borderline High:  200 - 239 mg/dL          High:  > = 240 mg/dL   Triglycerides 62.0 0.0 - 149.0 mg/dL    Comment: Normal:  <150 mg/dLBorderline High:  150 - 199 mg/dL   HDL 47.70 >39.00 mg/dL   VLDL 12.4 0.0 - 40.0 mg/dL   LDL Cholesterol 138 (H) 0 - 99 mg/dL   Total CHOL/HDL Ratio 4     Comment:                Men          Women1/2 Average Risk     3.4          3.3Average Risk          5.0          4.42X Average Risk          9.6          7.13X Average Risk          15.0  11.0                       NonHDL 150.00     Comment: NOTE:  Non-HDL goal should be 30 mg/dL higher than patient's LDL goal (i.e. LDL goal of < 70 mg/dL, would have non-HDL goal of < 100 mg/dL)  TSH     Status: None   Collection Time:  09/10/15  9:11 AM  Result Value Ref Range   TSH 1.41 0.35 - 4.50 uIU/mL  Urinalysis, Routine w reflex microscopic (not at [facility])     Status: Abnormal   Collection Time: 09/10/15  9:11 AM  Result Value Ref Range   Color, Urine YELLOW Yellow;Lt. Yellow   APPearance CLEAR Clear   Specific Gravity, Urine >=1.030 (A) 1.000 - 1.030   pH 5.5 5.0 - 8.0   Total Protein, Urine NEGATIVE Negative   Urine Glucose NEGATIVE Negative   Ketones, ur NEGATIVE Negative   Bilirubin Urine NEGATIVE Negative   Hgb urine dipstick NEGATIVE Negative   Urobilinogen, UA 0.2 0.0 - 1.0   Leukocytes, UA NEGATIVE Negative   Nitrite NEGATIVE Negative   WBC, UA 0-2/hpf 0-2/hpf   RBC / HPF none seen 0-2/hpf   Squamous Epithelial / LPF Rare(0-4/hpf) Rare(0-4/hpf)  Vitamin D (25 hydroxy)     Status: Abnormal   Collection Time: 09/10/15  9:11 AM  Result Value Ref Range   VITD 18.75 (L) 30.00 - 100.00 ng/mL    Assessment/Plan: HTN (hypertension) BP much improved now that patient is compliant with medications. We'll continue current regimen. Follow-up in 6 months. We'll obtain repeat lab work at that time.  Right elbow pain Combination of epicondylitis and muscle strain. Has not responded to conservative measures. We'll Rx Mobic once daily with food. Referral to sports medicine for further management.    Leeanne Rio, PA-C

## 2015-11-29 NOTE — Assessment & Plan Note (Signed)
BP much improved now that patient is compliant with medications. We'll continue current regimen. Follow-up in 6 months. We'll obtain repeat lab work at that time.

## 2015-11-30 ENCOUNTER — Ambulatory Visit: Payer: BLUE CROSS/BLUE SHIELD | Admitting: Family Medicine

## 2015-12-08 ENCOUNTER — Encounter: Payer: Self-pay | Admitting: Family Medicine

## 2015-12-08 ENCOUNTER — Ambulatory Visit (INDEPENDENT_AMBULATORY_CARE_PROVIDER_SITE_OTHER): Payer: BLUE CROSS/BLUE SHIELD | Admitting: Family Medicine

## 2015-12-08 DIAGNOSIS — M25521 Pain in right elbow: Secondary | ICD-10-CM

## 2015-12-08 MED ORDER — NITROGLYCERIN 0.2 MG/HR TD PT24: MEDICATED_PATCH | TRANSDERMAL | 1 refills | Status: DC

## 2015-12-08 NOTE — Patient Instructions (Addendum)
You have strained the extensor tendon of your elbow and have a partial tear here. Try to avoid painful activities as much as possible. Ice the area 3-4 times a day for 15 minutes at a time. Meloxicam 15mg  daily with food for pain and inflammation. Compression sleeve during the day and when at work. Hammer rotation exercise, wrist extension exercise with 1 pound weight - 3 sets of 10 once a day - ok to work your way up to this. Stretching - hold for 20-30 seconds and repeat 3 times. Nitro patches 1/4th patch over affected elbow, change daily. Consider physical therapy if not improving. Follow up in 6 weeks.
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
## 2015-12-09 NOTE — Assessment & Plan Note (Signed)
2/2 partial extensor tendon tear, more than would be expected with lateral epicondylitis.  Icing, meloxicam, compression sleeve.  Shown home exercises and stretches to do daily.  She will also start nitro patches, change daily (discussed risks of headache, skin irritation).  Consider PT if not improving.  F/u in 6 weeks.

## 2016-01-19 ENCOUNTER — Ambulatory Visit: Payer: BLUE CROSS/BLUE SHIELD | Admitting: Family Medicine

## 2016-03-18 DIAGNOSIS — J209 Acute bronchitis, unspecified: Secondary | ICD-10-CM | POA: Diagnosis not present

## 2016-04-24 HISTORY — PX: UMBILICAL HERNIA REPAIR: SHX196

## 2016-05-29 ENCOUNTER — Ambulatory Visit: Payer: BLUE CROSS/BLUE SHIELD | Admitting: Physician Assistant

## 2016-06-15 DIAGNOSIS — Z3202 Encounter for pregnancy test, result negative: Secondary | ICD-10-CM | POA: Diagnosis not present

## 2016-06-15 DIAGNOSIS — N644 Mastodynia: Secondary | ICD-10-CM | POA: Diagnosis not present

## 2016-06-15 DIAGNOSIS — N393 Stress incontinence (female) (male): Secondary | ICD-10-CM | POA: Diagnosis not present

## 2016-06-15 DIAGNOSIS — K640 First degree hemorrhoids: Secondary | ICD-10-CM | POA: Diagnosis not present

## 2016-06-15 DIAGNOSIS — R32 Unspecified urinary incontinence: Secondary | ICD-10-CM | POA: Diagnosis not present

## 2016-07-11 DIAGNOSIS — N3941 Urge incontinence: Secondary | ICD-10-CM | POA: Diagnosis not present

## 2016-07-11 DIAGNOSIS — D251 Intramural leiomyoma of uterus: Secondary | ICD-10-CM | POA: Diagnosis not present

## 2016-07-11 DIAGNOSIS — D252 Subserosal leiomyoma of uterus: Secondary | ICD-10-CM | POA: Diagnosis not present

## 2016-07-11 DIAGNOSIS — N393 Stress incontinence (female) (male): Secondary | ICD-10-CM | POA: Diagnosis not present

## 2016-08-02 DIAGNOSIS — N921 Excessive and frequent menstruation with irregular cycle: Secondary | ICD-10-CM | POA: Diagnosis not present

## 2016-08-02 DIAGNOSIS — Z01419 Encounter for gynecological examination (general) (routine) without abnormal findings: Secondary | ICD-10-CM | POA: Diagnosis not present

## 2016-08-02 DIAGNOSIS — Z124 Encounter for screening for malignant neoplasm of cervix: Secondary | ICD-10-CM | POA: Diagnosis not present

## 2016-08-02 DIAGNOSIS — D259 Leiomyoma of uterus, unspecified: Secondary | ICD-10-CM | POA: Insufficient documentation

## 2016-08-02 DIAGNOSIS — N393 Stress incontinence (female) (male): Secondary | ICD-10-CM | POA: Insufficient documentation

## 2016-08-02 DIAGNOSIS — N8110 Cystocele, unspecified: Secondary | ICD-10-CM | POA: Diagnosis not present

## 2016-08-16 DIAGNOSIS — Z3202 Encounter for pregnancy test, result negative: Secondary | ICD-10-CM | POA: Diagnosis not present

## 2016-08-16 DIAGNOSIS — Z30432 Encounter for removal of intrauterine contraceptive device: Secondary | ICD-10-CM | POA: Diagnosis not present

## 2016-08-16 DIAGNOSIS — R102 Pelvic and perineal pain: Secondary | ICD-10-CM | POA: Diagnosis not present

## 2016-08-16 DIAGNOSIS — D252 Subserosal leiomyoma of uterus: Secondary | ICD-10-CM | POA: Diagnosis not present

## 2016-08-16 DIAGNOSIS — D251 Intramural leiomyoma of uterus: Secondary | ICD-10-CM | POA: Diagnosis not present

## 2016-08-28 DIAGNOSIS — N921 Excessive and frequent menstruation with irregular cycle: Secondary | ICD-10-CM | POA: Diagnosis not present

## 2016-08-28 DIAGNOSIS — Z3202 Encounter for pregnancy test, result negative: Secondary | ICD-10-CM | POA: Diagnosis not present

## 2016-08-28 DIAGNOSIS — D250 Submucous leiomyoma of uterus: Secondary | ICD-10-CM | POA: Diagnosis not present

## 2016-08-28 DIAGNOSIS — D251 Intramural leiomyoma of uterus: Secondary | ICD-10-CM | POA: Diagnosis not present

## 2016-08-31 DIAGNOSIS — Z30430 Encounter for insertion of intrauterine contraceptive device: Secondary | ICD-10-CM | POA: Diagnosis not present

## 2016-08-31 DIAGNOSIS — Z3202 Encounter for pregnancy test, result negative: Secondary | ICD-10-CM | POA: Diagnosis not present

## 2016-12-07 ENCOUNTER — Encounter: Payer: Self-pay | Admitting: Medical

## 2016-12-07 ENCOUNTER — Ambulatory Visit (INDEPENDENT_AMBULATORY_CARE_PROVIDER_SITE_OTHER): Payer: BLUE CROSS/BLUE SHIELD | Admitting: Medical

## 2016-12-07 VITALS — BP 180/105 | HR 74 | Temp 99.4°F | Resp 16 | Ht 64.0 in | Wt 164.8 lb

## 2016-12-07 DIAGNOSIS — J40 Bronchitis, not specified as acute or chronic: Secondary | ICD-10-CM

## 2016-12-07 DIAGNOSIS — J301 Allergic rhinitis due to pollen: Secondary | ICD-10-CM | POA: Diagnosis not present

## 2016-12-07 DIAGNOSIS — K429 Umbilical hernia without obstruction or gangrene: Secondary | ICD-10-CM

## 2016-12-07 DIAGNOSIS — E785 Hyperlipidemia, unspecified: Secondary | ICD-10-CM | POA: Diagnosis not present

## 2016-12-07 DIAGNOSIS — I10 Essential (primary) hypertension: Secondary | ICD-10-CM

## 2016-12-07 LAB — LIPID PANEL
CHOL/HDL RATIO: 4
Cholesterol: 180 mg/dL (ref 0–200)
HDL: 46.4 mg/dL (ref >39.00)
LDL CALC: 117 mg/dL — AB (ref 0–99)
NONHDL: 133.26
Triglycerides: 79 mg/dL (ref 0.0–149.0)
VLDL: 15.8 mg/dL (ref 0.0–40.0)

## 2016-12-07 LAB — COMPLETE METABOLIC PANEL WITH GFR
ALBUMIN: 4.4 g/dL (ref 3.6–5.1)
ALT: 11 U/L (ref 6–29)
AST: 12 U/L (ref 10–35)
Alkaline Phosphatase: 65 U/L (ref 33–115)
BUN: 14 mg/dL (ref 7–25)
CALCIUM: 9.7 mg/dL (ref 8.6–10.2)
CHLORIDE: 104 mmol/L (ref 98–110)
CO2: 25 mmol/L (ref 20–32)
Creat: 0.94 mg/dL (ref 0.50–1.10)
GFR, Est African American: 85 mL/min (ref >=60)
GFR, Est Non African American: 74 mL/min (ref >=60)
GLUCOSE: 97 mg/dL (ref 65–99)
Potassium: 4.2 mmol/L (ref 3.5–5.3)
Sodium: 138 mmol/L (ref 135–146)
Total Bilirubin: 0.6 mg/dL (ref 0.2–1.2)
Total Protein: 6.8 g/dL (ref 6.1–8.1)

## 2016-12-07 MED ORDER — AZITHROMYCIN 250 MG PO TABS: ORAL_TABLET | ORAL | 0 refills | Status: DC

## 2016-12-07 MED ORDER — LEVOCETIRIZINE DIHYDROCHLORIDE 5 MG PO TABS: 5.0000 mg | ORAL_TABLET | Freq: Every evening | ORAL | 0 refills | Status: DC

## 2016-12-07 MED ORDER — FLUTICASONE PROPIONATE 50 MCG/ACT NA SUSP: 2.0000 | Freq: Every day | NASAL | 1 refills | Status: DC

## 2016-12-07 MED ORDER — BENZONATATE 100 MG PO CAPS: 100.0000 mg | ORAL_CAPSULE | Freq: Three times a day (TID) | ORAL | 0 refills | Status: DC | PRN

## 2016-12-07 MED ORDER — LOSARTAN POTASSIUM-HCTZ 100-25 MG PO TABS: 1.0000 | ORAL_TABLET | Freq: Every day | ORAL | 11 refills | Status: DC

## 2016-12-07 NOTE — Progress Notes (Signed)
Subjective:    Patient ID: Debra Ayers, female    DOB: [birth year], 46 y.o.   MRN: [id number]  HPI  Pt in for evaluation. She has been off of med for 3 months. She states no neurologic or cardiac signs or symptoms. She states she has been on med intermittently for 5 years. In past sometimes when off med and bp increased would feel light headed and dizzy.   LMP-Pt is on mirena. Got replaced 3 months ago.  Pt has a cough for one week. She nasal congestion with pnd. Pt has some dry cough. Mild st. Pt states beginning to get some chest congestion. Pt does not smoke. But some second hand exposure.  Also she has umbilical hernia. Found by Psychiatric nurse. But pt never noticed it. Mild faint pain on if touches area directly. Pt told by surgeon can't do liposuction until she get umbilical hernia repaired.  Pt ldl in past has been high in past. Will check today.    Review of Systems  Constitutional: Negative for chills, fatigue and fever.  HENT: Positive for congestion and postnasal drip. Negative for ear pain, hearing loss, nosebleeds and sneezing.   Respiratory: Positive for cough. Negative for chest tightness, shortness of breath and wheezing.   Cardiovascular: Negative for chest pain and palpitations.  Gastrointestinal: Negative for abdominal pain, blood in stool, constipation, diarrhea, nausea, rectal pain and vomiting.       See hpi.  Musculoskeletal: Negative for back pain.  Skin: Negative for rash.  Neurological: Negative for dizziness, tremors, syncope, speech difficulty, weakness, light-headedness, numbness and headaches.  Hematological: Negative for adenopathy. Does not bruise/bleed easily.  Psychiatric/Behavioral: Negative for behavioral problems, confusion and self-injury. The patient is not nervous/anxious.     Past Medical History:  Diagnosis Date  . Anemia   . Heart murmur   . Hypertension   . Uterine fibroid      Social History   Social History  . Marital  status: Legally Separated    Spouse name: N/A  . Number of children: 2  . Years of education: N/A   Occupational History  .  [employer]   Social History Main Topics  . Smoking status: Never Smoker  . Smokeless tobacco: Never Used  . Alcohol use 1.8 oz/week    3 Cans of beer per week  . Drug use: No  . Sexual activity: Yes    Birth control/ protection: IUD   Other Topics Concern  . Not on file   Social History Narrative   Has 45 yr old and 45 year old   Works as Dance movement psychotherapist at [employer]   Separated   She completed 2 years college   Enjoys entertaining friends    Past Surgical History:  Procedure Laterality Date  . BACK SURGERY    . BREAST SURGERY  2000   Left breast biopsy, benign  . TUBAL LIGATION      Family History  Problem Relation Age of Onset  . Arthritis Mother   . Osteoporosis Mother   . Diabetes Mother   . Diabetes Maternal Grandmother   . Diabetes Son   . Diabetes Maternal Aunt   . Diabetes Maternal Uncle     No Known Allergies  Current Outpatient Prescriptions on File Prior to Visit  Medication Sig Dispense Refill  . clotrimazole-betamethasone (LOTRISONE) cream Apply 1 application topically 2 (two) times daily. (Patient not taking: Reported on 12/07/2016) 30 g 0  . enalapril-hydrochlorothiazide (VASERETIC) 10-25 MG  tablet TAKE 1 TABLET BY MOUTH EVERY DAY (Patient not taking: Reported on 12/07/2016) 90 tablet 1  . lubiprostone (AMITIZA) 24 MCG capsule Take 1 capsule (24 mcg total) by mouth 2 (two) times daily with a meal. (Patient not taking: Reported on 12/07/2016) 30 capsule 3  . meloxicam (MOBIC) 15 MG tablet Take 1 tablet (15 mg total) by mouth daily. (Patient not taking: Reported on 12/07/2016) 30 tablet 0  . nitroGLYCERIN (NITRODUR - DOSED IN MG/24 HR) 0.2 mg/hr patch Apply 1/4th patch to affected elbow, change daily (Patient not taking: Reported on 12/07/2016) 30 patch 1  . Vitamin D, Ergocalciferol, (DRISDOL) 50000 units CAPS capsule TAKE 1  CAPSULE (50,000 UNITS TOTAL) BY MOUTH EVERY 7 DAYS FOR 12 WEEKS. (Patient not taking: Reported on 12/07/2016) 12 capsule 0   No current facility-administered medications on file prior to visit.     BP (!) 184/113   Pulse 74   Temp 99.4 F (37.4 C) (Oral)   Resp 16   Ht 5\' 4"  (1.626 m)   Wt 164 lb 12.8 oz (74.8 kg)   SpO2 100%   BMI 28.29 kg/m       Objective:   Physical Exam  General  Mental Status - Alert. General Appearance - Well groomed. Not in acute distress.  Skin Rashes- No Rashes.  HEENT Head- Normal. Ear Auditory Canal - Left- Normal. Right - Normal.Tympanic Membrane- Left- Normal. Right- Normal. Eye Sclera/Conjunctiva- Left- Normal. Right- Normal. Nose & Sinuses Nasal Mucosa- Left-  Boggy and Congested. Right-  Boggy and  Congested.Bilateral not maxillary and  No frontal sinus pressure. Mouth & Throat Lips: Upper Lip- Normal: no dryness, cracking, pallor, cyanosis, or vesicular eruption. Lower Lip-Normal: no dryness, cracking, pallor, cyanosis or vesicular eruption. Buccal Mucosa- Bilateral- No Aphthous ulcers. Oropharynx- No Discharge or Erythema. +pnd Tonsils: Characteristics- Bilateral- No Erythema or Congestion. Size/Enlargement- Bilateral- No enlargement. Discharge- bilateral-None.  Neck Neck- Supple. No Masses.   Chest and Lung Exam Auscultation: Breath Sounds:-Clear even and unlabored.  Cardiovascular Auscultation:Rythm- Regular, rate and rhythm. Murmurs & Other Heart Sounds:Ausculatation of the heart reveal- No Murmurs.  Lymphatic Head & Neck General Head & Neck Lymphatics: Bilateral: Description- No Localized lymphadenopathy.   Neurologic Cranial Nerve exam:- CN III-XII intact(No nystagmus), symmetric smile. Finger to Nose:- Normal/Intact Strength:- 5/5 equal and symmetric strength both upper and lower extremities.  Abdomen- faint small umblical hernia, barely noticeable. But felt on straight leg lift.     Assessment & Plan:  For  htn will rx losartan 100/25. Please take medication. Important to reduce chance of stroke, MI, kidney problems and vision problems. If before we recheck your bp you get cardiac or neurologic signs or symptoms then ED evaluation.   For low ldl history get cmp and lipid panel today.  For likely allergic rhinitis rx flonase an xyzal. One refill if needed.  For early bronchitis azithromycin. For cough benzonatate  For umbilical hernia will refer to surgeon. Can call krisit or jennifer in our office for update.  Follow up this coming  Tuesday for bp check. Also getting otc bp cuff otc would be helpful going forward to follow BP.  Kashawna Manzer, Percell Miller, PA-C

## 2016-12-07 NOTE — Patient Instructions (Addendum)
For htn will rx losartan 100/25. Please take medication. Important to reduce chance of stroke, MI, kidney problems and vision problems. If before we recheck your bp you get cardiac or neurologic signs or symptoms then ED evaluation.   For low ldl history get cmp and lipid panel today.  For likely allergic rhinitis rx flonase an xyzal. One refill if needed.  For early bronchitis azithromycin. For cough benzonatate.  For umbilical hernia will refer to surgeon. Can call krisit or jennifer in our office for update.  Follow up this coming tuesday for bp check. Also getting otc bp cuff otc would be helpful going forward to follow BP.

## 2016-12-12 ENCOUNTER — Encounter: Payer: Self-pay | Admitting: Medical

## 2016-12-12 ENCOUNTER — Ambulatory Visit (INDEPENDENT_AMBULATORY_CARE_PROVIDER_SITE_OTHER): Payer: BLUE CROSS/BLUE SHIELD | Admitting: Medical

## 2016-12-12 VITALS — BP 130/80 | HR 81 | Temp 98.3°F | Resp 16 | Ht 64.0 in | Wt 165.8 lb

## 2016-12-12 DIAGNOSIS — I10 Essential (primary) hypertension: Secondary | ICD-10-CM

## 2016-12-12 DIAGNOSIS — R7989 Other specified abnormal findings of blood chemistry: Secondary | ICD-10-CM

## 2016-12-12 DIAGNOSIS — E559 Vitamin D deficiency, unspecified: Secondary | ICD-10-CM | POA: Diagnosis not present

## 2016-12-12 DIAGNOSIS — E785 Hyperlipidemia, unspecified: Secondary | ICD-10-CM | POA: Diagnosis not present

## 2016-12-12 NOTE — Patient Instructions (Addendum)
For hypertension continue the losartan. Rx refills.   For ldl elevation start diet and exercise.  For low vit d start rx vit d. Repeat level in 3 months.  Benzonatate for residual cough.(you had before losartan). She just started antibiotic.  Follow up in 3 months or as needed

## 2016-12-12 NOTE — Progress Notes (Signed)
Subjective:    Patient ID: Unknown Jim, female    DOB: 02-22-1971, 46 y.o.   MRN: [id number]  HPI  Pt in for bp check. BP is a lot better. No cardiac or neurologic signs or symptoms.   Pt ldl is mild high. Advised diet and exercise.   Pt vitamin D is low and I sent in rx to pharmacy. She will get that at pharmacy and repeat level in 3 months.    Review of Systems  Constitutional: Negative for appetite change and fatigue.  Respiratory: Negative for chest tightness and shortness of breath.   Cardiovascular: Negative for chest pain and palpitations.  Gastrointestinal: Negative for abdominal pain.  Genitourinary: Negative for difficulty urinating, dysuria and flank pain.  Musculoskeletal: Negative for back pain, joint swelling, myalgias and neck stiffness.  Skin: Negative for rash.  Neurological: Negative for dizziness, speech difficulty, weakness, numbness and headaches.  Hematological: Negative for adenopathy. Does not bruise/bleed easily.  Psychiatric/Behavioral: Negative for behavioral problems, confusion, sleep disturbance and suicidal ideas. The patient is not nervous/anxious.    Past Medical History:  Diagnosis Date  . Anemia   . Heart murmur   . Hypertension   . Uterine fibroid      Social History   Social History  . Marital status: Legally Separated    Spouse name: N/A  . Number of children: 2  . Years of education: N/A   Occupational History  .  [employer]   Social History Main Topics  . Smoking status: Never Smoker  . Smokeless tobacco: Never Used  . Alcohol use 1.8 oz/week    3 Cans of beer per week  . Drug use: No  . Sexual activity: Yes    Birth control/ protection: IUD   Other Topics Concern  . Not on file   Social History Narrative   Has 45 yr old and 45 year old   Works as Dance movement psychotherapist at [employer]   Separated   She completed 2 years college   Enjoys entertaining friends    Past Surgical History:  Procedure Laterality Date  .  BACK SURGERY    . BREAST SURGERY  2000   Left breast biopsy, benign  . TUBAL LIGATION      Family History  Problem Relation Age of Onset  . Arthritis Mother   . Osteoporosis Mother   . Diabetes Mother   . Diabetes Maternal Grandmother   . Diabetes Son   . Diabetes Maternal Aunt   . Diabetes Maternal Uncle     No Known Allergies  Current Outpatient Prescriptions on File Prior to Visit  Medication Sig Dispense Refill  . azithromycin (ZITHROMAX) 250 MG tablet Take 2 tablets by mouth on day 1, followed by 1 tablet by mouth daily for 4 days. 6 tablet 0  . benzonatate (TESSALON) 100 MG capsule Take 1 capsule (100 mg total) by mouth 3 (three) times daily as needed for cough. 21 capsule 0  . clotrimazole-betamethasone (LOTRISONE) cream Apply 1 application topically 2 (two) times daily. 30 g 0  . fluticasone (FLONASE) 50 MCG/ACT nasal spray Place 2 sprays into both nostrils daily. 16 g 1  . levocetirizine (XYZAL) 5 MG tablet Take 1 tablet (5 mg total) by mouth every evening. 30 tablet 0  . losartan-hydrochlorothiazide (HYZAAR) 100-25 MG tablet Take 1 tablet by mouth daily. 30 tablet 11  . lubiprostone (AMITIZA) 24 MCG capsule Take 1 capsule (24 mcg total) by mouth 2 (two) times daily with  a meal. 30 capsule 3  . meloxicam (MOBIC) 15 MG tablet Take 1 tablet (15 mg total) by mouth daily. 30 tablet 0  . nitroGLYCERIN (NITRODUR - DOSED IN MG/24 HR) 0.2 mg/hr patch Apply 1/4th patch to affected elbow, change daily 30 patch 1  . Vitamin D, Ergocalciferol, (DRISDOL) 50000 units CAPS capsule TAKE 1 CAPSULE (50,000 UNITS TOTAL) BY MOUTH EVERY 7 DAYS FOR 12 WEEKS. 12 capsule 0   No current facility-administered medications on file prior to visit.     BP 136/87   Pulse 81   Temp 98.3 F (36.8 C) (Oral)   Resp 16   Ht 5\' 4"  (1.626 m)   Wt 165 lb 12.8 oz (75.2 kg)   SpO2 100%   BMI 28.46 kg/m       Objective:   Physical Exam  General Mental Status- Alert. General Appearance- Not in  acute distress.   Skin General: Color- Normal Color. Moisture- Normal Moisture.  Neck Carotid Arteries- Normal color. Moisture- Normal Moisture. No carotid bruits. No JVD.  Chest and Lung Exam Auscultation: Breath Sounds:-Normal.  Cardiovascular Auscultation:Rythm- Regular. Murmurs & Other Heart Sounds:Auscultation of the heart reveals- No Murmurs.  Abdomen Inspection:-Inspeection Normal. Palpation/Percussion:Note:No mass. Palpation and Percussion of the abdomen reveal- Non Tender, Non Distended + BS, no rebound or guarding.   Neurologic Cranial Nerve exam:- CN III-XII intact(No nystagmus), symmetric smile. Strength:- 5/5 equal and symmetric strength both upper and lower extremities.      Assessment & Plan:  For hypertension continue the losartan. Rx refills.   For ldl elevation start diet and exercise.  For low vit d start rx vit d. Repeat level in 3 months.  Benzonatate for residual cough.(you had before losartan). Just started antibiotic.  Follow up in 3 months or as needed  Makinze Jani, Percell Miller, [employer]

## 2016-12-14 ENCOUNTER — Other Ambulatory Visit: Payer: Self-pay

## 2016-12-14 NOTE — Progress Notes (Signed)
Opened in errror 

## 2016-12-29 DIAGNOSIS — K429 Umbilical hernia without obstruction or gangrene: Secondary | ICD-10-CM | POA: Diagnosis not present

## 2017-02-09 DIAGNOSIS — K429 Umbilical hernia without obstruction or gangrene: Secondary | ICD-10-CM | POA: Diagnosis not present

## 2017-02-09 DIAGNOSIS — K420 Umbilical hernia with obstruction, without gangrene: Secondary | ICD-10-CM | POA: Diagnosis not present

## 2017-03-27 ENCOUNTER — Encounter: Payer: Self-pay | Admitting: Medical

## 2017-03-27 ENCOUNTER — Ambulatory Visit (INDEPENDENT_AMBULATORY_CARE_PROVIDER_SITE_OTHER): Payer: BLUE CROSS/BLUE SHIELD | Admitting: Medical

## 2017-03-27 VITALS — BP 160/98 | HR 66 | Temp 97.9°F | Resp 16 | Wt 160.4 lb

## 2017-03-27 DIAGNOSIS — E785 Hyperlipidemia, unspecified: Secondary | ICD-10-CM

## 2017-03-27 DIAGNOSIS — R739 Hyperglycemia, unspecified: Secondary | ICD-10-CM

## 2017-03-27 DIAGNOSIS — I10 Essential (primary) hypertension: Secondary | ICD-10-CM

## 2017-03-27 LAB — COMPREHENSIVE METABOLIC PANEL
ALBUMIN: 4.5 g/dL (ref 3.5–5.2)
ALK PHOS: 60 U/L (ref 39–117)
ALT: 11 U/L (ref 0–35)
AST: 13 U/L (ref 0–37)
BUN: 11 mg/dL (ref 6–23)
CHLORIDE: 105 meq/L (ref 96–112)
CO2: 27 mEq/L (ref 19–32)
CREATININE: 0.84 mg/dL (ref 0.40–1.20)
Calcium: 9.4 mg/dL (ref 8.4–10.5)
GFR: 77.75 mL/min (ref >60.00)
GLUCOSE: 93 mg/dL (ref 70–99)
Potassium: 4.2 mEq/L (ref 3.5–5.1)
SODIUM: 137 meq/L (ref 135–145)
TOTAL PROTEIN: 7.1 g/dL (ref 6.0–8.3)
Total Bilirubin: 0.5 mg/dL (ref 0.2–1.2)

## 2017-03-27 LAB — LIPID PANEL
CHOLESTEROL: 176 mg/dL (ref 0–200)
HDL: 45.3 mg/dL (ref >39.00)
LDL CALC: 122 mg/dL — AB (ref 0–99)
NonHDL: 130.62
TRIGLYCERIDES: 45 mg/dL (ref 0.0–149.0)
Total CHOL/HDL Ratio: 4
VLDL: 9 mg/dL (ref 0.0–40.0)

## 2017-03-27 LAB — HEMOGLOBIN A1C: HEMOGLOBIN A1C: 5.6 % (ref 4.6–6.5)

## 2017-03-27 MED ORDER — HYDROCHLOROTHIAZIDE 12.5 MG PO CAPS: 12.5000 mg | ORAL_CAPSULE | Freq: Every day | ORAL | 3 refills | Status: DC

## 2017-03-27 MED ORDER — LOSARTAN POTASSIUM 100 MG PO TABS: 100.0000 mg | ORAL_TABLET | Freq: Every day | ORAL | 3 refills | Status: DC

## 2017-03-27 MED ORDER — AMLODIPINE BESYLATE 10 MG PO TABS: 10.0000 mg | ORAL_TABLET | Freq: Every day | ORAL | 3 refills | Status: DC

## 2017-03-27 NOTE — Progress Notes (Signed)
Subjective:    Patient ID: Debra Ayers, female    DOB: 1971/02/02, 46 y.o.   MRN: [id number]  HPI  Pt in for a follow up.  Pt states she wants to change medicine for her bp. She states she never checks her bp. In addition she is urinating a lot which she assumes is from bp medication which has diuretic.  No associated UTI type signs and symptoms on review associated  No cardiac or neurologic signs or symptoms.  Pt lmp- 3 years ago. She is on mirena. Got new mirena this summer.   Pt did not get flu vaccine this year. She states she declines.   Review of Systems  Constitutional: Negative for chills, fatigue and fever.  HENT: Negative for congestion and ear discharge.   Respiratory: Negative for chest tightness, shortness of breath and wheezing.   Cardiovascular: Negative for chest pain and palpitations.  Gastrointestinal: Negative for abdominal distention and anal bleeding.  Endocrine: Positive for polyuria. Negative for polydipsia.  Genitourinary: Positive for frequency. Negative for decreased urine volume, difficulty urinating, dysuria, flank pain, hematuria, menstrual problem and urgency.  Musculoskeletal: Negative for back pain, joint swelling and neck stiffness.  Skin: Negative for rash.  Neurological: Negative for dizziness, speech difficulty, weakness, numbness and headaches.  Hematological: Negative for adenopathy. Does not bruise/bleed easily.  Psychiatric/Behavioral: Negative for behavioral problems, confusion and sleep disturbance. The patient is not nervous/anxious.     Past Medical History:  Diagnosis Date  . Anemia   . Heart murmur   . Hypertension   . Uterine fibroid      Social History   Socioeconomic History  . Marital status: Legally Separated    Spouse name: Not on file  . Number of children: 2  . Years of education: Not on file  . Highest education level: Not on file  Social Needs  . Financial resource strain: Not on file  . Food insecurity  - worry: Not on file  . Food insecurity - inability: Not on file  . Transportation needs - medical: Not on file  . Transportation needs - non-medical: Not on file  Occupational History    Employer: [employer]  Tobacco Use  . Smoking status: Never Smoker  . Smokeless tobacco: Never Used  Substance and Sexual Activity  . Alcohol use: Yes    Alcohol/week: 1.8 oz    Types: 3 Cans of beer per week  . Drug use: No  . Sexual activity: Yes    Birth control/protection: IUD  Other Topics Concern  . Not on file  Social History Narrative   Has 45 yr old and 45 year old   Works as Dance movement psychotherapist at [employer]   Separated   She completed 2 years college   Enjoys entertaining friends    Past Surgical History:  Procedure Laterality Date  . BACK SURGERY    . BREAST SURGERY  2000   Left breast biopsy, benign  . TUBAL LIGATION      Family History  Problem Relation Age of Onset  . Arthritis Mother   . Osteoporosis Mother   . Diabetes Mother   . Diabetes Maternal Grandmother   . Diabetes Son   . Diabetes Maternal Aunt   . Diabetes Maternal Uncle     No Known Allergies  Current Outpatient Medications on File Prior to Visit  Medication Sig Dispense Refill  . azithromycin (ZITHROMAX) 250 MG tablet Take 2 tablets by mouth on day 1, followed by  1 tablet by mouth daily for 4 days. 6 tablet 0  . benzonatate (TESSALON) 100 MG capsule Take 1 capsule (100 mg total) by mouth 3 (three) times daily as needed for cough. 21 capsule 0  . clotrimazole-betamethasone (LOTRISONE) cream Apply 1 application topically 2 (two) times daily. 30 g 0  . fluticasone (FLONASE) 50 MCG/ACT nasal spray Place 2 sprays into both nostrils daily. 16 g 1  . levocetirizine (XYZAL) 5 MG tablet Take 1 tablet (5 mg total) by mouth every evening. 30 tablet 0  . losartan-hydrochlorothiazide (HYZAAR) 100-25 MG tablet Take 1 tablet by mouth daily. 30 tablet 11  . lubiprostone (AMITIZA) 24 MCG capsule Take 1 capsule (24 mcg  total) by mouth 2 (two) times daily with a meal. 30 capsule 3  . meloxicam (MOBIC) 15 MG tablet Take 1 tablet (15 mg total) by mouth daily. 30 tablet 0  . nitroGLYCERIN (NITRODUR - DOSED IN MG/24 HR) 0.2 mg/hr patch Apply 1/4th patch to affected elbow, change daily 30 patch 1  . Vitamin D, Ergocalciferol, (DRISDOL) 50000 units CAPS capsule TAKE 1 CAPSULE (50,000 UNITS TOTAL) BY MOUTH EVERY 7 DAYS FOR 12 WEEKS. 12 capsule 0   No current facility-administered medications on file prior to visit.     BP (!) 168/102   Pulse 66   Temp 97.9 F (36.6 C) (Oral)   Resp 16   Wt 160 lb 6.4 oz (72.8 kg)   SpO2 100%   BMI 27.53 kg/m       Objective:   Physical Exam  General Mental Status- Alert. General Appearance- Not in acute distress.   Skin General: Color- Normal Color. Moisture- Normal Moisture.  Neck Carotid Arteries- Normal color. Moisture- Normal Moisture. No carotid bruits. No JVD.  Chest and Lung Exam Auscultation: Breath Sounds:-Normal.  Cardiovascular Auscultation:Rythm- Regular. Murmurs & Other Heart Sounds:Auscultation of the heart reveals- No Murmurs.  Abdomen Inspection:-Inspeection Normal. Palpation/Percussion:Note:No mass. Palpation and Percussion of the abdomen reveal- Non Tender, Non Distended + BS, no rebound or guarding.    Neurologic Cranial Nerve exam:- CN III-XII intact(No nystagmus), symmetric smile. .Finger to Nose:- Normal/Intact Strength:- 5/5 equal and symmetric strength both upper and lower extremities.      Assessment & Plan:  Your blood pressure is mildly elevated compared to previous visit blood pressure reading.  I want you to continue the losartan 100 mg tab daily and I am adding amlodipine 10 mg to your regimen.  You report that you are urinating excessively since the onset of the diuretic.  If so I am reducing the dosage of the HCTZ.  I want you to check your blood pressure at home 3 times a week and my chart me in 2 weeks with your blood  pressure readings.  Then I would let you know if office visit is needed.  For history of high cholesterol we will repeat lipid panel today.  Also labs to include CMP and A1c.  Regarding the above-mentioned frequency of urination will make sure A1c not increased.(Aware of your family history and one time he had minimal sugar elevation on prior lab.)  Follow-up in 3 months or as needed.(Possibly sooner if labs abnormal or if your blood pressure is not coming down to less than 140/90)  Debra Ayers, Percell Miller, PA-C

## 2017-03-27 NOTE — Patient Instructions (Signed)
Your blood pressure is mildly elevated compared to previous visit blood pressure reading.  I want you to continue the losartan 100 mg tab daily and I am adding amlodipine 10 mg to your regimen.  You report that you are urinating excessively since the onset of the diuretic.  If so I am reducing the dosage of the HCTZ.  I want you to check your blood pressure at home 3 times a week and my chart me in 2 weeks with your blood pressure readings.  Then I would let you know if office visit is needed.  For history of high cholesterol we will repeat lipid panel today.  Also labs to include CMP and A1c.  Regarding the above-mentioned frequency of urination will make sure A1c not increased.(Aware of your family history and one time he had minimal sugar elevation on prior lab.)  Follow-up in 3 months or as needed.(Possibly sooner if labs abnormal or if your blood pressure is not coming down to less than 140/90)

## 2017-04-25 ENCOUNTER — Ambulatory Visit: Payer: BLUE CROSS/BLUE SHIELD | Admitting: Medical

## 2017-04-25 DIAGNOSIS — Z0289 Encounter for other administrative examinations: Secondary | ICD-10-CM

## 2017-07-09 ENCOUNTER — Ambulatory Visit (INDEPENDENT_AMBULATORY_CARE_PROVIDER_SITE_OTHER): Payer: BLUE CROSS/BLUE SHIELD | Admitting: Medical

## 2017-07-09 ENCOUNTER — Encounter: Payer: Self-pay | Admitting: Medical

## 2017-07-09 VITALS — BP 170/90 | HR 81 | Resp 16 | Ht 64.0 in | Wt 163.0 lb

## 2017-07-09 DIAGNOSIS — J329 Chronic sinusitis, unspecified: Secondary | ICD-10-CM | POA: Diagnosis not present

## 2017-07-09 DIAGNOSIS — R0789 Other chest pain: Secondary | ICD-10-CM

## 2017-07-09 DIAGNOSIS — J309 Allergic rhinitis, unspecified: Secondary | ICD-10-CM

## 2017-07-09 DIAGNOSIS — I10 Essential (primary) hypertension: Secondary | ICD-10-CM | POA: Diagnosis not present

## 2017-07-09 LAB — COMPREHENSIVE METABOLIC PANEL
ALT: 18 U/L (ref 0–35)
AST: 14 U/L (ref 0–37)
Albumin: 4.4 g/dL (ref 3.5–5.2)
Alkaline Phosphatase: 63 U/L (ref 39–117)
BILIRUBIN TOTAL: 0.7 mg/dL (ref 0.2–1.2)
BUN: 12 mg/dL (ref 6–23)
CO2: 26 meq/L (ref 19–32)
CREATININE: 0.78 mg/dL (ref 0.40–1.20)
Calcium: 9.4 mg/dL (ref 8.4–10.5)
Chloride: 103 mEq/L (ref 96–112)
GFR: 84.59 mL/min (ref >60.00)
GLUCOSE: 102 mg/dL — AB (ref 70–99)
Potassium: 3.6 mEq/L (ref 3.5–5.1)
Sodium: 137 mEq/L (ref 135–145)
Total Protein: 7.3 g/dL (ref 6.0–8.3)

## 2017-07-09 LAB — LIPID PANEL
CHOL/HDL RATIO: 4
Cholesterol: 160 mg/dL (ref 0–200)
HDL: 45 mg/dL (ref >39.00)
LDL Cholesterol: 99 mg/dL (ref 0–99)
NONHDL: 114.93
Triglycerides: 80 mg/dL (ref 0.0–149.0)
VLDL: 16 mg/dL (ref 0.0–40.0)

## 2017-07-09 MED ORDER — LEVOCETIRIZINE DIHYDROCHLORIDE 5 MG PO TABS: 5.0000 mg | ORAL_TABLET | Freq: Every evening | ORAL | 0 refills | Status: DC

## 2017-07-09 MED ORDER — FLUTICASONE PROPIONATE 50 MCG/ACT NA SUSP: 2.0000 | Freq: Every day | NASAL | 1 refills | Status: DC

## 2017-07-09 MED ORDER — METOPROLOL SUCCINATE ER 25 MG PO TB24: 25.0000 mg | ORAL_TABLET | Freq: Every day | ORAL | 0 refills | Status: DC

## 2017-07-09 MED ORDER — AMOXICILLIN-POT CLAVULANATE 875-125 MG PO TABS: 1.0000 | ORAL_TABLET | Freq: Two times a day (BID) | ORAL | 0 refills | Status: DC

## 2017-07-09 NOTE — Patient Instructions (Addendum)
You do have allergic rhinitis symptoms recently with probable sinus infection as well. Will rx xyzal antihistamine and flonase nasal spray. Also will rx antibiotic augmentin antibiotic as well.  For bp please stop you otc decongestant product and keep checking bp daily. If bp does not drop to less than 140/90 then need to add other bp medication. I am printing metoprolol 25 mg tab.  Your chest pain I think the other day may have been costochondritis. We have baseline ekg today that showd NSR. If you have any recurrent constant chest pain then be seen here, UC or ED. If severe then ED would be recommended.  Follow up 7-10 days or as needed

## 2017-07-09 NOTE — Progress Notes (Signed)
Subjective:    Patient ID: Unknown Debra Ayers, female    DOB: 10-21-1970, 47 y.o.   MRN: [id number]  HPI  Pt in for recent nasal congestion since last Wednesday. On and off hoarse voice, mild st, nasal congested and frontal sinus pain.  One week ago transient one day body aches then subsided. More upper thighs. No calf or popliteal pain.  Pt bp is also high. She has been taking ibuprofen with phenylephrine.  See ROS below about atypical chest pain. Mild ldl increase in past. Pt does not smoke. Occasional rare sob when ings [place] and sometimes going up stairs. No popliteal pain.   LMP-mirena.     Review of Systems  Constitutional: Negative for chills and fatigue.  HENT: Positive for congestion, sinus pressure and trouble swallowing.   Respiratory: Negative for chest tightness, shortness of breath and stridor.   Cardiovascular: Negative for chest pain and palpitations.       3 minutes of chest pain last week. No assciated signs or symptoms. Was more rt side. Points to costochondral junction.  Gastrointestinal: Negative for abdominal distention, abdominal pain and constipation.  Genitourinary: Negative for difficulty urinating and dysuria.  Musculoskeletal: Negative for back pain.  Skin: Negative for rash.  Neurological: Negative for facial asymmetry, speech difficulty, weakness, light-headedness and headaches.  Psychiatric/Behavioral: Negative for agitation, confusion, decreased concentration, sleep disturbance and suicidal ideas.    Past Medical History:  Diagnosis Date  . Anemia   . Heart murmur   . Hypertension   . Uterine fibroid      Social History   Socioeconomic History  . Marital status: Legally Separated    Spouse name: Not on file  . Number of children: 2  . Years of education: Not on file  . Highest education level: Not on file  Social Needs  . Financial resource strain: Not on file  . Food insecurity - worry: Not on file  . Food insecurity - inability:  Not on file  . Transportation needs - medical: Not on file  . Transportation needs - non-medical: Not on file  Occupational History    Employer: [employer]  Tobacco Use  . Smoking status: Never Smoker  . Smokeless tobacco: Never Used  Substance and Sexual Activity  . Alcohol use: Yes    Alcohol/week: 1.8 oz    Types: 3 Cans of beer per week  . Drug use: No  . Sexual activity: Yes    Birth control/protection: IUD  Other Topics Concern  . 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 BACK SURGERY    . BREAST SURGERY  2000   Left breast biopsy, benign  . TUBAL LIGATION      Family History  Problem Relation Age of Onset  . Arthritis Mother   . Osteoporosis Mother   . Diabetes Mother   . Diabetes Maternal Grandmother   . Diabetes Son   . Diabetes Maternal Aunt   . Diabetes Maternal Uncle     No Known Allergies  Current Outpatient Medications on File Prior to Visit  Medication Sig Dispense Refill  . losartan-hydrochlorothiazide (HYZAAR) 100-25 MG tablet Take 1 tablet by mouth daily. 30 tablet 11  . amLODipine (NORVASC) 10 MG tablet Take  1 tablet (10 mg total) by mouth daily. 30 tablet 3  . hydrochlorothiazide (MICROZIDE) 12.5 MG capsule Take 1 capsule (12.5 mg total) by mouth daily. 30 capsule 3   No current facility-administered medications on file prior to visit.     BP (!) 182/117 (BP Location: Right Arm, Patient Position: Sitting, Cuff Size: Large)   Pulse 81   Resp 16   Ht 5\' 4"  (1.626 m)   Wt 163 lb (73.9 kg)   SpO2 100%   BMI 27.98 kg/m       Objective:   Physical Exam  General Mental Status- Alert. General Appearance- Not in acute distress.   Skin General: Color- Normal Color. Moisture- Normal Moisture.  Neck Carotid Arteries- Normal color.  Moisture- Normal Moisture. No carotid bruits. No JVD.  No tracheal deviation.  Chest and Lung Exam Auscultation: Breath Sounds:-Normal.  Cardiovascular Auscultation:Rythm- Regular. Murmurs & Other Heart Sounds:Auscultation of the heart reveals- No Murmurs.  Abdomen Inspection:-Inspeection Normal. Palpation/Percussion:Note:No mass. Palpation and Percussion of the abdomen reveal- Non Tender, Non Distended + BS, no rebound or guarding.    Neurologic Cranial Nerve exam:- CN III-XII intact(No nystagmus), symmetric smile. Drift Test:- No drift. Romberg Exam:- Negative.  Heal to Toe Gait exam:-Normal. Finger to Nose:- Normal/Intact Strength:- 5/5 equal and symmetric strength both upper and lower extremities.  Lower ext- calfs symmetric, no popliteal pain.    HEENT Head- Normal. Ear Auditory Canal - Left- Normal. Right - Normal.Tympanic Membrane- Left- Normal. Right- Normal. Eye Sclera/Conjunctiva- Left- Normal. Right- Normal. Nose & Sinuses Nasal Mucosa- Left-  Boggy and Congested. Right-  Boggy and  Congested.Bilateral maxillary but no  frontal sinus pressure. Mouth & Throat Lips: Upper Lip- Normal: no dryness, cracking, pallor, cyanosis, or vesicular eruption. Lower Lip-Normal: no dryness, cracking, pallor, cyanosis or vesicular eruption. Buccal Mucosa- Bilateral- No Aphthous ulcers. Oropharynx- No Discharge or Erythema. Tonsils: Characteristics- Bilateral- No Erythema or Congestion. Size/Enlargement- Bilateral- No enlargement. Discharge- bilateral-None.   Anterior thorax-no pain presently on palpation of thorax wall.  However she does point  Out that area of pain was the region of the right costochondral junction     Assessment & Plan:  You do have allergic rhinitis symptoms recently with probable sinus infection as well. Will rx xyzal antihistamine and flonase nasal spray. Also will rx antibiotic augmentin antibiotic as well.  For bp please stop you otc decongestant  product and keep checking bp daily. If bp does not drop to less than 140/90 then need to add other bp medication. I am printing metoprolol 25 mg tab.  Your chest pain I think the other day may have been costochondritis. We have baseline ekg today that showd NSR. If you have any recurrent constant chest pain then be seen here, UC or ED. If severe then ED would be recommended.  Follow up 7-10 days or as needed  [employer], [employer]

## 2017-09-27 ENCOUNTER — Encounter: Payer: Self-pay | Admitting: Medical

## 2017-09-27 ENCOUNTER — Ambulatory Visit (INDEPENDENT_AMBULATORY_CARE_PROVIDER_SITE_OTHER): Payer: BLUE CROSS/BLUE SHIELD | Admitting: Medical

## 2017-09-27 VITALS — BP 165/95 | HR 86 | Temp 99.1°F | Resp 16 | Ht 64.0 in | Wt 167.4 lb

## 2017-09-27 DIAGNOSIS — J0100 Acute maxillary sinusitis, unspecified: Secondary | ICD-10-CM | POA: Diagnosis not present

## 2017-09-27 DIAGNOSIS — J029 Acute pharyngitis, unspecified: Secondary | ICD-10-CM | POA: Diagnosis not present

## 2017-09-27 DIAGNOSIS — R05 Cough: Secondary | ICD-10-CM

## 2017-09-27 DIAGNOSIS — R059 Cough, unspecified: Secondary | ICD-10-CM

## 2017-09-27 DIAGNOSIS — R0981 Nasal congestion: Secondary | ICD-10-CM | POA: Diagnosis not present

## 2017-09-27 MED ORDER — AZITHROMYCIN 250 MG PO TABS: ORAL_TABLET | ORAL | 0 refills | Status: DC

## 2017-09-27 MED ORDER — BENZONATATE 100 MG PO CAPS: 100.0000 mg | ORAL_CAPSULE | Freq: Three times a day (TID) | ORAL | 0 refills | Status: DC | PRN

## 2017-09-27 NOTE — Patient Instructions (Signed)
You appear to have bronchitis and sinusitis(possible allergy component). Rest hydrate and tylenol for fever. I am prescribing cough medicine benzonatate, and a azithromycin antibiotic. For your nasal congestion please use your Flonase.  Please stop using Sudafed.  This appears to have made your blood pressure increase.  Continue blood pressure medication.  You should gradually get better. If not then notify us and would recommend a chest xray.  You do have some sore throat.  By exam throat does not look very suspicious for infection.  But a azithromycin does have strep throat coverage.  Follow up in 7-10 days or as needed

## 2017-09-27 NOTE — Progress Notes (Signed)
Subjective:    Patient ID: Debra Ayers, female    DOB: 05/04/1970, 47 y.o.   MRN: [id number]  HPI  Pt in with 3 days of dry cough, nasal congestion, sinus pressure and pnd. When blows nose some clear mucus. ST at  Night.  She feels some congestion in her chest.  No wheezing.   Her husband just got out hospital. He had pneumonia for 3 days.  Pt took sudafed yesterday and today.     Review of Systems  Constitutional: Positive for diaphoresis and fatigue. Negative for chills and fever.       Some sweating but before she got sick.  HENT: Positive for congestion, postnasal drip, sinus pressure and sore throat. Negative for ear pain and sinus pain.   Respiratory: Positive for cough. Negative for chest tightness, shortness of breath and wheezing.   Cardiovascular: Negative for chest pain and palpitations.  Gastrointestinal: Negative for abdominal pain.  Genitourinary: Negative for dyspareunia and dysuria.  Musculoskeletal: Negative for back pain.  Skin: Negative for rash.  Neurological: Negative for dizziness, syncope, weakness, light-headedness and numbness.  Hematological: Negative for adenopathy. Does not bruise/bleed easily.  Psychiatric/Behavioral: Negative for behavioral problems, confusion and sleep disturbance. The patient is not nervous/anxious.     Past Medical History:  Diagnosis Date  . Anemia   . Heart murmur   . Hypertension   . Uterine fibroid      Social History   Socioeconomic History  . Marital status: Legally Separated    Spouse name: Not on file  . Number of children: 2  . Years of education: Not on file  . Highest education level: Not on file  Occupational History    Employer: [employer]  Social Needs  . Financial resource strain: Not on file  . Food insecurity:    Worry: Not on file    Inability: Not on file  . Transportation needs:    Medical: Not on file    Non-medical: Not on file  Tobacco Use  . Smoking status: Never Smoker  .  Smokeless tobacco: Never Used  Substance and Sexual Activity  . Alcohol use: Yes    Alcohol/week: 1.8 oz    Types: 3 Cans of beer per week  . Drug use: No  . Sexual activity: Yes    Birth control/protection: IUD  Lifestyle  . Physical activity:    Days per week: Not on file    Minutes per session: Not on file  . Stress: Not on file  Relationships  . Social connections:    Talks on phone: Not on file    Gets together: Not on file    Attends religious service: Not on file    Active member of club or organization: Not on file    Attends meetings of clubs or organizations: Not on file    Relationship status: Not on file  . Intimate partner violence:    Fear of current or ex partner: Not on file    Emotionally abused: Not on file    Physically abused: Not on file    Forced sexual activity: Not on file  Other Topics Concern  . 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 BACK SURGERY    .  BREAST SURGERY  2000   Left breast biopsy, benign  . TUBAL LIGATION      Family History  Problem Relation Age of Onset  . Arthritis Mother   . Osteoporosis Mother   . Diabetes Mother   . Diabetes Maternal Grandmother   . Diabetes Son   . Diabetes Maternal Aunt   . Diabetes Maternal Uncle     No Known Allergies  Current Outpatient Medications on File Prior to Visit  Medication Sig Dispense Refill  . amLODipine (NORVASC) 10 MG tablet Take 1 tablet (10 mg total) by mouth daily. 30 tablet 3  . amoxicillin-clavulanate (AUGMENTIN) 875-125 MG tablet Take 1 tablet by mouth 2 (two) times daily. 20 tablet 0  . fluticasone (FLONASE) 50 MCG/ACT nasal spray Place 2 sprays into both nostrils daily. 16 g 1  . hydrochlorothiazide (MICROZIDE) 12.5 MG capsule Take 1 capsule (12.5 mg total) by mouth daily.  30 capsule 3  . levocetirizine (XYZAL) 5 MG tablet Take 1 tablet (5 mg total) by mouth every evening. 30 tablet 0  . losartan-hydrochlorothiazide (HYZAAR) 100-25 MG tablet Take 1 tablet by mouth daily. 30 tablet 11  . metoprolol succinate (TOPROL-XL) 25 MG 24 hr tablet Take 1 tablet (25 mg total) by mouth daily. 30 tablet 0   No current facility-administered medications on file prior to visit.     BP (!) 172/108   Pulse 86   Temp 99.1 F (37.3 C) (Oral)   Resp 16   Ht 5\' 4"  (1.626 m)   Wt 167 lb 6.4 oz (75.9 kg)   SpO2 99%   BMI 28.73 kg/m       Objective:   Physical Exam  General  Mental Status - Alert. General Appearance - Well groomed. Not in acute distress.  Skin Rashes- No Rashes.  HEENT Head- Normal. Ear Auditory Canal - Left- Normal. Right - Normal.Tympanic Membrane- Left- Normal. Right- Normal. Eye Sclera/Conjunctiva- Left- Normal. Right- Normal. Nose & Sinuses Nasal Mucosa- Left-  Boggy and Congested. Right-  Boggy and  Congested.Bilateral maxillary and frontal sinus pressure. Mouth & Throat Lips: Upper Lip- Normal: no dryness, cracking, pallor, cyanosis, or vesicular eruption. Lower Lip-Normal: no dryness, cracking, pallor, cyanosis or vesicular eruption. Buccal Mucosa- Bilateral- No Aphthous ulcers. Oropharynx- No Discharge or Erythema. Tonsils: Characteristics- Bilateral- faint  Erythema Size/Enlargement- Bilateral- No enlargement. Discharge- bilateral-None.  Neck Neck- Supple. No Masses.   Chest and Lung Exam Auscultation: Breath Sounds:-Clear even and unlabored.  Cardiovascular Auscultation:Rythm- Regular, rate and rhythm. Murmurs & Other Heart Sounds:Ausculatation of the heart reveal- No Murmurs.  Lymphatic Head & Neck General Head & Neck Lymphatics: Bilateral: Description- No Localized lymphadenopathy.       Assessment & Plan:   You appear to have bronchitis and sinusitis(possible allergy component). Rest hydrate and tylenol for fever. I  am prescribing cough medicine benzonatate, and a azithromycin antibiotic. For your nasal congestion please use your Flonase.  Please stop using Sudafed.  This appears to have made your blood pressure increase.  Continue blood pressure medication.  You should gradually get better. If not then notify us and would recommend a chest xray.  You do have some sore throat.  By exam throat does not look very suspicious for infection.  But a azithromycin does have strep throat coverage.  Follow up in 7-10 days or as needed  [employer], [employer]

## 2017-10-08 ENCOUNTER — Ambulatory Visit: Payer: BLUE CROSS/BLUE SHIELD | Admitting: Medical

## 2017-10-08 DIAGNOSIS — Z0289 Encounter for other administrative examinations: Secondary | ICD-10-CM

## 2017-10-10 ENCOUNTER — Encounter: Payer: Self-pay | Admitting: Medical

## 2017-10-15 ENCOUNTER — Encounter: Payer: Self-pay | Admitting: Medical

## 2018-02-02 DIAGNOSIS — R110 Nausea: Secondary | ICD-10-CM | POA: Diagnosis not present

## 2018-02-02 DIAGNOSIS — I160 Hypertensive urgency: Secondary | ICD-10-CM | POA: Diagnosis not present

## 2018-02-02 DIAGNOSIS — R0602 Shortness of breath: Secondary | ICD-10-CM | POA: Diagnosis not present

## 2018-02-02 DIAGNOSIS — G4489 Other headache syndrome: Secondary | ICD-10-CM | POA: Diagnosis not present

## 2018-03-15 ENCOUNTER — Encounter: Payer: Self-pay | Admitting: Medical

## 2018-03-15 ENCOUNTER — Ambulatory Visit (INDEPENDENT_AMBULATORY_CARE_PROVIDER_SITE_OTHER): Payer: BLUE CROSS/BLUE SHIELD | Admitting: Medical

## 2018-03-15 ENCOUNTER — Ambulatory Visit (HOSPITAL_BASED_OUTPATIENT_CLINIC_OR_DEPARTMENT_OTHER)
Admission: RE | Admit: 2018-03-15 | Discharge: 2018-03-15 | Disposition: A | Discharge status: Home / Self Care | Payer: BLUE CROSS/BLUE SHIELD | Source: Ambulatory Visit | Attending: Medical | Admitting: Medical

## 2018-03-15 ENCOUNTER — Encounter (HOSPITAL_BASED_OUTPATIENT_CLINIC_OR_DEPARTMENT_OTHER): Payer: Self-pay

## 2018-03-15 VITALS — BP 130/80 | HR 67 | Temp 98.9°F | Resp 16 | Ht 64.0 in | Wt 162.0 lb

## 2018-03-15 DIAGNOSIS — R1900 Intra-abdominal and pelvic swelling, mass and lump, unspecified site: Secondary | ICD-10-CM

## 2018-03-15 DIAGNOSIS — I10 Essential (primary) hypertension: Secondary | ICD-10-CM

## 2018-03-15 DIAGNOSIS — M25561 Pain in right knee: Secondary | ICD-10-CM | POA: Insufficient documentation

## 2018-03-15 DIAGNOSIS — D259 Leiomyoma of uterus, unspecified: Secondary | ICD-10-CM | POA: Insufficient documentation

## 2018-03-15 DIAGNOSIS — M25562 Pain in left knee: Secondary | ICD-10-CM

## 2018-03-15 LAB — C-REACTIVE PROTEIN: CRP: 0.3 mg/dL — ABNORMAL LOW (ref 0.5–20.0)

## 2018-03-15 LAB — URIC ACID: Uric Acid, Serum: 4.7 mg/dL (ref 2.4–7.0)

## 2018-03-15 LAB — SEDIMENTATION RATE: SED RATE: 2 mm/h (ref 0–20)

## 2018-03-15 NOTE — Progress Notes (Signed)
Subjective:    Patient ID: Debra Ayers, female    DOB: 03/03/1971, 47 y.o.   MRN: [id number]  HPI  Pt in with some bilateral knee pain. Pt had left knee pain worse in past for about 3 weeks and this morning rt knee was hurting. No trauma or fall. Pain on flexion and extension.   No medication for pain presently.   Pt has htn and bp is well controlled.  Pain left side level presently level 2/10. But after work 8/10 on left side.  Rt side max level pain 2/10.   Review of Systems  Constitutional: Negative for chills, fatigue and fever.  Respiratory: Negative for cough, chest tightness, shortness of breath and wheezing.   Cardiovascular: Negative for chest pain and palpitations.  Gastrointestinal: Negative for abdominal pain.       Left lower quadrant lump. Felt last night. Felt it just lying down. Just felt slight discmfort then. But went away quckly.  Musculoskeletal: Negative for back pain.  Skin: Negative for rash.  Neurological: Negative for dizziness, speech difficulty, weakness, numbness and headaches.  Hematological: Negative for adenopathy. Does not bruise/bleed easily.  Psychiatric/Behavioral: Negative for behavioral problems and confusion.    Past Medical History:  Diagnosis Date  . Anemia   . Heart murmur   . Hypertension   . Uterine fibroid      Social History   Socioeconomic History  . Marital status: Legally Separated    Spouse name: Not on file  . Number of children: 2  . Years of education: Not on file  . Highest education level: Not on file  Occupational History    Employer: [employer]  Social Needs  . Financial resource strain: Not on file  . Food insecurity:    Worry: Not on file    Inability: Not on file  . Transportation needs:    Medical: Not on file    Non-medical: Not on file  Tobacco Use  . Smoking status: Never Smoker  . Smokeless tobacco: Never Used  Substance and Sexual Activity  . Alcohol use: Yes    Alcohol/week: 3.0  standard drinks    Types: 3 Cans of beer per week  . Drug use: No  . Sexual activity: Yes    Birth control/protection: IUD  Lifestyle  . Physical activity:    Days per week: Not on file    Minutes per session: Not on file  . Stress: Not on file  Relationships  . Social connections:    Talks on phone: Not on file    Gets together: Not on file    Attends religious service: Not on file    Active member of club or organization: Not on file    Attends meetings of clubs or organizations: Not on file    Relationship status: Not on file  . Intimate partner violence:    Fear of current or ex partner: Not on file    Emotionally abused: Not on file    Physically abused: Not on file    Forced sexual activity: Not on file  Other Topics Concern  . 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 BACK SURGERY    . BREAST SURGERY  2000  Left breast biopsy, benign  . TUBAL LIGATION      Family History  Problem Relation Age of Onset  . Arthritis Mother   . Osteoporosis Mother   . Diabetes Mother   . Diabetes Maternal Grandmother   . Diabetes Son   . Diabetes Maternal Aunt   . Diabetes Maternal Uncle     No Known Allergies  Current Outpatient Medications on File Prior to Visit  Medication Sig Dispense Refill  . losartan-hydrochlorothiazide (HYZAAR) 100-25 MG tablet Take 1 tablet by mouth daily. 30 tablet 11   No current facility-administered medications on file prior to visit.     BP (!) 128/91   Pulse 67   Temp 98.9 F (37.2 C) (Oral)   Resp 16   Ht 5\' 4"  (1.626 m)   Wt 162 lb (73.5 kg)   SpO2 100%   BMI 27.81 kg/m       Objective:   Physical Exam  General Mental Status- Alert. General Appearance- Not in acute distress.   Skin General: Color- Normal Color. Moisture-  Normal Moisture.  Neck Carotid Arteries- Normal color. Moisture- Normal Moisture. No carotid bruits. No JVD.  Chest and Lung Exam Auscultation: Breath Sounds:-Normal.  Cardiovascular Auscultation:Rythm- Regular. Murmurs & Other Heart Sounds:Auscultation of the heart reveals- No Murmurs.  Abdomen Inspection:-Inspeection Normal. Palpation/Percussion:Note:No mass. Palpation and Percussion of the abdomen reveal- Non Tender, Non Distended + BS, no rebound or guarding.   Neurologic Cranial Nerve exam:- CN III-XII intact(No nystagmus), symmetric smile. Strength:- 5/5 equal and symmetric strength both upper and lower extremities.  Abdomen- soft, nt, nd, +bs. Left mid suprapubic area feels like small lipoma about 2 cm size of maybe hernia tender to palpation.   Assessment & Plan:  For bilateral knee pains, I placed xray order to assess joint spaces and order inflammatory lab studies. Recommend that you try combination of tylenol and low dose ibuprofen as explained. Will follow studies and let you know if referral to sports med or ortho needed.  BP is ok today. Advised use low dose nsaid due to effect on bp.  I did place pelvis US today. Please get that scheduled today. Will update you on those results.  Follow up date to be determined after study review.  I did place pelvis US today. Please get that scheduled today. Will update you on those results.     Mackie Pai, PA-C

## 2018-03-15 NOTE — Patient Instructions (Addendum)
For bilateral knee pains, I placed xray order to assess joint spaces and order inflammatory lab studies. Recommend that you try combination of tylenol and low dose ibuprofen as explained. Will follow studies and let you know if referral to sports med or ortho needed.  BP is ok today. Advised use low dose nsaid due to effect on bp.  Follow up date to be determined after study review.  I did place pelvis US today. Please get that scheduled today. Will update you on those results.

## 2018-03-17 ENCOUNTER — Telehealth: Payer: Self-pay | Admitting: Medical

## 2018-03-17 DIAGNOSIS — D259 Leiomyoma of uterus, unspecified: Secondary | ICD-10-CM

## 2018-03-17 NOTE — Telephone Encounter (Signed)
Referral to gyn placed.

## 2018-03-18 ENCOUNTER — Telehealth: Payer: Self-pay | Admitting: Obstetrics and Gynecology

## 2018-03-18 LAB — RHEUMATOID FACTOR: Rhuematoid fact SerPl-aCnc: <14 IU/mL (ref <14)

## 2018-03-18 LAB — ANA: Anti Nuclear Antibody(ANA): NEGATIVE

## 2018-03-18 NOTE — Telephone Encounter (Signed)
Called and left a message for patient to call back to schedule a new patient doctor referral appointment with our office to see any doctor for: Uterine leiomyoma.

## 2018-03-20 NOTE — Telephone Encounter (Signed)
Called and left a message for patient to call back to schedule a new patient doctor referral appointment with our office to see any doctor for: Uterine leiomyoma.

## 2018-03-25 NOTE — Telephone Encounter (Signed)
Called and left a message for patient to call back to schedule a new patient doctor referral appointment with our officeto see any doctor HOO:[id number] leiomyoma.

## 2018-03-27 NOTE — Telephone Encounter (Signed)
I'm routing this referral back to the referring office.

## 2018-03-28 ENCOUNTER — Telehealth: Payer: Self-pay | Admitting: Medical

## 2018-03-28 NOTE — Telephone Encounter (Signed)
Will you call patient and notify her that gynecologist has been trying to contact her for appointment concerning her fibroids. They called her numerous times.Will you get number of gyn from [facility]. And give to patient so she can call them back to schedule. I am taking out my referral now so she can self refer.

## 2018-03-29 NOTE — Telephone Encounter (Signed)
Patient called back, she already made an appointment to see a GYN at [facility].

## 2018-03-29 NOTE — Telephone Encounter (Signed)
Lm (in [place] ) for patient to call me back at my number so I can give her the information for [facility] [phone]

## 2018-04-18 DIAGNOSIS — Z124 Encounter for screening for malignant neoplasm of cervix: Secondary | ICD-10-CM | POA: Diagnosis not present

## 2018-04-18 DIAGNOSIS — Z01419 Encounter for gynecological examination (general) (routine) without abnormal findings: Secondary | ICD-10-CM | POA: Diagnosis not present

## 2018-04-18 DIAGNOSIS — Z6829 Body mass index (BMI) 29.0-29.9, adult: Secondary | ICD-10-CM | POA: Diagnosis not present

## 2018-05-02 DIAGNOSIS — R151 Fecal smearing: Secondary | ICD-10-CM | POA: Diagnosis not present

## 2018-05-02 DIAGNOSIS — Z1211 Encounter for screening for malignant neoplasm of colon: Secondary | ICD-10-CM | POA: Diagnosis not present

## 2018-05-02 DIAGNOSIS — K641 Second degree hemorrhoids: Secondary | ICD-10-CM | POA: Diagnosis not present

## 2018-05-02 DIAGNOSIS — R194 Change in bowel habit: Secondary | ICD-10-CM | POA: Diagnosis not present

## 2018-05-21 ENCOUNTER — Ambulatory Visit (INDEPENDENT_AMBULATORY_CARE_PROVIDER_SITE_OTHER): Payer: BLUE CROSS/BLUE SHIELD | Admitting: Nurse Practitioner

## 2018-05-21 ENCOUNTER — Encounter: Payer: Self-pay | Admitting: Nurse Practitioner

## 2018-05-21 VITALS — BP 156/106 | HR 78 | Temp 97.8°F | Ht 64.0 in | Wt 167.0 lb

## 2018-05-21 DIAGNOSIS — R6889 Other general symptoms and signs: Secondary | ICD-10-CM | POA: Diagnosis not present

## 2018-05-21 LAB — POC INFLUENZA A&B (BINAX/QUICKVUE)
INFLUENZA B, POC: NEGATIVE
Influenza A, POC: NEGATIVE

## 2018-05-21 MED ORDER — OSELTAMIVIR PHOSPHATE 75 MG PO CAPS: 75.0000 mg | ORAL_CAPSULE | Freq: Two times a day (BID) | ORAL | 0 refills | Status: DC

## 2018-05-21 MED ORDER — GUAIFENESIN ER 600 MG PO TB12: 600.0000 mg | ORAL_TABLET | Freq: Two times a day (BID) | ORAL | 0 refills | Status: DC | PRN

## 2018-05-21 NOTE — Progress Notes (Signed)
Subjective:  Patient ID: Debra Ayers, female    DOB: 08-21-1970  Age: 48 y.o. MRN: [id number]  CC: Nasal Congestion (pt c/o congestion,watery eys,headche,bodyache/ going on 1 day)  URI   This is a new problem. The current episode started today. The problem has been unchanged. The maximum temperature recorded prior to her arrival was 100.4 - 100.9 F. Associated symptoms include congestion, headaches, rhinorrhea, sinus pain, sneezing, a sore throat and swollen glands. Pertinent negatives include no coughing or wheezing. She has tried decongestant and antihistamine for the symptoms. The treatment provided no relief.    Reviewed past Medical, Social and Family history today.  Outpatient Medications Prior to Visit  Medication Sig Dispense Refill  . losartan-hydrochlorothiazide (HYZAAR) 100-25 MG tablet Take 1 tablet by mouth daily. 30 tablet 11   No facility-administered medications prior to visit.     ROS See HPI  Objective:  There were no vitals taken for this visit.  BP Readings from Last 3 Encounters:  03/15/18 130/80  09/27/17 (!) 165/95  07/09/17 (!) 170/90    Wt Readings from Last 3 Encounters:  03/15/18 162 lb (73.5 kg)  09/27/17 167 lb 6.4 oz (75.9 kg)  07/09/17 163 lb (73.9 kg)    Physical Exam Vitals signs reviewed.  HENT:     Head:     Jaw: No trismus.     Right Ear: Tympanic membrane, ear canal and external ear normal.     Left Ear: Tympanic membrane, ear canal and external ear normal.     Nose: Mucosal edema and rhinorrhea present.     Right Sinus: Maxillary sinus tenderness present. No frontal sinus tenderness.     Left Sinus: Maxillary sinus tenderness present. No frontal sinus tenderness.     Mouth/Throat:     Pharynx: Uvula midline. Posterior oropharyngeal erythema present. No oropharyngeal exudate.  Eyes:     General: No scleral icterus. Neck:     Musculoskeletal: Normal range of motion and neck supple.  Cardiovascular:     Rate and Rhythm:  Normal rate.     Heart sounds: Murmur present.  Pulmonary:     Effort: Pulmonary effort is normal.     Breath sounds: Normal breath sounds.  Lymphadenopathy:     Cervical: Cervical adenopathy present.  Neurological:     Mental Status: She is alert and oriented to person, place, and time.     Lab Results  Component Value Date   WBC 9.0 09/10/2015   HGB 14.2 09/10/2015   HCT 42.5 09/10/2015   PLT 249.0 09/10/2015   GLUCOSE 102 (H) 07/09/2017   CHOL 160 07/09/2017   TRIG 80.0 07/09/2017   HDL 45.00 07/09/2017   LDLCALC 99 07/09/2017   ALT 18 07/09/2017   AST 14 07/09/2017   NA 137 07/09/2017   K 3.6 07/09/2017   CL 103 07/09/2017   CREATININE 0.78 07/09/2017   BUN 12 07/09/2017   CO2 26 07/09/2017   TSH 1.41 09/10/2015   HGBA1C 5.6 03/27/2017    US Pelvis Limited  Result Date: 03/17/2018 CLINICAL DATA:  Palpable lump over the lower abdomen near the adnexal region. Lipoma versus hernia. EXAM: LIMITED ULTRASOUND OF PELVIS TECHNIQUE: Limited transabdominal ultrasound examination of the pelvis was performed. COMPARISON:  CT scan January 02, 2013 FINDINGS: No lipoma or hernia seen in the region of the palpable palpable lump. The patient appears to be feeling a fibroid in the right anterior fundus of the uterus measuring 3.2 x 2.4 x 3.4 cm.  Multiple fibroids are seen in the enlarged uterus measures which measures 13.4 x 10.6 x 11.9 cm. IMPRESSION: 1. The patient appears to be feeling a fibroid in the anterior right fundus of the uterus. The uterus contains multiple fibroids in the uterus which is overall enlarged. No lipoma or hernia seen. Electronically Signed   By: Dorise Bullion III M.D   On: 03/17/2018 11:04   Dg Knee 3 View Left  Result Date: 03/15/2018 CLINICAL DATA:  Bilateral knee pain for 3 weeks EXAM: LEFT KNEE - 3 VIEW RIGHT KNEE - 3 VIEW COMPARISON:  None. FINDINGS: Bones: No fracture or dislocation. Normal bone mineralization. No periostitis. Joints: Normal  alignment. No erosive changes. Minimal medial femorotibial compartment joint space narrowing with tiny marginal osteophytes bilaterally. Lateral femorotibial compartment and patellofemoral compartment joint spaces are relatively well maintained. No significant joint effusion. Soft tissue: No soft tissue abnormality. No radiopaque foreign body. No chondrocalcinosis. IMPRESSION: 1.  No acute osseous injury of the bilateral knees. 2. Minimal joint space narrowing of medial femorotibial compartment bilaterally. Electronically Signed   By: Kathreen Devoid   On: 03/15/2018 10:00   Dg Knee 3 Views Right  Result Date: 03/15/2018 CLINICAL DATA:  Bilateral knee pain for 3 weeks EXAM: LEFT KNEE - 3 VIEW RIGHT KNEE - 3 VIEW COMPARISON:  None. FINDINGS: Bones: No fracture or dislocation. Normal bone mineralization. No periostitis. Joints: Normal alignment. No erosive changes. Minimal medial femorotibial compartment joint space narrowing with tiny marginal osteophytes bilaterally. Lateral femorotibial compartment and patellofemoral compartment joint spaces are relatively well maintained. No significant joint effusion. Soft tissue: No soft tissue abnormality. No radiopaque foreign body. No chondrocalcinosis. IMPRESSION: 1.  No acute osseous injury of the bilateral knees. 2. Minimal joint space narrowing of medial femorotibial compartment bilaterally. Electronically Signed   By: Kathreen Devoid   On: 03/15/2018 10:00    Assessment & Plan:   Debra Ayers was seen today for nasal congestion.  Diagnoses and all orders for this visit:  Flu-like symptoms -     oseltamivir (TAMIFLU) 75 MG capsule; Take 1 capsule (75 mg total) by mouth 2 (two) times daily. -     guaiFENesin (MUCINEX) 600 MG 12 hr tablet; Take 1 tablet (600 mg total) by mouth 2 (two) times daily as needed for cough or to loosen phlegm. -     POC Influenza A&B(BINAX/QUICKVUE)   I am having Debra Ayers start on oseltamivir and guaiFENesin. I am also having  her maintain her losartan-hydrochlorothiazide.  Meds ordered this encounter  Medications  . oseltamivir (TAMIFLU) 75 MG capsule    Sig: Take 1 capsule (75 mg total) by mouth 2 (two) times daily.    Dispense:  10 capsule    Refill:  0    Order Specific Question:   Supervising Provider    Answer:   Clearance Coots E [[id number]]  . guaiFENesin (MUCINEX) 600 MG 12 hr tablet    Sig: Take 1 tablet (600 mg total) by mouth 2 (two) times daily as needed for cough or to loosen phlegm.    Dispense:  14 tablet    Refill:  0    Order Specific Question:   Supervising Provider    Answer:   Clearance Coots E [[id number]]    Problem List Items Addressed This Visit    None    Visit Diagnoses    Flu-like symptoms    -  Primary   Relevant Medications   oseltamivir (TAMIFLU) 75 MG capsule   guaiFENesin (  MUCINEX) 600 MG 12 hr tablet   Other Relevant Orders   POC Influenza A&B(BINAX/QUICKVUE)       Follow-up: No follow-ups on file.  Wilfred Lacy, NP

## 2018-05-21 NOTE — Patient Instructions (Addendum)
Please discuss murmur with pcp.  Upper Respiratory Infection, Adult An upper respiratory infection (URI) affects the nose, throat, and upper air passages. URIs are caused by germs (viruses). The most common type of URI is often called "the common cold." Medicines cannot cure URIs, but you can do things at home to relieve your symptoms. URIs usually get better within 7-10 days. Follow these instructions at home: Activity  Rest as needed.  If you have a fever, stay home from work or school until your fever is gone, or until your doctor says you may return to work or school. ? You should stay home until you cannot spread the infection anymore (you are not contagious). ? Your doctor may have you wear a face mask so you have less risk of spreading the infection. Relieving symptoms  Gargle with a salt-water mixture 3-4 times a day or as needed. To make a salt-water mixture, completely dissolve -1 tsp of salt in 1 cup of warm water.  Use a cool-mist humidifier to add moisture to the air. This can help you breathe more easily. Eating and drinking   Drink enough fluid to keep your pee (urine) pale yellow.  Eat soups and other clear broths. General instructions   Take over-the-counter and prescription medicines only as told by your doctor. These include cold medicines, fever reducers, and cough suppressants.  Do not use any products that contain nicotine or tobacco. These include cigarettes and e-cigarettes. If you need help quitting, ask your doctor.  Avoid being where people are smoking (avoid secondhand smoke).  Make sure you get regular shots and get the flu shot every year.  Keep all follow-up visits as told by your doctor. This is important. How to avoid spreading infection to others   Wash your hands often with soap and water. If you do not have soap and water, use hand sanitizer.  Avoid touching your mouth, face, eyes, or nose.  Cough or sneeze into a tissue or your sleeve  or elbow. Do not cough or sneeze into your hand or into the air. Contact a doctor if:  You are getting worse, not better.  You have any of these: ? A fever. ? Chills. ? Brown or red mucus in your nose. ? Yellow or brown fluid (discharge)coming from your nose. ? Pain in your face, especially when you bend forward. ? Swollen neck glands. ? Pain with swallowing. ? White areas in the back of your throat. Get help right away if:  You have shortness of breath that gets worse.  You have very bad or constant: ? Headache. ? Ear pain. ? Pain in your forehead, behind your eyes, and over your cheekbones (sinus pain). ? Chest pain.  You have long-lasting (chronic) lung disease along with any of these: ? Wheezing. ? Long-lasting cough. ? Coughing up blood. ? A change in your usual mucus.  You have a stiff neck.  You have changes in your: ? Vision. ? Hearing. ? Thinking. ? Mood. Summary  An upper respiratory infection (URI) is caused by a germ called a virus. The most common type of URI is often called "the common cold."  URIs usually get better within 7-10 days.  Take over-the-counter and prescription medicines only as told by your doctor. This information is not intended to replace advice given to you by your health care provider. Make sure you discuss any questions you have with your health care provider. Document Released: 09/27/2007 Document Revised: 12/01/2016 Document Reviewed: 12/01/2016 Elsevier Interactive  Patient Education  2019 [employer].

## 2018-05-22 ENCOUNTER — Encounter: Payer: Self-pay | Admitting: Nurse Practitioner

## 2018-08-28 ENCOUNTER — Ambulatory Visit (INDEPENDENT_AMBULATORY_CARE_PROVIDER_SITE_OTHER): Payer: BLUE CROSS/BLUE SHIELD | Admitting: Medical

## 2018-08-28 ENCOUNTER — Encounter: Payer: Self-pay | Admitting: Medical

## 2018-08-28 ENCOUNTER — Telehealth: Payer: Self-pay | Admitting: Medical

## 2018-08-28 VITALS — BP 198/100

## 2018-08-28 DIAGNOSIS — I10 Essential (primary) hypertension: Secondary | ICD-10-CM | POA: Diagnosis not present

## 2018-08-28 DIAGNOSIS — G8929 Other chronic pain: Secondary | ICD-10-CM

## 2018-08-28 DIAGNOSIS — J301 Allergic rhinitis due to pollen: Secondary | ICD-10-CM

## 2018-08-28 DIAGNOSIS — M25562 Pain in left knee: Secondary | ICD-10-CM

## 2018-08-28 NOTE — Progress Notes (Signed)
Subjective:    Patient ID: Debra Ayers, female    DOB: 1970-11-07, 48 y.o.   MRN: [id number]  HPI Virtual Visit via Video Note  I connected with Debra Ayers on 08/28/18 at  1:20 PM EDT by a video enabled telemedicine application and verified that I am speaking with the correct person using two identifiers.  Location: Patient: home Provider: home.   I discussed the limitations of evaluation and management by telemedicine and the availability of in person appointments. The patient expressed understanding and agreed to proceed.   History of Present Illness: Pt in states her knee pain went away and know has returned. Left knee hurt worse. Pt went to sport med. She declined nsaids.  Pt bp recently high. She wants to go back on enalapril. She states diuretics cause her to urinate too much.  Pt states allergies controlled with  flonase. It helps a lot and she wants refill.    Observations/Objective: General- no acute distress. Oriented. Lungs- unlabored. Neuro- gross motor intact. Knees-left knee pain on range of motion,   Assessment and Plan: For chronic knee pain will advise tylenol for mild pain and rx tramadol for more severe pain. Might in near future rx nsaid but need your bp to be better controlled.  For htn. Continue losartan 100 mg daily. Add amlodipine 10 mg daily. Part of your bp reading being high due to noncompliance today. If any cardiac or neurologic signs or symptoms then go to ED.  For allegies rx flonase refill.  Follow up 10 days virtual visit    Follow Up Instructions:    I discussed the assessment and treatment plan with the patient. The patient was provided an opportunity to ask questions and all were answered. The patient agreed with the plan and demonstrated an understanding of the instructions.   The patient was advised to call back or seek an in-person evaluation if the symptoms worsen or if the condition fails to improve as  anticipated.  25 minutes spent with pt. 50% of time spent with pt discussing plans for each condition. Particularly more time disussing blood pressure.    Mackie Pai, PA-C    Review of Systems  Constitutional: Negative for chills, diaphoresis and fatigue.  Respiratory: Negative for apnea, cough, choking and wheezing.   Cardiovascular: Negative for chest pain and palpitations.  Gastrointestinal: Negative for abdominal pain.  Musculoskeletal: Negative for back pain.       Knee pain- left knee pain worse than rt.  Skin: Negative for rash.  Neurological: Negative for dizziness, weakness, light-headedness, numbness and headaches.  Hematological: Negative for adenopathy. Does not bruise/bleed easily.  Psychiatric/Behavioral: Negative for behavioral problems and decreased concentration.   Past Medical History:  Diagnosis Date  . Anemia   . Heart murmur   . Hypertension    medication  . Uterine fibroid      Social History   Socioeconomic History  . Marital status: Legally Separated    Spouse name: Not on file  . Number of children: 2  . Years of education: Not on file  . Highest education level: Not on file  Occupational History    Employer: [employer]  Social Needs  . Financial resource strain: Not on file  . Food insecurity:    Worry: Not on file    Inability: Not on file  . Transportation needs:    Medical: Not on file    Non-medical: Not on file  Tobacco Use  . Smoking  status: Never Smoker  . Smokeless tobacco: Never Used  Substance and Sexual Activity  . Alcohol use: Yes    Alcohol/week: 3.0 standard drinks    Types: 3 Cans of beer per week  . Drug use: No  . Sexual activity: Yes    Birth control/protection: I.U.D.  Lifestyle  . Physical activity:    Days per week: Not on file    Minutes per session: Not on file  . Stress: Not on file  Relationships  . Social connections:    Talks on phone: Not on file    Gets together: Not on file    Attends  religious service: Not on file    Active member of club or organization: Not on file    Attends meetings of clubs or organizations: Not on file    Relationship status: Not on file  . Intimate partner violence:    Fear of current or ex partner: Not on file    Emotionally abused: Not on file    Physically abused: Not on file    Forced sexual activity: Not on file  Other Topics Concern  . Not on file  Social History Narrative   Has 47 yr old and 47 year old   Works as Dance movement psychotherapist at [employer]   Separated   She completed 2 years college   Enjoys entertaining friends    Past Surgical History:  Procedure Laterality Date  . BACK SURGERY  2004   Benign fatty tumor removed from mid upper back  . BREAST ENHANCEMENT SURGERY  2010  . BREAST IMPLANT REMOVAL  2013  . BREAST SURGERY  2014   Left breast biopsy, benign  . TUBAL LIGATION  2001  . UMBILICAL HERNIA REPAIR  2018    Family History  Problem Relation Age of Onset  . Arthritis Mother   . Osteoporosis Mother   . Diabetes Mother   . Diabetes Maternal Grandmother   . Diabetes Son   . Diabetes Maternal Aunt   . Diabetes Maternal Uncle     No Known Allergies  Current Outpatient Medications on File Prior to Visit  Medication Sig Dispense Refill  . guaiFENesin (MUCINEX) 600 MG 12 hr tablet Take 1 tablet (600 mg total) by mouth 2 (two) times daily as needed for cough or to loosen phlegm. 14 tablet 0  . losartan-hydrochlorothiazide (HYZAAR) 100-25 MG tablet Take 1 tablet by mouth daily. 30 tablet 11  . oseltamivir (TAMIFLU) 75 MG capsule Take 1 capsule (75 mg total) by mouth 2 (two) times daily. 10 capsule 0   No current facility-administered medications on file prior to visit.     BP (!) 198/100       Objective:   Physical Exam        Assessment & Plan:

## 2018-08-28 NOTE — Telephone Encounter (Signed)
Copied from [place] [id number]. Topic: Quick Communication - See Telephone Encounter >> Aug 28, 2018  4:54 PM Vernona Rieger wrote: CRM for notification. See Telephone encounter for: 08/28/18.  Patient states that Debra Ayers was suppose to send in Flonase, Tramadol and amlodipine to her pharmacy. The pharmacy has not received anything and she is in a lot of pain. Please Advise.

## 2018-08-28 NOTE — Patient Instructions (Addendum)
For chronic knee pain will advise tylenol for mild pain and rx tramadol for more severe pain. Might in near future rx nsaid but need your bp to be better controlled.  For htn. Continue losartan 100 mg daily. Add amlodipine 10 mg daily. Part of your bp reading being high due to noncompliance today so expect her bp to come down once back on meds.. If any cardiac or neurologic signs or symptoms then go to ED.  For allegies rx flonase refill.  Follow up 10 days virtual visit

## 2018-08-29 MED ORDER — AMLODIPINE BESYLATE 10 MG PO TABS: 10.0000 mg | ORAL_TABLET | Freq: Every day | ORAL | 3 refills | Status: DC

## 2018-08-29 MED ORDER — FLUTICASONE PROPIONATE 50 MCG/ACT NA SUSP: 2.0000 | Freq: Every day | NASAL | 1 refills | Status: DC

## 2018-08-29 MED ORDER — TRAMADOL HCL 50 MG PO TABS: 50.0000 mg | ORAL_TABLET | Freq: Three times a day (TID) | ORAL | 0 refills | Status: AC | PRN

## 2018-08-29 NOTE — Telephone Encounter (Signed)
I sent medication to her pharmacy. Will you double check if there is issue filling?

## 2018-08-29 NOTE — Telephone Encounter (Signed)
Medications sent today

## 2018-08-29 NOTE — Telephone Encounter (Signed)
Patietn states she did get them.

## 2018-08-29 NOTE — Addendum Note (Signed)
Addended by: Anabel Halon on: 08/29/2018 09:53 AM   Modules accepted: Orders

## 2018-08-29 NOTE — Addendum Note (Signed)
Addended by: Anabel Halon on: 08/29/2018 08:25 AM   Modules accepted: Orders

## 2018-08-30 ENCOUNTER — Telehealth: Payer: Self-pay | Admitting: Medical

## 2018-08-30 NOTE — Telephone Encounter (Signed)
Please advise- virtual visit completed on 5/8.

## 2018-08-30 NOTE — Telephone Encounter (Signed)
Copied from [place] [id number]. Topic: Quick Communication - Rx Refill/Question >> Aug 30, 2018  9:47 AM Debra Ayers wrote: Medication: traMADol (ULTRAM) 50 MG tablet - pt states this is not working for her - she said her BP is under control and she is requesting an anti-inflammatory Pt states last night she started having a bad ear ache in right ear as well. She is hoping something can be sent to control her ear pain (possible ear infection). She said the pain is on the inside of the back of her ear. She is very congested but she is not able to get anything out (blowing nose or coughing up).   Preferred Pharmacy (with phone number or street name): [id number] - [address], [place] AT [facility] [address] [phone] (Phone) [phone] (Fax)

## 2018-08-30 NOTE — Telephone Encounter (Signed)
Ear is new problem. Her bp was extremely high the other day so need her to check bp today. Not convinced will give her nsaid presently. Mabye best prednisone and maybe need to give her hydrocodone.  So sorry to say she need virtual viisit. Saw her early in week but ear pain is new problem.

## 2018-09-03 NOTE — Telephone Encounter (Signed)
Virtual visit 5/18.

## 2018-09-09 ENCOUNTER — Encounter: Payer: Self-pay | Admitting: Medical

## 2018-09-09 ENCOUNTER — Other Ambulatory Visit: Payer: Self-pay

## 2018-09-09 ENCOUNTER — Ambulatory Visit (INDEPENDENT_AMBULATORY_CARE_PROVIDER_SITE_OTHER): Payer: BLUE CROSS/BLUE SHIELD | Admitting: Medical

## 2018-09-09 VITALS — BP 148/84

## 2018-09-09 DIAGNOSIS — M25562 Pain in left knee: Secondary | ICD-10-CM | POA: Diagnosis not present

## 2018-09-09 DIAGNOSIS — R05 Cough: Secondary | ICD-10-CM | POA: Diagnosis not present

## 2018-09-09 DIAGNOSIS — J301 Allergic rhinitis due to pollen: Secondary | ICD-10-CM | POA: Diagnosis not present

## 2018-09-09 DIAGNOSIS — R059 Cough, unspecified: Secondary | ICD-10-CM

## 2018-09-09 DIAGNOSIS — I10 Essential (primary) hypertension: Secondary | ICD-10-CM | POA: Diagnosis not present

## 2018-09-09 DIAGNOSIS — G8929 Other chronic pain: Secondary | ICD-10-CM

## 2018-09-09 MED ORDER — AZELASTINE HCL 0.1 % NA SOLN: 2.0000 | Freq: Two times a day (BID) | NASAL | 0 refills | Status: DC

## 2018-09-09 MED ORDER — HYDROCODONE-HOMATROPINE 5-1.5 MG/5ML PO SYRP: 5.0000 mL | ORAL_SOLUTION | Freq: Four times a day (QID) | ORAL | 0 refills | Status: DC | PRN

## 2018-09-09 MED ORDER — LEVOCETIRIZINE DIHYDROCHLORIDE 5 MG PO TABS: 5.0000 mg | ORAL_TABLET | Freq: Every evening | ORAL | 0 refills | Status: DC

## 2018-09-09 NOTE — Patient Instructions (Addendum)
You appear to have likely allergic rhinitis symptoms.  Symptoms appear to be causing postnasal drainage as well as a cough.  If cough gets worse, productive or if any fever then notify me.  Presently prescribing Xyzal antihistamine, Astelin nasal spray and want you to continue with Flonase nasal spray.  Also making Hycodan available for more severe cough at night.  If cough not improved with above measures then let me know as in that event I could give you instructions on where to get COVID testing.  Currently not suspicious that you have this but in light of your husband's chronic medical problems will proceed with more caution than usual.  Giving update by Thursday morning.  If any recurrent ear pain will rx antibiotic.  Your blood pressure is improved now but not ideally.  I want you to keep checking her blood pressure daily but do 3 consecutive readings with 5 minutes between each reading.  I want to see if third reading is giving BP better/less than 140/90.  For your left knee pain, I offered referral to sports medicine today.  He declined presently.  I want you to stop tramadol since it did not help with your pain.  The cough medicine the Hycodan does have hydrocodone in it and this might give you some partial knee pain relief.  Let me know if you want me to go ahead place the sports medicine referral.  Follow-up date to be determined after assessing clinical response to above treatment.

## 2018-09-09 NOTE — Progress Notes (Signed)
Subjective:    Patient ID: Unknown Jim, female    DOB: [birth year], 48 y.o.   MRN: [id number]  HPI  Virtual Visit via Video Note  I connected with Unknown Jim on 09/09/18 at  1:00 PM EDT by a video enabled telemedicine application and verified that I am speaking with the correct person using two identifiers.  Location: Patient: home Provider: home   I discussed the limitations of evaluation and management by telemedicine and the availability of in person appointments. The patient expressed understanding and agreed to proceed.  History of Present Illness:   Pt states her bp was 148/84. Her bp was 198/100.  Pt states rt ear pain over the weekend. Pt states she feels very nasal congested for about 5-7 days. She thinks allergy related. Rare  Dry cough after feeling pnd. No fever. She states can't hear well from rt ear. She had ear pain this weekend. But no ear pain now.  Pt bp is better controlled now than it was. No cardio or neuro signs or symptoms.  Pt left knee pain still hurts. Tramadol did not help pain.She is still does not want to be referred to sport med.   Observations/Objective:  General-no acute distress.  Pleasant. HEENT- sounds nasally congested.  No ear pain presently. Lungs- on inspection respirations appear even and unlabored.  Assessment and Plan: You appear to have likely allergic rhinitis symptoms.  Symptoms appear to be causing postnasal drainage as well as a cough.  If cough gets worse, productive or if any fever then notify me.  Presently prescribing Xyzal antihistamine, Astelin nasal spray and want you to continue with Flonase nasal spray.  Also making Hycodan available for more severe cough at night.  If cough not improved with above measures then let me know as in that event I could give you instructions on where to get COVID testing.  Currently not suspicious that you have this but in light of your husband's chronic medical problems will proceed  with more caution than usual.  Giving update by Thursday morning.  Your blood pressure is improved now but not ideally.  I want you to keep checking her blood pressure daily but do 3 consecutive readings with 5 minutes between each reading.  I want to see if third reading is giving BP better/less than 140/90.  For your left knee pain, I offered referral to sports medicine today.  He declined presently.  I want you to stop tramadol since it did not help with your pain.  The cough medicine the Hycodan does have hydrocodone in it and this might give you some partial knee pain relief.  Let me know if you want me to go ahead place the sports medicine referral.  Follow-up date to be determined after assessing clinical response to above treatment.  Mackie Pai, PA-C  Follow Up Instructions:    I discussed the assessment and treatment plan with the patient. The patient was provided an opportunity to ask questions and all were answered. The patient agreed with the plan and demonstrated an understanding of the instructions.   The patient was advised to call back or seek an in-person evaluation if the symptoms worsen or if the condition fails to improve as anticipated.  25 minutes with pt. 50% of time spent counseling pt on treatment plan going forward for conditions. As well as plan if cough persists.   Mackie Pai, PA-C    Review of Systems  Constitutional: Negative for chills and fatigue.  HENT: Positive for congestion, ear pain and postnasal drip.   Respiratory: Positive for cough. Negative for chest tightness, shortness of breath and wheezing.   Cardiovascular: Negative for chest pain and palpitations.  Genitourinary: Negative for dyspareunia, dysuria, flank pain and pelvic pain.  Musculoskeletal:       Knee pain  Neurological: Negative for dizziness.  Hematological: Negative for adenopathy. Does not bruise/bleed easily.  Psychiatric/Behavioral: Negative for behavioral problems and  confusion.   Past Medical History:  Diagnosis Date  . Anemia   . Heart murmur   . Hypertension    medication  . Uterine fibroid      Social History   Socioeconomic History  . Marital status: Legally Separated    Spouse name: Not on file  . Number of children: 2  . Years of education: Not on file  . Highest education level: Not on file  Occupational History    Employer: [employer]  Social Needs  . Financial resource strain: Not on file  . Food insecurity:    Worry: Not on file    Inability: Not on file  . Transportation needs:    Medical: Not on file    Non-medical: Not on file  Tobacco Use  . Smoking status: Never Smoker  . Smokeless tobacco: Never Used  Substance and Sexual Activity  . Alcohol use: Yes    Alcohol/week: 3.0 standard drinks    Types: 3 Cans of beer per week  . Drug use: No  . Sexual activity: Yes    Birth control/protection: I.U.D.  Lifestyle  . Physical activity:    Days per week: Not on file    Minutes per session: Not on file  . Stress: Not on file  Relationships  . Social connections:    Talks on phone: Not on file    Gets together: Not on file    Attends religious service: Not on file    Active member of club or organization: Not on file    Attends meetings of clubs or organizations: Not on file    Relationship status: Not on file  . Intimate partner violence:    Fear of current or ex partner: Not on file    Emotionally abused: Not on file    Physically abused: Not on file    Forced sexual activity: Not on file  Other Topics Concern  . Not on file  Social History Narrative   Has 47 yr old and 47 year old   Works as Dance movement psychotherapist at [employer]   Separated   She completed 2 years college   Enjoys entertaining friends    Past Surgical History:  Procedure Laterality Date  . BACK SURGERY  2004   Benign fatty tumor removed from mid upper back  . BREAST ENHANCEMENT SURGERY  2010  . BREAST IMPLANT REMOVAL  2013  . BREAST SURGERY   2014   Left breast biopsy, benign  . TUBAL LIGATION  2001  . UMBILICAL HERNIA REPAIR  2018    Family History  Problem Relation Age of Onset  . Arthritis Mother   . Osteoporosis Mother   . Diabetes Mother   . Diabetes Maternal Grandmother   . Diabetes Son   . Diabetes Maternal Aunt   . Diabetes Maternal Uncle     No Known Allergies  Current Outpatient Medications on File Prior to Visit  Medication Sig Dispense Refill  . amLODipine (NORVASC) 10 MG tablet Take 1 tablet (10 mg total) by mouth  daily. 30 tablet 3  . fluticasone (FLONASE) 50 MCG/ACT nasal spray Place 2 sprays into both nostrils daily. 16 g 1  . guaiFENesin (MUCINEX) 600 MG 12 hr tablet Take 1 tablet (600 mg total) by mouth 2 (two) times daily as needed for cough or to loosen phlegm. 14 tablet 0  . losartan-hydrochlorothiazide (HYZAAR) 100-25 MG tablet Take 1 tablet by mouth daily. 30 tablet 11  . oseltamivir (TAMIFLU) 75 MG capsule Take 1 capsule (75 mg total) by mouth 2 (two) times daily. 10 capsule 0   No current facility-administered medications on file prior to visit.     BP (!) 148/84       Objective:   Physical Exam        Assessment & Plan:

## 2018-10-08 ENCOUNTER — Other Ambulatory Visit: Payer: Self-pay | Admitting: Medical

## 2019-02-11 ENCOUNTER — Encounter (HOSPITAL_BASED_OUTPATIENT_CLINIC_OR_DEPARTMENT_OTHER): Payer: Self-pay | Admitting: *Deleted

## 2019-02-11 ENCOUNTER — Emergency Department (HOSPITAL_BASED_OUTPATIENT_CLINIC_OR_DEPARTMENT_OTHER)
Admission: EM | Admit: 2019-02-11 | Discharge: 2019-02-11 | Disposition: A | Discharge status: Home / Self Care | Payer: BC Managed Care – PPO | Attending: Emergency Medicine | Admitting: Emergency Medicine

## 2019-02-11 ENCOUNTER — Other Ambulatory Visit: Payer: Self-pay

## 2019-02-11 ENCOUNTER — Ambulatory Visit: Payer: Self-pay

## 2019-02-11 DIAGNOSIS — U071 COVID-19: Secondary | ICD-10-CM | POA: Diagnosis not present

## 2019-02-11 DIAGNOSIS — R0981 Nasal congestion: Secondary | ICD-10-CM

## 2019-02-11 DIAGNOSIS — G44209 Tension-type headache, unspecified, not intractable: Secondary | ICD-10-CM | POA: Insufficient documentation

## 2019-02-11 DIAGNOSIS — I10 Essential (primary) hypertension: Secondary | ICD-10-CM | POA: Insufficient documentation

## 2019-02-11 DIAGNOSIS — Z20828 Contact with and (suspected) exposure to other viral communicable diseases: Secondary | ICD-10-CM | POA: Diagnosis not present

## 2019-02-11 DIAGNOSIS — R519 Headache, unspecified: Secondary | ICD-10-CM | POA: Diagnosis not present

## 2019-02-11 LAB — URINALYSIS, ROUTINE W REFLEX MICROSCOPIC
Bilirubin Urine: NEGATIVE
Glucose, UA: NEGATIVE mg/dL
Ketones, ur: NEGATIVE mg/dL
Leukocytes,Ua: NEGATIVE
Nitrite: NEGATIVE
Protein, ur: NEGATIVE mg/dL
Specific Gravity, Urine: >1.03 — ABNORMAL HIGH (ref 1.005–1.030)
pH: 5.5 (ref 5.0–8.0)

## 2019-02-11 LAB — CBC WITH DIFFERENTIAL/PLATELET
Abs Immature Granulocytes: 0.01 10*3/uL (ref 0.00–0.07)
Basophils Absolute: 0 10*3/uL (ref 0.0–0.1)
Basophils Relative: 0 %
Eosinophils Absolute: 0.1 10*3/uL (ref 0.0–0.5)
Eosinophils Relative: 1 %
HCT: 44.8 % (ref 36.0–46.0)
Hemoglobin: 14.4 g/dL (ref 12.0–15.0)
Immature Granulocytes: 0 %
Lymphocytes Relative: 8 %
Lymphs Abs: 0.7 10*3/uL (ref 0.7–4.0)
MCH: 28.6 pg (ref 26.0–34.0)
MCHC: 32.1 g/dL (ref 30.0–36.0)
MCV: 89.1 fL (ref 80.0–100.0)
Monocytes Absolute: 0.9 10*3/uL (ref 0.1–1.0)
Monocytes Relative: 12 %
Neutro Abs: 6 10*3/uL (ref 1.7–7.7)
Neutrophils Relative %: 79 %
Platelets: 237 10*3/uL (ref 150–400)
RBC: 5.03 MIL/uL (ref 3.87–5.11)
RDW: 12.8 % (ref 11.5–15.5)
WBC: 7.7 10*3/uL (ref 4.0–10.5)
nRBC: 0 % (ref 0.0–0.2)

## 2019-02-11 LAB — URINALYSIS, MICROSCOPIC (REFLEX): WBC, UA: NONE SEEN WBC/hpf (ref 0–5)

## 2019-02-11 LAB — BASIC METABOLIC PANEL
Anion gap: 10 (ref 5–15)
BUN: 11 mg/dL (ref 6–20)
CO2: 24 mmol/L (ref 22–32)
Calcium: 9.3 mg/dL (ref 8.9–10.3)
Chloride: 104 mmol/L (ref 98–111)
Creatinine, Ser: 0.81 mg/dL (ref 0.44–1.00)
GFR calc Af Amer: >60 mL/min (ref >60)
GFR calc non Af Amer: >60 mL/min (ref >60)
Glucose, Bld: 98 mg/dL (ref 70–99)
Potassium: 3.1 mmol/L — ABNORMAL LOW (ref 3.5–5.1)
Sodium: 138 mmol/L (ref 135–145)

## 2019-02-11 MED ORDER — AMLODIPINE BESYLATE 10 MG PO TABS: 10.0000 mg | ORAL_TABLET | Freq: Every day | ORAL | 0 refills | Status: DC

## 2019-02-11 MED ORDER — KETOROLAC TROMETHAMINE 30 MG/ML IJ SOLN
30.0000 mg | Freq: Once | INTRAMUSCULAR | Status: AC
  Administered 2019-02-11: 20:00:00 30 mg via INTRAVENOUS
  Filled 2019-02-11: qty 1

## 2019-02-11 NOTE — ED Triage Notes (Signed)
Headache and facial pain x 3 days. [facility] and her BP was 210/120.

## 2019-02-11 NOTE — ED Notes (Signed)
Pt on monitor 

## 2019-02-11 NOTE — ED Provider Notes (Signed)
Emergency Department Provider Note   I have reviewed the triage vital signs and the nursing notes.   HISTORY  Chief Complaint Hypertension   HPI Debra Ayers is a 48 y.o. female with past medical history reviewed below including hypertension presents to the emergency department with headache and facial pain over the past 3 days.  Patient states that she gets occasional allergy type symptoms which are similar.  She began using Flonase with no relief.  She has not been taking day cold/flu medications.  She has been compliant with her home blood pressure medications but stopped taking the amlodipine.  She has developed a mild frontal headache with pain underneath both eyes.  No sudden onset, maximal intensity headache symptoms.  No weakness or numbness.  No vision changes.  She denies any chest pain.  [facility] and took her blood pressure and found it to be elevated to 210/120.  She was directed to the emergency department.   Past Medical History:  Diagnosis Date  . Anemia   . Heart murmur   . Hypertension    medication  . Uterine fibroid     Patient Active Problem List   Diagnosis Date Noted  . Right elbow pain 11/29/2015  . Visit for preventive health examination 09/10/2015  . Chronic idiopathic constipation 08/12/2014  . Family history of diabetes mellitus 06/25/2013  . Umbilical hernia, incarcerated 11/20/2012  . Herpetic whitlow 02/12/2012  . HTN (hypertension) 02/12/2012  . Hearing problem 02/12/2012    Past Surgical History:  Procedure Laterality Date  . BACK SURGERY  2004   Benign fatty tumor removed from mid upper back  . BREAST ENHANCEMENT SURGERY  2010  . BREAST IMPLANT REMOVAL  2013  . BREAST SURGERY  2014   Left breast biopsy, benign  . TUBAL LIGATION  2001  . UMBILICAL HERNIA REPAIR  2018    Allergies Patient has no known allergies.  Family History  Problem Relation Age of Onset  . Arthritis Mother    . Osteoporosis Mother   . Diabetes Mother   . Diabetes Maternal Grandmother   . Diabetes Son   . Diabetes Maternal Aunt   . Diabetes Maternal Uncle     Social History Social History   Tobacco Use  . Smoking status: Never Smoker  . Smokeless tobacco: Never Used  Substance Use Topics  . Alcohol use: Yes    Alcohol/week: 3.0 standard drinks    Types: 3 Cans of beer per week  . Drug use: No    Review of Systems  Constitutional: No fever/chills Eyes: No visual changes. ENT: No sore throat. Positive congestion and face pain.  Cardiovascular: Denies chest pain. Positive HTN.  Respiratory: Denies shortness of breath. Gastrointestinal: No abdominal pain.  No nausea, no vomiting.  No diarrhea.  No constipation. Genitourinary: Negative for dysuria. Musculoskeletal: Negative for back pain. Skin: Negative for rash. Neurological: Negative for focal weakness or numbness. Positive HA.   10-point ROS otherwise negative.  ____________________________________________   PHYSICAL EXAM:  VITAL SIGNS: ED Triage Vitals  Enc Vitals Group     BP 02/11/19 1751 (!) 199/116     Pulse Rate 02/11/19 1751 91     Resp 02/11/19 1751 16     Temp 02/11/19 1751 99.3 F (37.4 C)     Temp Source 02/11/19 1751 Oral     SpO2 02/11/19 1751 99 %     Weight 02/11/19 1747 163 lb (73.9 kg)  Height 02/11/19 1747 5\' 3"  (1.6 m)   Constitutional: Alert and oriented. Well appearing and in no acute distress. Eyes: Conjunctivae are normal. PERRL. EOMI. Head: Atraumatic. Nose: Mild congestion/rhinnorhea. Mouth/Throat: Mucous membranes are moist.   Neck: No stridor.  Cardiovascular: Normal rate, regular rhythm. Good peripheral circulation. Grossly normal heart sounds.   Respiratory: Normal respiratory effort.  No retractions. Lungs CTAB. Gastrointestinal: Soft and nontender. No distention.  Musculoskeletal: No lower extremity tenderness nor edema. No gross deformities of extremities. Neurologic:  Normal  speech and language. No gross focal neurologic deficits are appreciated.  Skin:  Skin is warm, dry and intact. No rash noted.  ____________________________________________   LABS (all labs ordered are listed, but only abnormal results are displayed)  Labs Reviewed  BASIC METABOLIC PANEL - Abnormal; Notable for the following components:      Result Value   Potassium 3.1 (*)    All other components within normal limits  URINALYSIS, ROUTINE W REFLEX MICROSCOPIC - Abnormal; Notable for the following components:   Specific Gravity, Urine >1.030 (*)    Hgb urine dipstick SMALL (*)    All other components within normal limits  URINALYSIS, MICROSCOPIC (REFLEX) - Abnormal; Notable for the following components:   Bacteria, UA RARE (*)    All other components within normal limits  NOVEL CORONAVIRUS, NAA (HOSP ORDER, SEND-OUT TO REF LAB; TAT 18-24 HRS)  CBC WITH DIFFERENTIAL/PLATELET   ____________________________________________  EKG   EKG Interpretation  Date/Time:  Tuesday February 11 2019 18:05:10 EDT Ventricular Rate:  91 PR Interval:    QRS Duration: 87 QT Interval:  365 QTC Calculation: 450 R Axis:   59 Text Interpretation:  Sinus rhythm Biatrial enlargement Borderline repolarization abnormality No STEMI. No prior tracing for comparison.  Confirmed by Nanda Quinton [phone]) on 02/11/2019 6:09:14 PM       ____________________________________________  RADIOLOGY  None ____________________________________________   PROCEDURES  Procedure(s) performed:   Procedures  None ____________________________________________   INITIAL IMPRESSION / ASSESSMENT AND PLAN / ED COURSE  Pertinent labs & imaging results that were available during my care of the patient were reviewed by me and considered in my medical decision making (see chart for details).   Patient presents to the emergency department evaluation of headache and facial pain over the past 3 days.  Symptoms seem  consistent with sinus congestion.  I do plan for COVID-19 testing with borderline elevated temperature here.  Patient does have significant hypertension.  She is not been taking over-the-counter cold/flu medications.  No evidence on exam to suspect acute hypertensive emergency.  I will obtain screening labs including UA and chemistry.  Plan to restart the patient's amlodipine.   Labs interpreted. Sending COVID testing as well. Patient to isolate at home and follow results in [place]. BP down-trending without intervention. No indication for neuro imaging at this time.  ____________________________________________  FINAL CLINICAL IMPRESSION(S) / ED DIAGNOSES  Final diagnoses:  Essential hypertension  Acute non intractable tension-type headache  Sinus congestion     MEDICATIONS GIVEN DURING THIS VISIT:  Medications  ketorolac (TORADOL) 30 MG/ML injection 30 mg (30 mg Intravenous Given 02/11/19 1938)     Note:  This document was prepared using Dragon voice recognition software and may include unintentional dictation errors.  Nanda Quinton, MD, [facility]    Long, [facility], MD 02/12/19 1332

## 2019-02-11 NOTE — Discharge Instructions (Signed)
You were seen in the emergency department today with elevated blood pressure, congestion symptoms.  I am restarting your amlodipine with your high blood pressure.  I have also tested you for COVID-19.  You can follow the results and MyChart.  Please return to the emergency department immediately if you develop any chest pain, shortness of breath, sudden severe headache.  Please follow closely with your primary doctor regarding your high blood pressure and make them aware of your recent medication change.  Please call first thing tomorrow morning to schedule the next available appointment.

## 2019-02-11 NOTE — Telephone Encounter (Signed)
Pt c/o BP 212/120 taken 15 minutes prior to call. [facility] Pt has h/o HTN and denies missed doses. Pt c/o headache. Pt stated it feels like head congestion. She stated she took 2 seoarate doses of Flonase but did not get the relief she normally gets. Denies chest pain, vion changes, weakness or lightheadedness  Care advice given and pt was hesitant on going to ED. Discussed potential complications of HTN ([facility]). Pt is going to go to ED at [facility].     Reason for Disposition . [id number] Systolic BP  >= 0000000 OR Diastolic >= [id number] AND [id number] cardiac or neurologic symptoms (e.g., chest pain, difficulty breathing, unsteady gait, blurred vision)  Answer Assessment - Initial Assessment Questions 1. BLOOD PRESSURE: "What is the blood pressure?" "Did you take at least two measurements 5 minutes apart?"     212/120 2. ONSET: "When did you take your blood pressure?"     [facility]  3. HOW: "How did you obtain the blood pressure?" (e.g., visiting nurse, automatic home BP monitor)     [facility] 4. HISTORY: "Do you have a history of high blood pressure?"    Yes 5. MEDICATIONS: "Are you taking any medications for blood pressure?" "Have you missed any doses recently?"     Yes-no 6. OTHER SYMPTOMS: "Do you have any symptoms?" (e.g., headache, chest pain, blurred vision, difficulty breathing, weakness)     headache 7. PREGNANCY: "Is there any chance you are pregnant?" "When was your last menstrual period?"    menopausal  Protocols used: HIGH BLOOD PRESSURE-A-AH

## 2019-02-12 ENCOUNTER — Encounter: Payer: Self-pay | Admitting: Medical

## 2019-02-12 ENCOUNTER — Ambulatory Visit (INDEPENDENT_AMBULATORY_CARE_PROVIDER_SITE_OTHER): Payer: BC Managed Care – PPO | Admitting: Medical

## 2019-02-12 VITALS — BP 165/104 | Temp 99.8°F | Ht 63.0 in | Wt 163.0 lb

## 2019-02-12 DIAGNOSIS — J3489 Other specified disorders of nose and nasal sinuses: Secondary | ICD-10-CM

## 2019-02-12 DIAGNOSIS — R519 Headache, unspecified: Secondary | ICD-10-CM | POA: Diagnosis not present

## 2019-02-12 DIAGNOSIS — I10 Essential (primary) hypertension: Secondary | ICD-10-CM | POA: Diagnosis not present

## 2019-02-12 DIAGNOSIS — J309 Allergic rhinitis, unspecified: Secondary | ICD-10-CM | POA: Diagnosis not present

## 2019-02-12 LAB — NOVEL CORONAVIRUS, NAA (HOSP ORDER, SEND-OUT TO REF LAB; TAT 18-24 HRS): SARS-CoV-2, NAA: DETECTED — AB

## 2019-02-12 MED ORDER — FLUTICASONE PROPIONATE 50 MCG/ACT NA SUSP: 2.0000 | Freq: Every day | NASAL | 1 refills | Status: DC

## 2019-02-12 MED ORDER — AMOXICILLIN-POT CLAVULANATE 875-125 MG PO TABS: 1.0000 | ORAL_TABLET | Freq: Two times a day (BID) | ORAL | 0 refills | Status: DC

## 2019-02-12 MED ORDER — LOSARTAN POTASSIUM 100 MG PO TABS: 100.0000 mg | ORAL_TABLET | Freq: Every day | ORAL | 3 refills | Status: DC

## 2019-02-12 MED ORDER — HYDROCODONE-ACETAMINOPHEN 5-325 MG PO TABS: ORAL_TABLET | ORAL | 0 refills | Status: DC

## 2019-02-12 NOTE — Progress Notes (Signed)
Subjective:    Patient ID: Debra Ayers, female    DOB: October 24, 1970, 48 y.o.   MRN: [id number]  HPI  Virtual Visit via Video Note  I connected with Debra Ayers on 02/12/19 at  1:40 PM EDT by a video enabled telemedicine application and verified that I am speaking with the correct person using two identifiers.  Location: Patient: home Provider: office   I discussed the limitations of evaluation and management by telemedicine and the availability of in person appointments. The patient expressed understanding and agreed to proceed.  History of Present Illness:  Pt had very high bp yesterday in 220/110 range. Pt states in ED they gave her medication for had. She states has sinus pressure as well. Pt feels like she may have allergy flare. In the ED they did covid test. It is pending.  Pt feels like concrete in her sinus. Mild chest congestion. She can taste mucus when she swallows and when she gets occasional cough.  Pt was given toradol iv yesterday but ha persists.  Pt states given rx for bp but they gave her medication that she was already taking.  Pt bp presently 165/104. Pt has not been consistant on amlodipine. She did restart med last week.  Pt had been on losartan/hctz in the past but she stopped due to excess urination with the hctz component.     Observations/Objective: General-no acute distress, pleasant, oriented. Lungs- on inspection lungs appear unlabored. Neck- no tracheal deviation or jvd on inspection. Neuro- gross motor function appears intact. Finger to nose intact. Symmetric smile heent- frontal and maxillary sinus pressure.  Assessment and Plan: You had severe high blood pressure yesterday was all quite worrisome.  Thankfully blood pressure is at much more reasonable level presently.  Still not ideal BP level.  You admitted noncompliance with amlodipine over the past months.  Recently restarted the amlodipine 1 week ago.  So I do think you need  additional medication to get better blood pressure control.  Will prescribe losartan 100mg  dose to take daily.  Formally you are on losartan but did not like the diuretic side effect.  If you have severe recurrent high blood pressure as yesterday or neurologic signs/symptoms and recommend ED evaluation again.  Presently also reports some nasal congestion and sinus pressure.  He might have recurrence allergy flare with sinus infection.  Use Flonase nasal spray and prescribed Augmentin antibiotic.  Your recent headache could be combination of headache from blood pressure elevation as well as sinus infection.  During the interim while you start the above treatment making limited number of Norco available.  This might help with sleep and decrease your pain.  You do have Covid test pending and would asked that you update me when you get the results.  Since emergency department what the test results were not likely be sent to me.  Follow-up date to be determined depending on how clear doing clinically.  We will wait for your my chart update regarding your Covid test results, blood pressure level and update regarding if you still have any   Mackie Pai, PA-C  Follow Up Instructions:    I discussed the assessment and treatment plan with the patient. The patient was provided an opportunity to ask questions and all were answered. The patient agreed with the plan and demonstrated an understanding of the instructions.   The patient was advised to call back or seek an in-person evaluation if the symptoms worsen or if the condition fails  to improve as anticipated.  I provided  25 minutes of non-face-to-face time during this encounter.   Mackie Pai, PA-C   Review of Systems  Constitutional: Negative for chills, fatigue and fever.  HENT: Positive for congestion, sinus pressure and sinus pain.   Respiratory: Negative for cough, shortness of breath and wheezing.   Cardiovascular: Negative for chest  pain and palpitations.  Gastrointestinal: Negative for abdominal pain.  Musculoskeletal: Negative for back pain.  Neurological: Positive for headaches. Negative for dizziness, syncope, weakness and numbness.  Hematological: Negative for adenopathy. Does not bruise/bleed easily.  Psychiatric/Behavioral: Negative for behavioral problems and confusion.       Objective:   Physical Exam        Assessment & Plan:

## 2019-02-12 NOTE — Patient Instructions (Addendum)
You had severe high blood pressure yesterday was all quite worrisome.  Thankfully blood pressure is at much more reasonable level presently.  Still not ideal BP level.  You admitted noncompliance with amlodipine over the past months.  Recently restarted the amlodipine 1 week ago.  So I do think you need additional medication to get better blood pressure control.  Will prescribe losartan 100mg  dose to take daily.  Formally you are on losartan but did not like the diuretic side effect.  If you have severe recurrent high blood pressure as yesterday or neurologic signs/symptoms and recommend ED evaluation again.  Presently also reports some nasal congestion and sinus pressure.  He might have recurrence allergy flare with sinus infection.  Use Flonase nasal spray and prescribed Augmentin antibiotic.  Your recent headache could be combination of headache from blood pressure elevation as well as sinus infection.  During the interim while you start the above treatment making limited number of Norco available.  This might help with sleep and decrease your pain.  You do have Covid test pending and would asked that you update me when you get the results.  Since emergency department what the test results were not likely be sent to me.  Follow-up date to be determined depending on how clear doing clinically.  We will wait for your my chart update regarding your Covid test results, blood pressure level and update regarding if you still have any headache.

## 2019-02-13 ENCOUNTER — Ambulatory Visit (INDEPENDENT_AMBULATORY_CARE_PROVIDER_SITE_OTHER): Payer: BC Managed Care – PPO | Admitting: Medical

## 2019-02-13 ENCOUNTER — Other Ambulatory Visit: Payer: Self-pay

## 2019-02-13 ENCOUNTER — Encounter: Payer: Self-pay | Admitting: Medical

## 2019-02-13 VITALS — BP 150/89 | Temp 99.1°F

## 2019-02-13 DIAGNOSIS — J3489 Other specified disorders of nose and nasal sinuses: Secondary | ICD-10-CM | POA: Diagnosis not present

## 2019-02-13 DIAGNOSIS — U071 COVID-19: Secondary | ICD-10-CM | POA: Diagnosis not present

## 2019-02-13 DIAGNOSIS — R0989 Other specified symptoms and signs involving the circulatory and respiratory systems: Secondary | ICD-10-CM | POA: Diagnosis not present

## 2019-02-13 MED ORDER — AZITHROMYCIN 250 MG PO TABS: ORAL_TABLET | ORAL | 0 refills | Status: DC

## 2019-02-13 NOTE — Patient Instructions (Signed)
For your covid infection we need to follow you closely. Educated on signs/symptoms that would indicate need to ED evaluation.  For sinus pressure/infection and worse chest congestion since yesterday, I recommend continue augmentin and azithromycin. If diarrhea with antibiotics then stop augmentin and let me know.  Continue same other meds rx'd yesterday.  Follow up Monday by my chart. Please update me how you feel. Sooner if needed. Stay at home and quarantine. Order to get your husband tested.

## 2019-02-13 NOTE — Progress Notes (Signed)
   Subjective:    Patient ID: Debra Ayers, female    DOB: May 12, 1970, 48 y.o.   MRN: [id number]  HPI   Virtual Visit via Telephone Note  I connected with Debra Ayers on 02/13/19 at  9:40 AM EDT by telephone and verified that I am speaking with the correct person using two identifiers.  Location: Patient: home Provider: office   I discussed the limitations, risks, security and privacy concerns of performing an evaluation and management service by telephone and the availability of in person appointments. I also discussed with the patient that there may be a patient responsible charge related to this service. The patient expressed understanding and agreed to proceed.   History of Present Illness: Pt still has some moderate ha. No diarrhea. Has some chest congestion. No sob or wheezing. No significant cough reported.   Pt still has some ha but no gross motor or sensory function deficits.  No myalgia.  Mild fatigue.  Overall she states she does feel better.     Observations/Objective: General- no acute distress, pleasant,no acute distress. Oriented.  Heent- sinus pressure still persists.   Assessment and Plan: For your covid infection we need to follow you closely. Educated on signs/symptoms that would indicate need to ED evaluation.  For sinus pressure/infection and worse chest congestion since yesterday, I recommend continue augmentin and azithromycin. If diarrhea with antibiotics then stop augmentin and let me know.  Continue same other meds rx'd yesterday.  Follow up Monday by my chart. Please update me how you feel. Sooner if needed. Stay at home and quarantine. Order to get your husband tested.  Mackie Pai, PA-C  Follow Up Instructions:    I discussed the assessment and treatment plan with the patient. The patient was provided an opportunity to ask questions and all were answered. The patient agreed with the plan and demonstrated an understanding of  the instructions.   The patient was advised to call back or seek an in-person evaluation if the symptoms worsen or if the condition fails to improve as anticipated.  I provided 25 minutes of non-face-to-face time during this encounter.   Mackie Pai, PA-C    Review of Systems  Constitutional: Negative for chills, fatigue and fever.  HENT: Positive for congestion. Negative for postnasal drip, sinus pressure, sneezing, sore throat and tinnitus.   Respiratory: Positive for cough. Negative for chest tightness, shortness of breath and wheezing.        Minimal rare cough.  Cardiovascular: Negative for chest pain and palpitations.  Gastrointestinal: Negative for abdominal pain.  Genitourinary: Negative for dysuria, flank pain and frequency.  Musculoskeletal: Negative for back pain and myalgias.  Neurological: Positive for headaches. Negative for dizziness, syncope, speech difficulty and numbness.  Hematological: Negative for adenopathy. Does not bruise/bleed easily.  Psychiatric/Behavioral: Negative for confusion.       Objective:   Physical Exam        Assessment & Plan:

## 2019-02-14 ENCOUNTER — Encounter: Payer: Self-pay | Admitting: Medical

## 2019-02-14 ENCOUNTER — Telehealth: Payer: Self-pay | Admitting: Medical

## 2019-02-14 NOTE — Telephone Encounter (Signed)
Last hydrocodone - acteaminophen  RX: 02/12/19, #4 Last OV: 02/13/19 Next OV: none scheduled UDS:  Not on file CSC: Not on file

## 2019-02-14 NOTE — Telephone Encounter (Signed)
Medication Refill - Medication: HYDROcodone-acetaminophen (NORCO) 5-325 MG tablet  Pt needs refill for headaches   Has the patient contacted their pharmacy? No. (Agent: If no, request that the patient contact the pharmacy for the refill.) (Agent: If yes, when and what did the pharmacy advise?)  Preferred Pharmacy (with phone number or street name):  [id number] - [place], [place] AT [address] [phone] (Phone) [phone] (Fax     Agent: Please be advised that RX refills may take up to 3 business days. We ask that you follow-up with your pharmacy.

## 2019-02-16 ENCOUNTER — Telehealth: Payer: Self-pay | Admitting: Medical

## 2019-02-16 NOTE — Telephone Encounter (Signed)
Will you contact health dept on covid positive result. Is there a form that we fill out and fax over?

## 2019-02-17 ENCOUNTER — Telehealth: Payer: Self-pay | Admitting: Medical

## 2019-02-17 MED ORDER — ONDANSETRON 8 MG PO TBDP: 8.0000 mg | ORAL_TABLET | Freq: Three times a day (TID) | ORAL | 0 refills | Status: DC | PRN

## 2019-02-17 NOTE — Telephone Encounter (Signed)
Pt recently nausea and vomiting. Has covid rx. I sent in zofran. Pt also has old rx of hydrocodone based syrup.  Pt ha is better.

## 2019-02-17 NOTE — Telephone Encounter (Signed)
Pt found ol med hydromet for cough. This has hydrocodone in it. So does not need norco. Also talked with her today and HA is resolved presently.  Thanks for helping.

## 2019-02-17 NOTE — Telephone Encounter (Signed)
We don't have to send anything to the health department anymore

## 2019-02-21 ENCOUNTER — Emergency Department (HOSPITAL_BASED_OUTPATIENT_CLINIC_OR_DEPARTMENT_OTHER)
Admission: EM | Admit: 2019-02-21 | Discharge: 2019-02-21 | Disposition: A | Discharge status: Home / Self Care | Payer: BC Managed Care – PPO | Attending: Emergency Medicine | Admitting: Emergency Medicine

## 2019-02-21 ENCOUNTER — Emergency Department (HOSPITAL_BASED_OUTPATIENT_CLINIC_OR_DEPARTMENT_OTHER): Payer: BC Managed Care – PPO

## 2019-02-21 ENCOUNTER — Encounter (HOSPITAL_BASED_OUTPATIENT_CLINIC_OR_DEPARTMENT_OTHER): Payer: Self-pay | Admitting: Emergency Medicine

## 2019-02-21 ENCOUNTER — Other Ambulatory Visit: Payer: Self-pay

## 2019-02-21 DIAGNOSIS — R05 Cough: Secondary | ICD-10-CM | POA: Diagnosis not present

## 2019-02-21 DIAGNOSIS — M546 Pain in thoracic spine: Secondary | ICD-10-CM | POA: Diagnosis not present

## 2019-02-21 DIAGNOSIS — R0789 Other chest pain: Secondary | ICD-10-CM | POA: Diagnosis not present

## 2019-02-21 DIAGNOSIS — M7918 Myalgia, other site: Secondary | ICD-10-CM | POA: Insufficient documentation

## 2019-02-21 DIAGNOSIS — M549 Dorsalgia, unspecified: Secondary | ICD-10-CM | POA: Diagnosis not present

## 2019-02-21 DIAGNOSIS — R519 Headache, unspecified: Secondary | ICD-10-CM | POA: Diagnosis not present

## 2019-02-21 DIAGNOSIS — Z79899 Other long term (current) drug therapy: Secondary | ICD-10-CM | POA: Insufficient documentation

## 2019-02-21 DIAGNOSIS — I10 Essential (primary) hypertension: Secondary | ICD-10-CM | POA: Diagnosis not present

## 2019-02-21 DIAGNOSIS — U071 COVID-19: Secondary | ICD-10-CM | POA: Insufficient documentation

## 2019-02-21 DIAGNOSIS — R079 Chest pain, unspecified: Secondary | ICD-10-CM | POA: Diagnosis not present

## 2019-02-21 LAB — BASIC METABOLIC PANEL
Anion gap: 12 (ref 5–15)
BUN: 12 mg/dL (ref 6–20)
CO2: 25 mmol/L (ref 22–32)
Calcium: 9.6 mg/dL (ref 8.9–10.3)
Chloride: 100 mmol/L (ref 98–111)
Creatinine, Ser: 0.76 mg/dL (ref 0.44–1.00)
GFR calc Af Amer: >60 mL/min (ref >60)
GFR calc non Af Amer: >60 mL/min (ref >60)
Glucose, Bld: 113 mg/dL — ABNORMAL HIGH (ref 70–99)
Potassium: 2.9 mmol/L — ABNORMAL LOW (ref 3.5–5.1)
Sodium: 137 mmol/L (ref 135–145)

## 2019-02-21 LAB — CBC WITH DIFFERENTIAL/PLATELET
Abs Immature Granulocytes: 0.01 10*3/uL (ref 0.00–0.07)
Basophils Absolute: 0 10*3/uL (ref 0.0–0.1)
Basophils Relative: 0 %
Eosinophils Absolute: 0 10*3/uL (ref 0.0–0.5)
Eosinophils Relative: 0 %
HCT: 49.4 % — ABNORMAL HIGH (ref 36.0–46.0)
Hemoglobin: 16 g/dL — ABNORMAL HIGH (ref 12.0–15.0)
Immature Granulocytes: 0 %
Lymphocytes Relative: 17 %
Lymphs Abs: 1.2 10*3/uL (ref 0.7–4.0)
MCH: 28.2 pg (ref 26.0–34.0)
MCHC: 32.4 g/dL (ref 30.0–36.0)
MCV: 87.1 fL (ref 80.0–100.0)
Monocytes Absolute: 0.7 10*3/uL (ref 0.1–1.0)
Monocytes Relative: 10 %
Neutro Abs: 5 10*3/uL (ref 1.7–7.7)
Neutrophils Relative %: 73 %
Platelets: 221 10*3/uL (ref 150–400)
RBC: 5.67 MIL/uL — ABNORMAL HIGH (ref 3.87–5.11)
RDW: 12.4 % (ref 11.5–15.5)
WBC Morphology: ABNORMAL
WBC: 6.9 10*3/uL (ref 4.0–10.5)
nRBC: 0 % (ref 0.0–0.2)

## 2019-02-21 LAB — URINALYSIS, ROUTINE W REFLEX MICROSCOPIC
Glucose, UA: NEGATIVE mg/dL
Ketones, ur: 15 mg/dL — AB
Leukocytes,Ua: NEGATIVE
Nitrite: NEGATIVE
Protein, ur: NEGATIVE mg/dL
Specific Gravity, Urine: 1.025 (ref 1.005–1.030)
pH: 6 (ref 5.0–8.0)

## 2019-02-21 LAB — URINALYSIS, MICROSCOPIC (REFLEX)

## 2019-02-21 LAB — PREGNANCY, URINE: Preg Test, Ur: NEGATIVE

## 2019-02-21 MED ORDER — SODIUM CHLORIDE 0.9 % IV BOLUS
500.0000 mL | Freq: Once | INTRAVENOUS | Status: AC
  Administered 2019-02-21: 500 mL via INTRAVENOUS

## 2019-02-21 MED ORDER — POTASSIUM CHLORIDE CRYS ER 20 MEQ PO TBCR
40.0000 meq | EXTENDED_RELEASE_TABLET | Freq: Once | ORAL | Status: AC
  Administered 2019-02-21: 40 meq via ORAL
  Filled 2019-02-21: qty 2

## 2019-02-21 MED ORDER — HYDROCODONE-ACETAMINOPHEN 5-325 MG PO TABS
1.0000 | ORAL_TABLET | Freq: Once | ORAL | Status: AC
  Administered 2019-02-21: 1 via ORAL
  Filled 2019-02-21: qty 1

## 2019-02-21 MED ORDER — HYDROCODONE-ACETAMINOPHEN 5-325 MG PO TABS: 1.0000 | ORAL_TABLET | Freq: Four times a day (QID) | ORAL | 0 refills | Status: DC | PRN

## 2019-02-21 NOTE — ED Notes (Signed)
Pt upset in room, speaking with family on phone. BP is elevated pt reports she has not taken her BP medication this morning. Melina Copa MD - EDP made aware. Will continue to monitor pt BP>

## 2019-02-21 NOTE — Discharge Instructions (Signed)
You were seen in the emergency department for some left-sided mid back pain.  You had a chest x-ray blood work and urinalysis that did not show an obvious cause of this pain.  Your potassium was mildly low and this was repleted.  You also were dehydrated and were given some IV fluids.  You should continue to isolate and monitor your symptoms.  Please stay well-hydrated.  Return to the emergency department if any worsening symptoms.

## 2019-02-21 NOTE — ED Provider Notes (Signed)
[facility] DEPART[facility] Provider Note   CSN: [id number] Arrival date & time: 02/21/19  [id number]     History   Chief Complaint Chief Complaint  Patient presents with  . Back Pain    HPI Debra Ayers is a 48 y.o. female.  She was diagnosed with Covid about 10 days ago.  She is complaining of worsening overall symptoms with body aches headache cough fevers diarrhea.  She said for the last few days she has had a pain in the left posterior lower chest that is keeping her from sleeping.  It is worse with cough and movement.     The history is provided by the patient.  Flank Pain This is a new problem. The current episode started more than 2 days ago. The problem occurs constantly. The problem has not changed since onset.Associated symptoms include headaches and shortness of breath. Pertinent negatives include no chest pain and no abdominal pain. The symptoms are aggravated by bending, twisting and coughing. Nothing relieves the symptoms. She has tried acetaminophen for the symptoms. The treatment provided no relief.    Past Medical History:  Diagnosis Date  . Anemia   . Heart murmur   . Hypertension    medication  . Uterine fibroid     Patient Active Problem List   Diagnosis Date Noted  . Right elbow pain 11/29/2015  . Visit for preventive health examination 09/10/2015  . Chronic idiopathic constipation 08/12/2014  . Family history of diabetes mellitus 06/25/2013  . Umbilical hernia, incarcerated 11/20/2012  . Herpetic whitlow 02/12/2012  . HTN (hypertension) 02/12/2012  . Hearing problem 02/12/2012    Past Surgical History:  Procedure Laterality Date  . BACK SURGERY  2004   Benign fatty tumor removed from mid upper back  . BREAST ENHANCEMENT SURGERY  2010  . BREAST IMPLANT REMOVAL  2013  . BREAST SURGERY  2014   Left breast biopsy, benign  . TUBAL LIGATION  2001  . UMBILICAL HERNIA REPAIR  2018     OB History    Gravida  2   Para  2   Term      Preterm      AB      Living  2     SAB      TAB      Ectopic      Multiple      Live Births               Home Medications    Prior to Admission medications   Medication Sig Start Date End Date Taking? Authorizing Provider  amLODipine (NORVASC) 10 MG tablet Take 1 tablet (10 mg total) by mouth daily. 02/11/19 03/13/19  Long, Wonda Olds, MD  amoxicillin-clavulanate (AUGMENTIN) 875-125 MG tablet Take 1 tablet by mouth 2 (two) times daily. 02/12/19   Saguier, Percell Miller, PA-C  azelastine (ASTELIN) 0.1 % nasal spray Place 2 sprays into both nostrils 2 (two) times daily. Use in each nostril as directed 09/09/18   Saguier, Percell Miller, PA-C  azithromycin (ZITHROMAX) 250 MG tablet Take 2 tablets by mouth on day 1, followed by 1 tablet by mouth daily for 4 days. 02/13/19   Saguier, Percell Miller, PA-C  fluticasone (FLONASE) 50 MCG/ACT nasal spray Place 2 sprays into both nostrils daily. 08/29/18   Saguier, Percell Miller, PA-C  fluticasone (FLONASE) 50 MCG/ACT nasal spray Place 2 sprays into both nostrils daily. 02/12/19   Saguier, Percell Miller, PA-C  guaiFENesin (MUCINEX) 600 MG 12 hr tablet Take 1  tablet (600 mg total) by mouth 2 (two) times daily as needed for cough or to loosen phlegm. 05/21/18   Nche, Charlene Brooke, NP  HYDROcodone-acetaminophen [facility]) 5-325 MG tablet 1 tab po q 6 hours prn headache 02/12/19   Saguier, Percell Miller, PA-C  HYDROcodone-homatropine [facility]) 5-1.5 MG/5ML syrup Take 5 mLs by mouth every 6 (six) hours as needed. 09/09/18   Saguier, Percell Miller, PA-C  levocetirizine (XYZAL) 5 MG tablet TAKE 1 TABLET(5 MG) BY MOUTH EVERY EVENING 10/08/18   Saguier, Percell Miller, PA-C  losartan (COZAAR) 100 MG tablet Take 1 tablet (100 mg total) by mouth daily. 02/12/19   Saguier, Percell Miller, PA-C  losartan-hydrochlorothiazide (HYZAAR) 100-25 MG tablet Take 1 tablet by mouth daily. 12/07/16   Saguier, Percell Miller, PA-C  ondansetron (ZOFRAN ODT) 8 MG disintegrating tablet Take 1 tablet (8 mg total) by mouth every 8 (eight)  hours as needed for nausea or vomiting. 02/17/19   Saguier, Percell Miller, PA-C  oseltamivir (TAMIFLU) 75 MG capsule Take 1 capsule (75 mg total) by mouth 2 (two) times daily. 05/21/18   Nche, Charlene Brooke, NP    Family History Family History  Problem Relation Age of Onset  . Arthritis Mother   . Osteoporosis Mother   . Diabetes Mother   . Diabetes Maternal Grandmother   . Diabetes Son   . Diabetes Maternal Aunt   . Diabetes Maternal Uncle     Social History Social History   Tobacco Use  . Smoking status: Never Smoker  . Smokeless tobacco: Never Used  Substance Use Topics  . Alcohol use: Yes    Alcohol/week: 3.0 standard drinks    Types: 3 Cans of beer per week  . Drug use: No     Allergies   Patient has no known allergies.   Review of Systems Review of Systems  Constitutional: Positive for fatigue and fever.  HENT: Negative for sore throat.   Eyes: Negative for visual disturbance.  Respiratory: Positive for cough and shortness of breath.   Cardiovascular: Negative for chest pain.  Gastrointestinal: Positive for diarrhea. Negative for abdominal pain.  Genitourinary: Positive for flank pain. Negative for dysuria.  Musculoskeletal: Positive for myalgias.  Skin: Negative for rash.  Neurological: Positive for headaches.     Physical Exam Updated Vital Signs BP (!) 142/94   Pulse 76   Temp 98.4 F (36.9 C) (Oral)   Resp 11   Ht 5\' 2"  (1.575 m)   Wt 75.8 kg   SpO2 96%   BMI 30.54 kg/m   Physical Exam Vitals signs and nursing note reviewed.  Constitutional:      General: She is not in acute distress.    Appearance: She is well-developed.  HENT:     Head: Normocephalic and atraumatic.  Eyes:     Conjunctiva/sclera: Conjunctivae normal.  Neck:     Musculoskeletal: Neck supple.  Cardiovascular:     Rate and Rhythm: Normal rate and regular rhythm.     Heart sounds: No murmur.  Pulmonary:     Effort: Pulmonary effort is normal. No respiratory distress.      Breath sounds: Normal breath sounds.  Abdominal:     Palpations: Abdomen is soft.     Tenderness: There is no abdominal tenderness.  Musculoskeletal: Normal range of motion.        General: No deformity or signs of injury.       Arms:  Skin:    General: Skin is warm and dry.     Capillary Refill: Capillary refill takes  less than 2 seconds.  Neurological:     General: No focal deficit present.     Mental Status: She is alert.      ED Treatments / Results  Labs (all labs ordered are listed, but only abnormal results are displayed) Labs Reviewed  BASIC METABOLIC PANEL - Abnormal; Notable for the following components:      Result Value   Potassium 2.9 (*)    Glucose, Bld 113 (*)    All other components within normal limits  CBC WITH DIFFERENTIAL/PLATELET - Abnormal; Notable for the following components:   RBC 5.67 (*)    Hemoglobin 16.0 (*)    HCT 49.4 (*)    All other components within normal limits  URINALYSIS, ROUTINE W REFLEX MICROSCOPIC - Abnormal; Notable for the following components:   Color, Urine AMBER (*)    APPearance HAZY (*)    Hgb urine dipstick TRACE (*)    Bilirubin Urine SMALL (*)    Ketones, ur 15 (*)    All other components within normal limits  URINALYSIS, MICROSCOPIC (REFLEX) - Abnormal; Notable for the following components:   Bacteria, UA MANY (*)    All other components within normal limits  URINE CULTURE  PREGNANCY, URINE    EKG None  Radiology Dg Chest Port 1 View  Result Date: 02/21/2019 CLINICAL DATA:  Left-sided chest pain posterior left rib area worse with inspiration. COVID-19 positive 10 days ago. EXAM: PORTABLE CHEST 1 VIEW COMPARISON:  None. FINDINGS: Lungs are adequately inflated and otherwise clear. Subtle linear density over the left base likely atelectasis/scarring. Cardiomediastinal silhouette and remainder of the exam is unremarkable. IMPRESSION: No acute cardiopulmonary disease. Subtle linear density left base likely  atelectasis/scarring. Electronically Signed   By: Marin Olp M.D.   On: 02/21/2019 07:44    Procedures Procedures (including critical care time)  Medications Ordered in ED Medications  HYDROcodone-acetaminophen (NORCO/VICODIN) 5-325 MG per tablet 1 tablet (1 tablet Oral Given 02/21/19 0808)  potassium chloride SA (KLOR-CON) CR tablet 40 mEq (40 mEq Oral Given 02/21/19 0842)  sodium chloride 0.9 % bolus 500 mL (0 mLs Intravenous Stopped 02/21/19 1028)     Initial Impression / Assessment and Plan / ED Course  I have reviewed the triage vital signs and the nursing notes.  Pertinent labs & imaging results that were available during my care of the patient were reviewed by me and considered in my medical decision making (see chart for details).  Clinical Course as of Feb 21 1308  Fri Feb 21, 4035  4441 49 year old female recent Covid diagnosis here with left flank/posterior chest pain.  Likely musculoskeletal although with diagnosis of Covid could have pneumonia, PE.  She is PERC negative.  Checking chest x-ray some basic labs.  Urinalysis.   [MB]  [id number] Chest x-ray reviewed by me and interpreted as no acute disease.   [MB]  0848 Patient's blood pressure elevated here.  She said she did not take her medication this morning.   [MB]  0900 I reviewed the patient's labs with her.  She is agreeable to get a liter of fluid.  We will culture her urine although she has no urinary symptoms so doubt this is infection.   [MB]    Clinical Course User Index [MB] Hayden Rasmussen, MD   Unknown Jim was evaluated in Emergency Department on 02/21/2019 for the symptoms described in the history of present illness. She was evaluated in the context of the global COVID-19 pandemic, which necessitated consideration  that the patient might be at risk for infection with the SARS-CoV-2 virus that causes COVID-19. Institutional protocols and algorithms that pertain to the evaluation of patients at risk for  COVID-19 are in a state of rapid change based on information released by regulatory bodies including the CDC and federal and state organizations. These policies and algorithms were followed during the patient's care in the ED.       Final Clinical Impressions(s) / ED Diagnoses   Final diagnoses:  Acute left-sided thoracic back pain  COVID-19 virus infection    ED Discharge Orders    None       Hayden Rasmussen, MD 02/21/19 1310

## 2019-02-21 NOTE — ED Triage Notes (Signed)
Pt presents for Pain in left posterior rib area.  Worse with deep breath.  Pt sts she cannot sleep d/t the pain.  Tested positive for Covid 10 days ago. Husband tested positive 6 days ago.

## 2019-02-22 LAB — URINE CULTURE

## 2019-02-24 ENCOUNTER — Other Ambulatory Visit: Payer: Self-pay

## 2019-02-24 ENCOUNTER — Encounter: Payer: Self-pay | Admitting: Medical

## 2019-02-24 ENCOUNTER — Ambulatory Visit (INDEPENDENT_AMBULATORY_CARE_PROVIDER_SITE_OTHER): Payer: BC Managed Care – PPO | Admitting: Medical

## 2019-02-24 VITALS — BP 143/93 | Temp 98.5°F | Ht 62.0 in | Wt 154.0 lb

## 2019-02-24 DIAGNOSIS — Z8619 Personal history of other infectious and parasitic diseases: Secondary | ICD-10-CM | POA: Diagnosis not present

## 2019-02-24 DIAGNOSIS — Z8616 Personal history of COVID-19: Secondary | ICD-10-CM

## 2019-02-24 NOTE — Patient Instructions (Addendum)
You are overall doing better and approaching 17 days post + test for covid. Only residual intermittent dry cough post viral infection. Will rx hydromet and advise to use every 6 hours to prevent cough particularly at work. Wear mask as well. Tried to send in hydromet various times. imprivata not working. Will need to try to send it later when app working?  If worsen let me know if signs/symptoms worsen again or fever. If so would extend work note.  Follow up as needed.  Sending her return to work note to my chart account.

## 2019-02-24 NOTE — Progress Notes (Signed)
Subjective:    Patient ID: Unknown Debra Ayers, female    DOB: January 28, 1971, 48 y.o.   MRN: [id number]  HPI  Virtual Visit via Video Note  I connected with Unknown Debra Ayers on 02/24/19 at  3:20 PM EST by a video enabled telemedicine application and verified that I am speaking with the correct person using two identifiers.  Location: Patient: home Provider: office   I discussed the limitations of evaluation and management by telemedicine and the availability of in person appointments. The patient expressed understanding and agreed to proceed.  History of Present Illness:  Pt  In for follow up for recent covid infection. Pt husband has covid and he is admitted to hospital. Pt husband in ICU.   She states on Friday he got fever and short of breath.  Pt was diagnosed 13 days ago. Overall she feels better but still has random cough.   Pt did go to ED for back pain and she was found to be dehydrated. No pneumonia and low k. Pt has dry cough early morning and late afternoon.  She states no fever, appetite getting normal, and has mild fatigue. No nausea, no vomiting or diarrhea.  Pt in past uses hydromet for cough.She has old rx for 08/30/2018. Has only been using at night.  No fever for 4 days.    Pt wants to return to work on 02/27/2019.  This will be 17 days post + test. Pt was not hospitalized.    Observations/Objective:   General-no acute distress, pleasant, oriented. Lungs- on inspection lungs appear unlabored. Neck- no tracheal deviation or jvd on inspection. Neuro- gross motor function appears intact.  Assessment and Plan: You are overall doing better and approaching 17 days post + test for covid. Only residual intermittent dry cough post viral infection. Will rx hydromet and advise to use every 6 hours to prevent cough particularly at work. Wear mask as well. Tried to send in hydromet various times. imprivata not working.  If worsen let me know if signs/symptoms worsen  again or fever. If so would extend work note.  Follow up as needed.  Sending her return to work note to my chart account.  Follow Up Instructions:    I discussed the assessment and treatment plan with the patient. The patient was provided an opportunity to ask questions and all were answered. The patient agreed with the plan and demonstrated an understanding of the instructions.   The patient was advised to call back or seek an in-person evaluation if the symptoms worsen or if the condition fails to improve as anticipated.  I provided 25 minutes of non-face-to-face time during this encounter.   Mackie Pai, PA-C   Review of Systems  Constitutional: Negative for chills, fatigue and fever.  HENT: Negative for congestion, facial swelling, hearing loss, nosebleeds, postnasal drip, sinus pressure and sinus pain.   Respiratory: Positive for cough. Negative for chest tightness, shortness of breath and wheezing.        Residual dry cough/  Cardiovascular: Negative for chest pain and palpitations.  Gastrointestinal: Negative for abdominal pain.  Genitourinary: Negative for dysuria and frequency.  Musculoskeletal: Negative for back pain and joint swelling.  Skin: Negative for rash.  Neurological: Negative for dizziness, seizures, weakness and headaches.  Hematological: Negative for adenopathy. Does not bruise/bleed easily.  Psychiatric/Behavioral: Negative for behavioral problems and confusion.       Objective:   Physical Exam        Assessment & Plan:

## 2019-02-25 ENCOUNTER — Telehealth: Payer: Self-pay

## 2019-02-25 MED ORDER — HYDROCODONE-HOMATROPINE 5-1.5 MG/5ML PO SYRP: 5.0000 mL | ORAL_SOLUTION | Freq: Four times a day (QID) | ORAL | 0 refills | Status: DC | PRN

## 2019-02-25 NOTE — Telephone Encounter (Signed)
I sent her return to work note to her my chart. She should be able to see that and print it off for work.

## 2019-02-25 NOTE — Telephone Encounter (Signed)
Copied from [place] [id number]. Topic: General - Inquiry >> Feb 25, 2019  9:32 AM Percell Belt A wrote: Reason for CRM: pt called in stated that provider was suppose to send a note her pt mychart that she could return to work with not restrictions Best number [phone]

## 2019-02-25 NOTE — Addendum Note (Signed)
Addended by: Anabel Halon on: 02/25/2019 03:19 PM   Modules accepted: Orders

## 2019-03-07 ENCOUNTER — Telehealth: Payer: Self-pay | Admitting: Medical

## 2019-03-07 NOTE — Telephone Encounter (Signed)
I did call Debra Ayers today to express condolences on her husband passing. Ask her to schedule appointment if needed as she grieves over passing.

## 2019-03-11 ENCOUNTER — Telehealth: Payer: Self-pay | Admitting: Medical

## 2019-03-11 NOTE — Telephone Encounter (Signed)
Pt scheduled tomorrow to fill out paperwork

## 2019-03-11 NOTE — Telephone Encounter (Signed)
Pt dropped off document to be filled out by provider ([place] -5 pages) Pt would like document faxed when ready to fax number [phone]. Document put at front office tray under providers name.

## 2019-03-12 ENCOUNTER — Other Ambulatory Visit: Payer: Self-pay

## 2019-03-12 ENCOUNTER — Ambulatory Visit (INDEPENDENT_AMBULATORY_CARE_PROVIDER_SITE_OTHER): Payer: BC Managed Care – PPO | Admitting: Medical

## 2019-03-12 DIAGNOSIS — F329 Major depressive disorder, single episode, unspecified: Secondary | ICD-10-CM | POA: Diagnosis not present

## 2019-03-12 DIAGNOSIS — Z634 Disappearance and death of family member: Secondary | ICD-10-CM | POA: Diagnosis not present

## 2019-03-12 DIAGNOSIS — F419 Anxiety disorder, unspecified: Secondary | ICD-10-CM

## 2019-03-12 DIAGNOSIS — F32A Depression, unspecified: Secondary | ICD-10-CM

## 2019-03-12 MED ORDER — TRAZODONE HCL 50 MG PO TABS: 25.0000 mg | ORAL_TABLET | Freq: Every evening | ORAL | 3 refills | Status: DC | PRN

## 2019-03-12 MED ORDER — CLONAZEPAM 0.5 MG PO TABS: 0.5000 mg | ORAL_TABLET | Freq: Two times a day (BID) | ORAL | 0 refills | Status: DC | PRN

## 2019-03-12 NOTE — Patient Instructions (Signed)
For anxiety, depression and insomnia since her husband passed away, I prescribed trazodone and clonazepam. Rx advisement given. Will see how she does and might add ssri.   Asked pt to give me my chart update in 2-3 weeks as to how she is doing.  Pt waiting to see how she does emotionally as time progresses. Then might decide to do counseling thru her work.  Filled out pt [employer] form for covid absence.  Follow up date to be determined after my chart update

## 2019-03-12 NOTE — Progress Notes (Signed)
Subjective:    Patient ID: Debra Ayers, female    DOB: [birth year], 48 y.o.   MRN: [id number]  HPI     Virtual Visit via Telephone Note  I connected with Debra Ayers on 03/12/19 at  1:40 PM EST by telephone and verified that I am speaking with the correct person using two identifiers.  Location: Patient: home Provider: office      I discussed the assessment and treatment plan with the patient. The patient was provided an opportunity to ask questions and all were answered. The patient agreed with the plan and demonstrated an understanding of the instructions.   The patient was advised to call back or seek an in-person evaluation if the symptoms worsen or if the condition fails to improve as anticipated.  I provided 25 minutes of non-face-to-face time during this encounter.   Mackie Pai, PA-C    Pt in for follow up.  Pt states she is depressed since her husband passed. She states hard to stay asleep. Pt has used trazadone in past briefly and it did help with insomnia. Feeling some anxious at times.  Pt did go back to work after covid dx. Went back to work on nov  5th. Pt started her leave on oct 20 th. Pt returned back to work briefly before he husband got sick then passed.  Pt planning to go back to work Monday when her bereavement ends.  Pt needs paperwork filled.  Some anxiety at times.  Pt does have her kids with her. Son with her recently.  Pt work provided her with counselor number.     Review of Systems  Constitutional: Negative for chills, fatigue and fever.  HENT: Negative for congestion, drooling, nosebleeds, sinus pressure, sinus pain and sneezing.   Respiratory: Negative for cough, chest tightness, shortness of breath and wheezing.   Cardiovascular: Negative for chest pain and palpitations.  Gastrointestinal: Negative for abdominal pain.  Musculoskeletal: Negative for back pain and myalgias.  Skin: Negative for rash.  Neurological:  Negative for dizziness, light-headedness and numbness.  Hematological: Negative for adenopathy. Does not bruise/bleed easily.  Psychiatric/Behavioral: Negative for behavioral problems and confusion. The patient is nervous/anxious.     Past Medical History:  Diagnosis Date  . Anemia   . Heart murmur   . Hypertension    medication  . Uterine fibroid      Social History   Socioeconomic History  . Marital status: Legally Separated    Spouse name: Not on file  . Number of children: 2  . Years of education: Not on file  . Highest education level: Not on file  Occupational History    Employer: [employer]  Social Needs  . Financial resource strain: Not on file  . Food insecurity    Worry: Not on file    Inability: Not on file  . Transportation needs    Medical: Not on file    Non-medical: Not on file  Tobacco Use  . Smoking status: Never Smoker  . Smokeless tobacco: Never Used  Substance and Sexual Activity  . Alcohol use: Yes    Alcohol/week: 3.0 standard drinks    Types: 3 Cans of beer per week  . Drug use: No  . Sexual activity: Yes    Birth control/protection: I.U.D.  Lifestyle  . Physical activity    Days per week: Not on file    Minutes per session: Not on file  . Stress: Not on file  Relationships  .  Social Herbalist on phone: Not on file    Gets together: Not on file    Attends religious service: Not on file    Active member of club or organization: Not on file    Attends meetings of clubs or organizations: Not on file    Relationship status: Not on file  . Intimate partner violence    Fear of current or ex partner: Not on file    Emotionally abused: Not on file    Physically abused: Not on file    Forced sexual activity: Not on file  Other Topics Concern  . Not on file  Social History Narrative   Has 47 yr old and 47 year old   Works as Dance movement psychotherapist at [employer]   Separated   She completed 2 years college   Enjoys entertaining friends     Past Surgical History:  Procedure Laterality Date  . BACK SURGERY  2004   Benign fatty tumor removed from mid upper back  . BREAST ENHANCEMENT SURGERY  2010  . BREAST IMPLANT REMOVAL  2013  . BREAST SURGERY  2014   Left breast biopsy, benign  . TUBAL LIGATION  2001  . UMBILICAL HERNIA REPAIR  2018    Family History  Problem Relation Age of Onset  . Arthritis Mother   . Osteoporosis Mother   . Diabetes Mother   . Diabetes Maternal Grandmother   . Diabetes Son   . Diabetes Maternal Aunt   . Diabetes Maternal Uncle     No Known Allergies  Current Outpatient Medications on File Prior to Visit  Medication Sig Dispense Refill  . amLODipine (NORVASC) 10 MG tablet Take 1 tablet (10 mg total) by mouth daily. 30 tablet 0  . amoxicillin-clavulanate (AUGMENTIN) 875-125 MG tablet Take 1 tablet by mouth 2 (two) times daily. 20 tablet 0  . azelastine (ASTELIN) 0.1 % nasal spray Place 2 sprays into both nostrils 2 (two) times daily. Use in each nostril as directed 30 mL 0  . azithromycin (ZITHROMAX) 250 MG tablet Take 2 tablets by mouth on day 1, followed by 1 tablet by mouth daily for 4 days. 6 tablet 0  . fluticasone (FLONASE) 50 MCG/ACT nasal spray Place 2 sprays into both nostrils daily. 16 g 1  . fluticasone (FLONASE) 50 MCG/ACT nasal spray Place 2 sprays into both nostrils daily. 16 g 1  . guaiFENesin (MUCINEX) 600 MG 12 hr tablet Take 1 tablet (600 mg total) by mouth 2 (two) times daily as needed for cough or to loosen phlegm. 14 tablet 0  . HYDROcodone-homatropine (HYCODAN) 5-1.5 MG/5ML syrup Take 5 mLs by mouth every 6 (six) hours as needed. 100 mL 0  . levocetirizine (XYZAL) 5 MG tablet TAKE 1 TABLET(5 MG) BY MOUTH EVERY EVENING 30 tablet 0  . losartan (COZAAR) 100 MG tablet Take 1 tablet (100 mg total) by mouth daily. 30 tablet 3  . losartan-hydrochlorothiazide (HYZAAR) 100-25 MG tablet Take 1 tablet by mouth daily. 30 tablet 11  . ondansetron (ZOFRAN ODT) 8 MG disintegrating  tablet Take 1 tablet (8 mg total) by mouth every 8 (eight) hours as needed for nausea or vomiting. 20 tablet 0  . oseltamivir (TAMIFLU) 75 MG capsule Take 1 capsule (75 mg total) by mouth 2 (two) times daily. 10 capsule 0   No current facility-administered medications on file prior to visit.     There were no vitals taken for this visit.  Objective:   Physical Exam  General Mental Status- Alert. General Appearance- Not in acute distress.   Skin General: Color- Normal Color. Moisture- Normal Moisture.  Neck Carotid Arteries- Normal color. Moisture- Normal Moisture. No carotid bruits. No JVD.  Chest and Lung Exam Auscultation: Breath Sounds:-Normal.  Cardiovascular Auscultation:Rythm- Regular. Murmurs & Other Heart Sounds:Auscultation of the heart reveals- No Murmurs.  Abdomen Inspection:-Inspeection Normal. Palpation/Percussion:Note:No mass. Palpation and Percussion of the abdomen reveal- Non Tender, Non Distended + BS, no rebound or guarding.   Neurologic Cranial Nerve exam:- CN III-XII intact(No nystagmus), symmetric smile. Strength:- 5/5 equal and symmetric strength both upper and lower extremities.      Assessment & Plan:  For anxiety, depression and insomnia since her husband passed away, I prescribed trazodone and clonazepam. Rx advisement given. Will see how she does and might add ssri.   Asked pt to give me my chart update in 2-3 weeks as to how she is doing.  Pt waiting to see how she does emotionally as time progresses. Then might decide to do counseling thru her work.  Filled out pt [employer] form for covid absence.  Follow up date to be determined after my chart update

## 2019-04-13 ENCOUNTER — Other Ambulatory Visit: Payer: Self-pay | Admitting: Medical

## 2019-05-14 DIAGNOSIS — Z1231 Encounter for screening mammogram for malignant neoplasm of breast: Secondary | ICD-10-CM | POA: Diagnosis not present

## 2019-05-14 DIAGNOSIS — Z01419 Encounter for gynecological examination (general) (routine) without abnormal findings: Secondary | ICD-10-CM | POA: Diagnosis not present

## 2019-05-14 DIAGNOSIS — Z6829 Body mass index (BMI) 29.0-29.9, adult: Secondary | ICD-10-CM | POA: Diagnosis not present

## 2019-05-16 ENCOUNTER — Other Ambulatory Visit: Payer: Self-pay | Admitting: Obstetrics and Gynecology

## 2019-05-16 DIAGNOSIS — R928 Other abnormal and inconclusive findings on diagnostic imaging of breast: Secondary | ICD-10-CM

## 2019-05-23 ENCOUNTER — Ambulatory Visit
Admission: RE | Admit: 2019-05-23 | Discharge: 2019-05-23 | Disposition: A | Discharge status: Home / Self Care | Payer: BC Managed Care – PPO | Source: Ambulatory Visit | Attending: Obstetrics and Gynecology | Admitting: Obstetrics and Gynecology

## 2019-05-23 ENCOUNTER — Other Ambulatory Visit: Payer: Self-pay

## 2019-05-23 ENCOUNTER — Other Ambulatory Visit: Payer: Self-pay | Admitting: Obstetrics and Gynecology

## 2019-05-23 DIAGNOSIS — R928 Other abnormal and inconclusive findings on diagnostic imaging of breast: Secondary | ICD-10-CM

## 2019-05-23 DIAGNOSIS — R922 Inconclusive mammogram: Secondary | ICD-10-CM | POA: Diagnosis not present

## 2019-05-23 DIAGNOSIS — N6322 Unspecified lump in the left breast, upper inner quadrant: Secondary | ICD-10-CM | POA: Diagnosis not present

## 2019-05-23 DIAGNOSIS — N63 Unspecified lump in unspecified breast: Secondary | ICD-10-CM

## 2019-06-02 ENCOUNTER — Ambulatory Visit
Admission: RE | Admit: 2019-06-02 | Discharge: 2019-06-02 | Disposition: A | Discharge status: Home / Self Care | Payer: BC Managed Care – PPO | Source: Ambulatory Visit | Attending: Obstetrics and Gynecology | Admitting: Obstetrics and Gynecology

## 2019-06-02 ENCOUNTER — Other Ambulatory Visit: Payer: Self-pay

## 2019-06-02 DIAGNOSIS — N63 Unspecified lump in unspecified breast: Secondary | ICD-10-CM

## 2019-06-02 DIAGNOSIS — D259 Leiomyoma of uterus, unspecified: Secondary | ICD-10-CM | POA: Diagnosis not present

## 2019-06-02 DIAGNOSIS — Z6830 Body mass index (BMI) 30.0-30.9, adult: Secondary | ICD-10-CM | POA: Diagnosis not present

## 2019-06-02 DIAGNOSIS — N6322 Unspecified lump in the left breast, upper inner quadrant: Secondary | ICD-10-CM | POA: Diagnosis not present

## 2019-06-02 DIAGNOSIS — D242 Benign neoplasm of left breast: Secondary | ICD-10-CM | POA: Diagnosis not present

## 2019-06-11 DIAGNOSIS — D242 Benign neoplasm of left breast: Secondary | ICD-10-CM | POA: Insufficient documentation

## 2019-07-03 ENCOUNTER — Telehealth: Payer: Self-pay | Admitting: Medical

## 2019-07-03 MED ORDER — LOSARTAN POTASSIUM 100 MG PO TABS: 100.0000 mg | ORAL_TABLET | Freq: Every day | ORAL | 3 refills | Status: DC

## 2019-07-03 NOTE — Telephone Encounter (Signed)
Medication:losartan (COZAAR) 100 MG tablet [id number]   Has the patient contacted their pharmacy? No. (If no, request that the patient contact the pharmacy for the refill.) (If yes, when and what did the pharmacy advise?)  Preferred Pharmacy (with phone number or street name):   [id number] [address], [address]  [place], [place] [address]  Phone:  [phone] Fax:  [phone]   Agent: Please be advised that RX refills may take up to 3 business days. We ask that you follow-up with your pharmacy.

## 2019-07-23 ENCOUNTER — Other Ambulatory Visit: Payer: Self-pay

## 2019-07-23 MED ORDER — LOSARTAN POTASSIUM 100 MG PO TABS: 100.0000 mg | ORAL_TABLET | Freq: Every day | ORAL | 3 refills | Status: DC

## 2019-07-27 ENCOUNTER — Other Ambulatory Visit: Payer: Self-pay | Admitting: Medical

## 2019-09-29 ENCOUNTER — Other Ambulatory Visit: Payer: Self-pay | Admitting: Medical

## 2019-11-05 ENCOUNTER — Telehealth: Payer: Self-pay | Admitting: Medical

## 2019-11-05 NOTE — Telephone Encounter (Signed)
Can switch provider.

## 2019-11-05 NOTE — Telephone Encounter (Signed)
Patient is calling and wanted to [facility] from [employer] to [employer] Informed patient that Baldo Ash is out of the office and request may or may not be answered until she returns, please advise. CB is [phone]

## 2019-11-10 ENCOUNTER — Other Ambulatory Visit: Payer: Self-pay

## 2019-11-10 ENCOUNTER — Ambulatory Visit (INDEPENDENT_AMBULATORY_CARE_PROVIDER_SITE_OTHER): Payer: BC Managed Care – PPO | Admitting: Medical

## 2019-11-10 VITALS — BP 140/88 | HR 60 | Temp 98.0°F | Resp 18 | Ht 62.0 in | Wt 162.0 lb

## 2019-11-10 DIAGNOSIS — E663 Overweight: Secondary | ICD-10-CM

## 2019-11-10 DIAGNOSIS — I10 Essential (primary) hypertension: Secondary | ICD-10-CM | POA: Diagnosis not present

## 2019-11-10 DIAGNOSIS — R5383 Other fatigue: Secondary | ICD-10-CM | POA: Diagnosis not present

## 2019-11-10 NOTE — Telephone Encounter (Signed)
LVM for patient to call back to set up [facility] appointment with [facility].

## 2019-11-10 NOTE — Patient Instructions (Addendum)
Your bp is mild borderline today. Recommend continue current medication/bp regimen. If bp is increasing then need to adjust medications.  You desire to loose weight. I think Weight Watchers App is reasonable or try optavia. I would hold off on plastic surgery due to risk.  Will get cmp and lipid panel. Future labs.  If faint coccyx pain persists let me know in 5 days and get can get xray.  Follow 2-3 weeks or as needed

## 2019-11-10 NOTE — Telephone Encounter (Signed)
ok 

## 2019-11-10 NOTE — Progress Notes (Signed)
Subjective:    Patient ID: Unknown Debra Ayers, female    DOB: Jan 19, 1971, 49 y.o.   MRN: [id number]  HPI  Pt in for follow up.  She want to lose weight. Pt is considering plastic surgery. Was thinking of doing this in [place]. After explaining my concerns about plastic surgery pt statesd willing to try other methods rather than surgery.   Pt bp is borderline. Pt is on losartan and amlodipine.   Fall on buttox 2 days ago. Mild residual pain. Lower coccyx area mild tender per pt.    Review of Systems  Constitutional: Positive for fatigue. Negative for chills and fever.       Wt gain.  HENT: Negative for congestion, drooling and hearing loss.   Respiratory: Negative for cough, chest tightness, shortness of breath and wheezing.   Cardiovascular: Negative for chest pain and palpitations.  Gastrointestinal: Negative for abdominal pain.  Musculoskeletal: Negative for back pain, joint swelling, myalgias and neck stiffness.  Skin: Negative for rash.  Neurological: Negative for dizziness, weakness and headaches.  Hematological: Negative for adenopathy. Does not bruise/bleed easily.  Psychiatric/Behavioral: Negative for behavioral problems, decreased concentration and suicidal ideas.    Past Medical History:  Diagnosis Date  . Anemia   . Heart murmur   . Hypertension    medication  . Uterine fibroid      Social History   Socioeconomic History  . Marital status: Widowed    Spouse name: Not on file  . Number of children: 2  . Years of education: Not on file  . Highest education level: Not on file  Occupational History    Employer: [employer]  Tobacco Use  . Smoking status: Never Smoker  . Smokeless tobacco: Never Used  Substance and Sexual Activity  . Alcohol use: Yes    Alcohol/week: 3.0 standard drinks    Types: 3 Cans of beer per week  . Drug use: No  . Sexual activity: Yes    Birth control/protection: I.U.D.  Other Topics Concern  . Not on file  Social History  Narrative   Has 48 yr old and 48 year old   Works as Dance movement psychotherapist at [employer]   Separated   She completed 2 years college   Enjoys entertaining friends   Social Determinants of Radio broadcast assistant Strain:   . Difficulty of Paying Living Expenses:   Food Insecurity:   . Worried About Charity fundraiser in the Last Year:   . Arboriculturist in the Last Year:   Transportation Needs:   . Film/video editor (Medical):   Marland Kitchen Lack of Transportation (Non-Medical):   Physical Activity:   . Days of Exercise per Week:   . Minutes of Exercise per Session:   Stress:   . Feeling of Stress :   Social Connections:   . Frequency of Communication with Friends and Family:   . Frequency of Social Gatherings with Friends and Family:   . Attends Religious Services:   . Active Member of Clubs or Organizations:   . Attends Archivist Meetings:   Marland Kitchen Marital Status:   Intimate Partner Violence:   . Fear of Current or Ex-Partner:   . Emotionally Abused:   Marland Kitchen Physically Abused:   . Sexually Abused:     Past Surgical History:  Procedure Laterality Date  . BACK SURGERY  2004   Benign fatty tumor removed from mid upper back  . BREAST ENHANCEMENT SURGERY  2010  . BREAST IMPLANT REMOVAL  2013  . BREAST SURGERY  2014   Left breast biopsy, benign  . TUBAL LIGATION  2001  . UMBILICAL HERNIA REPAIR  2018    Family History  Problem Relation Age of Onset  . Arthritis Mother   . Osteoporosis Mother   . Diabetes Mother   . Diabetes Maternal Grandmother   . Diabetes Son   . Diabetes Maternal Aunt   . Diabetes Maternal Uncle     No Known Allergies  Current Outpatient Medications on File Prior to Visit  Medication Sig Dispense Refill  . amLODipine (NORVASC) 10 MG tablet TAKE 1 TABLET(10 MG) BY MOUTH DAILY 14 tablet 0  . amoxicillin-clavulanate (AUGMENTIN) 875-125 MG tablet Take 1 tablet by mouth 2 (two) times daily. (Patient not taking: Reported on 11/10/2019) 20 tablet 0  .  azelastine (ASTELIN) 0.1 % nasal spray Place 2 sprays into both nostrils 2 (two) times daily. Use in each nostril as directed 30 mL 0  . azithromycin (ZITHROMAX) 250 MG tablet Take 2 tablets by mouth on day 1, followed by 1 tablet by mouth daily for 4 days. (Patient not taking: Reported on 11/10/2019) 6 tablet 0  . clonazePAM (KLONOPIN) 0.5 MG tablet Take 1 tablet (0.5 mg total) by mouth 2 (two) times daily as needed for anxiety. (Patient not taking: Reported on 11/10/2019) 14 tablet 0  . fluticasone (FLONASE) 50 MCG/ACT nasal spray Place 2 sprays into both nostrils daily. (Patient not taking: Reported on 11/10/2019) 16 g 1  . fluticasone (FLONASE) 50 MCG/ACT nasal spray SHAKE LIQUID AND USE 2 SPRAYS IN EACH NOSTRIL DAILY (Patient not taking: Reported on 11/10/2019) 16 g 1  . guaiFENesin (MUCINEX) 600 MG 12 hr tablet Take 1 tablet (600 mg total) by mouth 2 (two) times daily as needed for cough or to loosen phlegm. (Patient not taking: Reported on 11/10/2019) 14 tablet 0  . HYDROcodone-homatropine (HYCODAN) 5-1.5 MG/5ML syrup Take 5 mLs by mouth every 6 (six) hours as needed. (Patient not taking: Reported on 11/10/2019) 100 mL 0  . levocetirizine (XYZAL) 5 MG tablet TAKE 1 TABLET(5 MG) BY MOUTH EVERY EVENING 30 tablet 0  . losartan (COZAAR) 100 MG tablet Take 1 tablet (100 mg total) by mouth daily. 30 tablet 3  . losartan-hydrochlorothiazide (HYZAAR) 100-25 MG tablet Take 1 tablet by mouth daily. 30 tablet 11  . ondansetron (ZOFRAN ODT) 8 MG disintegrating tablet Take 1 tablet (8 mg total) by mouth every 8 (eight) hours as needed for nausea or vomiting. (Patient not taking: Reported on 11/10/2019) 20 tablet 0  . oseltamivir (TAMIFLU) 75 MG capsule Take 1 capsule (75 mg total) by mouth 2 (two) times daily. (Patient not taking: Reported on 11/10/2019) 10 capsule 0  . traZODone (DESYREL) 50 MG tablet Take 0.5-1 tablets (25-50 mg total) by mouth at bedtime as needed for sleep. (Patient not taking: Reported on  11/10/2019) 30 tablet 3   No current facility-administered medications on file prior to visit.    BP 140/88   Pulse 60   Temp 98 F (36.7 C) (Oral)   Resp 18   Ht 5\' 2"  (1.575 m)   Wt 162 lb (73.5 kg)   SpO2 98%   BMI 29.63 kg/m       Objective:   Physical Exam  General Mental Status- Alert. General Appearance- Not in acute distress.   Skin General: Color- Normal Color. Moisture- Normal Moisture.  Neck Carotid Arteries- Normal color. Moisture- Normal Moisture. No carotid  bruits. No JVD.  Chest and Lung Exam Auscultation: Breath Sounds:-Normal.  Cardiovascular Auscultation:Rythm- Regular. Murmurs & Other Heart Sounds:Auscultation of the heart reveals- No Murmurs.  Abdomen Inspection:-Inspeection Normal. Palpation/Percussion:Note:No mass. Palpation and Percussion of the abdomen reveal- Non Tender, Non Distended + BS, no rebound or guarding.   Neurologic Cranial Nerve exam:- CN III-XII intact(No nystagmus), symmetric smile. Strength:- 5/5 equal and symmetric strength both upper and lower extremities.  Low back- no lumbar back pain. Coccyx- mild tenderness.      Assessment & Plan:  Your bp is mild borderline today. Recommend continue current medication/bp regimen. If bp is increasing then need to adjust medications.  You desire to loose weight. I think Weight Watchers App is reasonable or try optavia. I would hold off on plastic surgery due to risk.  Will get cmp and lipid panel. Future labs.  If faint coccyx pain persists let me know in 5 days and get can get xray.  Follow 2-3 weeks or as needed  Mackie Pai, PA-C   Time spent with patient today was  30 minutes which consisted of chart review, discussing diagnosis, treatment and documentation.

## 2019-11-12 ENCOUNTER — Telehealth: Payer: Self-pay | Admitting: Medical

## 2019-11-12 MED ORDER — AMLODIPINE BESYLATE 10 MG PO TABS: ORAL_TABLET | ORAL | 0 refills | Status: DC

## 2019-11-12 MED ORDER — LOSARTAN POTASSIUM 100 MG PO TABS: 100.0000 mg | ORAL_TABLET | Freq: Every day | ORAL | 3 refills | Status: DC

## 2019-11-12 NOTE — Telephone Encounter (Signed)
Rx sent 

## 2019-11-12 NOTE — Telephone Encounter (Signed)
Patient states that her prescription for Amlodipine and Losartan have not been sent to her pharmacy. Please call her [phone] to let her know when it has been sent in.  Thank you

## 2019-11-13 ENCOUNTER — Other Ambulatory Visit: Payer: Self-pay

## 2019-11-13 ENCOUNTER — Other Ambulatory Visit (INDEPENDENT_AMBULATORY_CARE_PROVIDER_SITE_OTHER): Payer: BC Managed Care – PPO

## 2019-11-13 DIAGNOSIS — R5383 Other fatigue: Secondary | ICD-10-CM | POA: Diagnosis not present

## 2019-11-13 DIAGNOSIS — I10 Essential (primary) hypertension: Secondary | ICD-10-CM | POA: Diagnosis not present

## 2019-11-13 LAB — LIPID PANEL
Cholesterol: 156 mg/dL (ref 0–200)
HDL: 57.2 mg/dL (ref >39.00)
LDL Cholesterol: 90 mg/dL (ref 0–99)
NonHDL: 98.53
Total CHOL/HDL Ratio: 3
Triglycerides: 43 mg/dL (ref 0.0–149.0)
VLDL: 8.6 mg/dL (ref 0.0–40.0)

## 2019-11-13 LAB — COMPREHENSIVE METABOLIC PANEL
ALT: 14 U/L (ref 0–35)
AST: 14 U/L (ref 0–37)
Albumin: 4.5 g/dL (ref 3.5–5.2)
Alkaline Phosphatase: 65 U/L (ref 39–117)
BUN: 9 mg/dL (ref 6–23)
CO2: 25 mEq/L (ref 19–32)
Calcium: 9.4 mg/dL (ref 8.4–10.5)
Chloride: 105 mEq/L (ref 96–112)
Creatinine, Ser: 0.78 mg/dL (ref 0.40–1.20)
GFR: 78.78 mL/min (ref >60.00)
Glucose, Bld: 100 mg/dL — ABNORMAL HIGH (ref 70–99)
Potassium: 4.4 mEq/L (ref 3.5–5.1)
Sodium: 136 mEq/L (ref 135–145)
Total Bilirubin: 0.5 mg/dL (ref 0.2–1.2)
Total Protein: 6.7 g/dL (ref 6.0–8.3)

## 2019-11-13 LAB — TSH: TSH: 1.94 u[IU]/mL (ref 0.35–4.50)

## 2019-11-13 LAB — T4, FREE: Free T4: 0.9 ng/dL (ref 0.60–1.60)

## 2019-11-14 ENCOUNTER — Other Ambulatory Visit: Payer: Self-pay | Admitting: Medical

## 2019-11-24 ENCOUNTER — Ambulatory Visit: Payer: BC Managed Care – PPO | Admitting: Medical

## 2019-12-03 ENCOUNTER — Other Ambulatory Visit: Payer: Self-pay | Admitting: Obstetrics and Gynecology

## 2019-12-03 DIAGNOSIS — N63 Unspecified lump in unspecified breast: Secondary | ICD-10-CM

## 2019-12-08 ENCOUNTER — Ambulatory Visit: Payer: BC Managed Care – PPO | Admitting: Medical

## 2019-12-08 ENCOUNTER — Other Ambulatory Visit: Payer: Self-pay | Admitting: Medical

## 2019-12-22 ENCOUNTER — Other Ambulatory Visit: Payer: Self-pay | Admitting: Medical

## 2019-12-30 ENCOUNTER — Other Ambulatory Visit: Payer: Self-pay | Admitting: Medical

## 2020-01-09 ENCOUNTER — Other Ambulatory Visit: Payer: Self-pay | Admitting: Medical

## 2020-01-26 ENCOUNTER — Other Ambulatory Visit: Payer: Self-pay | Admitting: Medical

## 2020-02-05 ENCOUNTER — Other Ambulatory Visit: Payer: Self-pay | Admitting: Medical

## 2020-02-06 ENCOUNTER — Other Ambulatory Visit: Payer: Self-pay | Admitting: Medical

## 2020-02-09 ENCOUNTER — Other Ambulatory Visit: Payer: Self-pay | Admitting: Medical

## 2020-02-10 ENCOUNTER — Telehealth: Payer: Self-pay | Admitting: Medical

## 2020-02-10 MED ORDER — AMLODIPINE BESYLATE 10 MG PO TABS: 10.0000 mg | ORAL_TABLET | Freq: Every day | ORAL | 0 refills | Status: DC

## 2020-02-10 NOTE — Addendum Note (Signed)
Addended by: Jeronimo Greaves on: 02/10/2020 01:08 PM   Modules accepted: Orders

## 2020-02-10 NOTE — Telephone Encounter (Signed)
Rx amlodipine sent to pt pharmacy. 

## 2020-02-10 NOTE — Telephone Encounter (Signed)
Medicine needs to go to [facility]  amLODipine [facility]) 10 MG tablet [[id number]]    [address], [place], [place], [place] [address]  Phone:  [phone] Fax:  [phone]

## 2020-02-10 NOTE — Telephone Encounter (Signed)
Rx sent 

## 2020-02-23 ENCOUNTER — Other Ambulatory Visit: Payer: Self-pay | Admitting: Medical

## 2020-05-31 ENCOUNTER — Other Ambulatory Visit: Payer: Self-pay | Admitting: Medical

## 2020-06-08 ENCOUNTER — Ambulatory Visit: Payer: BC Managed Care – PPO | Admitting: Family Medicine

## 2020-06-09 ENCOUNTER — Ambulatory Visit (INDEPENDENT_AMBULATORY_CARE_PROVIDER_SITE_OTHER): Payer: BC Managed Care – PPO | Admitting: Family Medicine

## 2020-06-09 ENCOUNTER — Other Ambulatory Visit: Payer: Self-pay

## 2020-06-09 ENCOUNTER — Encounter: Payer: Self-pay | Admitting: Family Medicine

## 2020-06-09 VITALS — BP 132/80 | HR 75 | Temp 99.1°F | Ht 65.0 in | Wt 163.0 lb

## 2020-06-09 DIAGNOSIS — R079 Chest pain, unspecified: Secondary | ICD-10-CM | POA: Diagnosis not present

## 2020-06-09 MED ORDER — LOSARTAN POTASSIUM 100 MG PO TABS: 100.0000 mg | ORAL_TABLET | Freq: Every day | ORAL | 1 refills | Status: DC

## 2020-06-09 MED ORDER — AMLODIPINE BESYLATE 10 MG PO TABS: ORAL_TABLET | ORAL | 1 refills | Status: DC

## 2020-06-09 NOTE — Patient Instructions (Signed)
Someone should reach out in the next few days regarding your echo.  Heat (pad or rice pillow in microwave) over affected area, 10-15 minutes twice daily.   Ice/cold pack over area for 10-15 min twice daily.  OK to take Tylenol 1000 mg (2 extra strength tabs) or 975 mg (3 regular strength tabs) every 6 hours as needed.  Let us know if you need anything.   Pectoralis Major Rehab Ask your health care provider which exercises are safe for you. Do exercises exactly as told by your health care provider and adjust them as directed. It is normal to feel mild stretching, pulling, tightness, or discomfort as you do these exercises, but you should stop right away if you feel sudden pain or your pain gets worse.Do not begin these exercises until told by your health care provider. Stretching and range of motion exercises These exercises warm up your muscles and joints and improve the movement and flexibility of your shoulder. These exercises can also help to relieve pain, numbness, and tingling. Exercise A: Pendulum  1. Stand near a wall or a surface that you can hold onto for balance. 2. Bend at the waist and let your left / right arm hang straight down. Use your other arm to keep your balance. 3. Relax your arm and shoulder muscles, and move your hips and your trunk so your left / right arm swings freely. Your arm should swing because of the motion of your body, not because you are using your arm or shoulder muscles. 4. Keep moving so your arm swings in the following directions, as told by your health care provider: ? Side to side. ? Forward and backward. ? In clockwise and counterclockwise circles. 5. Slowly return to the starting position. Repeat 2 times. Complete this exercise 3 times per week. Exercise B: Abduction, standing 1. Stand and hold a broomstick, a cane, or a similar object. Place your hands a little more than shoulder-width apart on the object. Your left / right hand should be  palm-up, and your other hand should be palm-down. 2. While keeping your elbow straight and your shoulder muscles relaxed, push the stick across your body toward your left / right side. Raise your left / right arm to the side of your body and then over your head until you feel a stretch in your shoulder. ? Stop when you reach the angle that is recommended by your health care provider. ? Avoid shrugging your shoulder while you raise your arm. Keep your shoulder blade tucked down toward the middle of your spine. 3. Hold for 10 seconds. 4. Slowly return to the starting position. Repeat 2 times. Complete this exercise 3 times per week. Exercise C: Wand flexion, supine  1. Lie on your back. You may bend your knees for comfort. 2. Hold a broomstick, a cane, or a similar object so that your hands are about shoulder-width apart on the object. Your palms should face toward your feet. 3. Raise your left / right arm in front of your face, then behind your head (toward the floor). Use your other hand to help you do this. Stop when you feel a gentle stretch in your shoulder, or when you reach the angle that is recommended by your health care provider. 4. Hold for 3 seconds. 5. Use the broomstick and your other arm to help you return your left / right arm to the starting position. Repeat 2 times. Complete this exercise 3 times per week. Exercise D: Wand shoulder  external rotation 1. Stand and hold a broomstick, a cane, or a similar object so your handsare about shoulder-width apart on the object. 2. Start with your arms hanging down, then bend both elbows to an "L" shape (90 degrees). 3. Keep your left / right elbow at your side. Use your other hand to push the stick so your left / right forearm moves away from your body, out to your side. ? Keep your left / right elbow bent to 90 degrees and keep it against your side. ? Stop when you feel a gentle stretch in your shoulder, or when you reach the angle  recommended by your health care provider. 4. Hold for 10 seconds. 5. Use the stick to help you return your left / right arm to the starting position. Repeat 2 times. Complete this exercise 3 times per week. Strengthening exercises These exercises build strength and endurance in your shoulder. Endurance is the ability to use your muscles for a long time, even after your muscles get tired. Exercise E: Scapular protraction, standing 1. Stand so you are facing a wall. Place your feet about one arm-length away from the wall. 2. Place your hands on the wall and straighten your elbows. 3. Keep your hands on the wall as you push your upper back away from the wall. You should feel your shoulder blades sliding forward.Keep your elbows and your head still. ? If you are not sure that you are doing this exercise correctly, ask your health care provider for more instructions. 4. Hold for 3 seconds. 5. Slowly return to the starting position. Let your muscles relax completely before you repeat this exercise. Repeat 2 times. Complete this exercise 3 times per week. Exercise F: Shoulder blade squeezes  (scapular retraction) 1. Sit with good posture in a stable chair. Do not let your back touch the back of the chair. 2. Your arms should be at your sides with your elbows bent. You may rest your forearms on a pillow if that is more comfortable. 3. Squeeze your shoulder blades together. Bring them down and back. ? Keep your shoulders level. ? Do not lift your shoulders up toward your ears. 4. Hold for 3 seconds. 5. Return to the starting position. Repeat 2 times. Complete this exercise 3 times per week. This information is not intended to replace advice given to you by your health care provider. Make sure you discuss any questions you have with your health care provider. Document Released: 04/10/2005 Document Revised: 01/20/2016 Document Reviewed: 12/27/2014 Elsevier Interactive Patient Education  [employer].

## 2020-06-09 NOTE — Progress Notes (Signed)
ekg 

## 2020-06-09 NOTE — Progress Notes (Signed)
Chief Complaint  Patient presents with  . Arm Pain    Left arm pain Chest pressure     Debra Ayers is a 50 y.o. female here for evaluation of central chest pain.  Duration of issue: 5 days Quality: pressure, tightness Palliation: none Provocation: pushing on it Not related to exertion Severity: 5/10 Radiation: down L arm Duration of chest pain: Constant Associated symptoms: arm pain; no SOB, jaw pain, N/V, skin changes Cardiac history: HTN Family heart history: none Smoker? No  Past Medical History:  Diagnosis Date  . Anemia   . Heart murmur   . Hypertension    medication  . Uterine fibroid    Family History  Problem Relation Age of Onset  . Arthritis Mother   . Osteoporosis Mother   . Diabetes Mother   . Diabetes Maternal Grandmother   . Diabetes Son   . Diabetes Maternal Aunt   . Diabetes Maternal Uncle     BP 132/80 (BP Location: Left Arm, Patient Position: Sitting, Cuff Size: Normal)   Pulse 75   Temp 99.1 F (37.3 C) (Oral)   Ht 5\' 5"  (1.651 m)   Wt 163 lb (73.9 kg)   SpO2 100%   BMI 27.12 kg/m  Gen: awake, alert, appears stated age HEENT: PERRLA, MMM Neck: No masses or asymmetry Heart: RRR, no bruits, no LE edema Lungs: CTAB, no accessory muscle use Abd: Soft, NT, ND, no masses or organomegaly MSK: chest pain is  reproducible to palpation Psych: Age appropriate judgment and insight, nml mood and affect  Chest pain, unspecified type - Plan: EKG 12-Lead, ECHOCARDIOGRAM COMPLETE  EKG shows NSR, normal intervals, normal axis, no T wave or ST segment abnormalities, good R wave progression.  Unlikely to be related to heart. Pt is anxious, will ck echo as she is having a procedure done. Declined cardio referral at this time.  Pec stretches/exercises provided.  F/u prn. The patient voiced understanding and agreement to the plan.  [place], DO 06/09/20 9:53 AM

## 2020-06-22 HISTORY — PX: OTHER SURGICAL HISTORY: SHX169

## 2020-06-22 HISTORY — PX: LIPOSUCTION: SHX10

## 2020-07-19 ENCOUNTER — Ambulatory Visit (HOSPITAL_BASED_OUTPATIENT_CLINIC_OR_DEPARTMENT_OTHER): Payer: BC Managed Care – PPO | Attending: Family Medicine

## 2020-07-23 HISTORY — PX: AUGMENTATION MAMMAPLASTY: SUR837

## 2020-08-12 IMAGING — MG MM BREAST LOCALIZATION CLIP
4 series · 4 of 12 positions shown · non-contrast
Comparison: Previous exam(s).

CLINICAL DATA: 48-year-old female presents for ultrasound-guided
biopsy of a mass in the upper inner left breast, 10 o'clock position
5 cm from the nipple.

EXAM:
DIAGNOSTIC LEFT MAMMOGRAM POST ULTRASOUND BIOPSY

[L ML synth-2D]
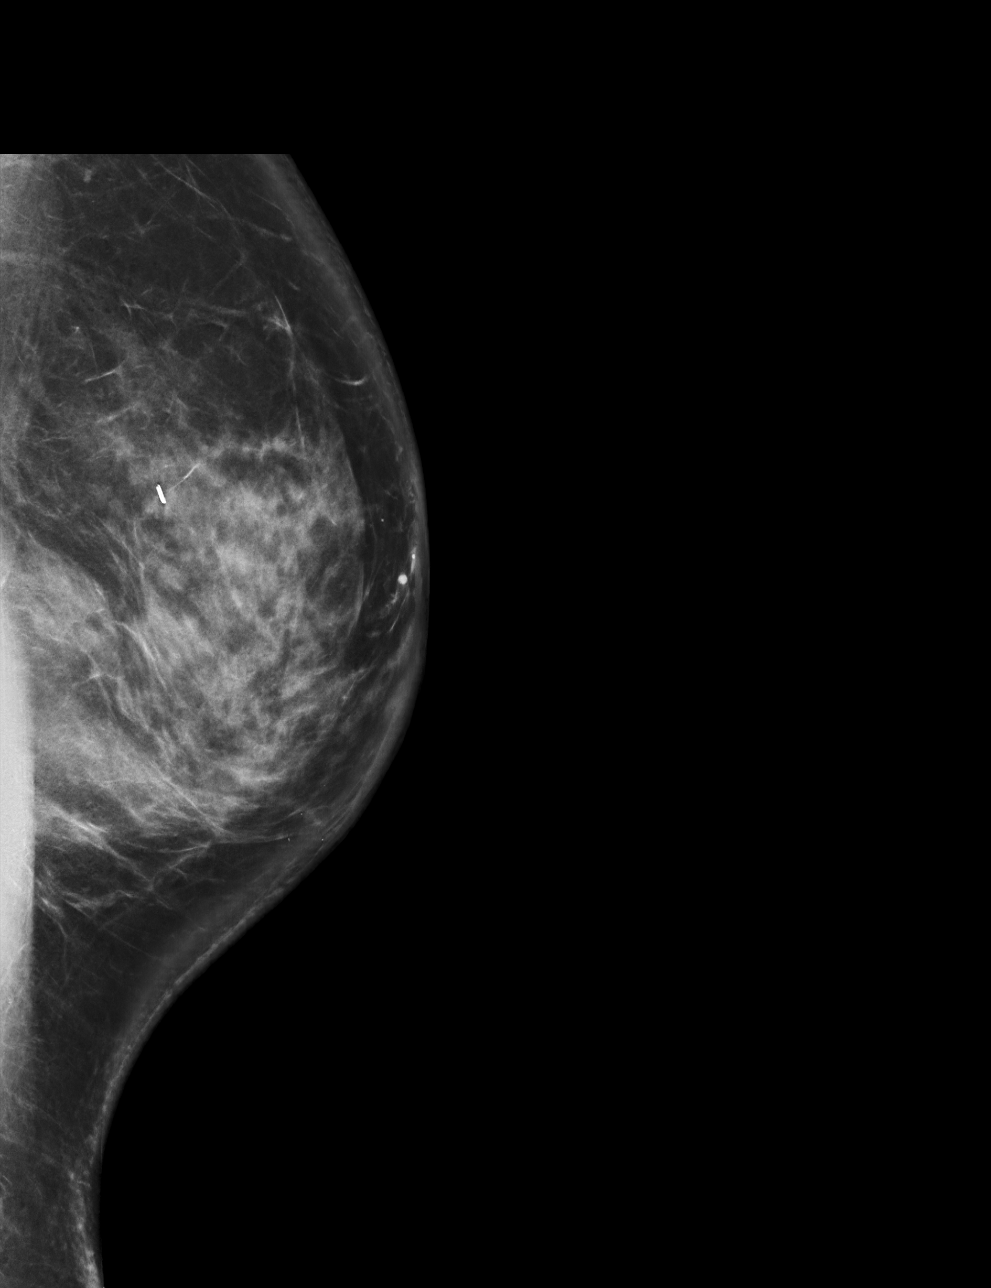

[L CC synth-2D]
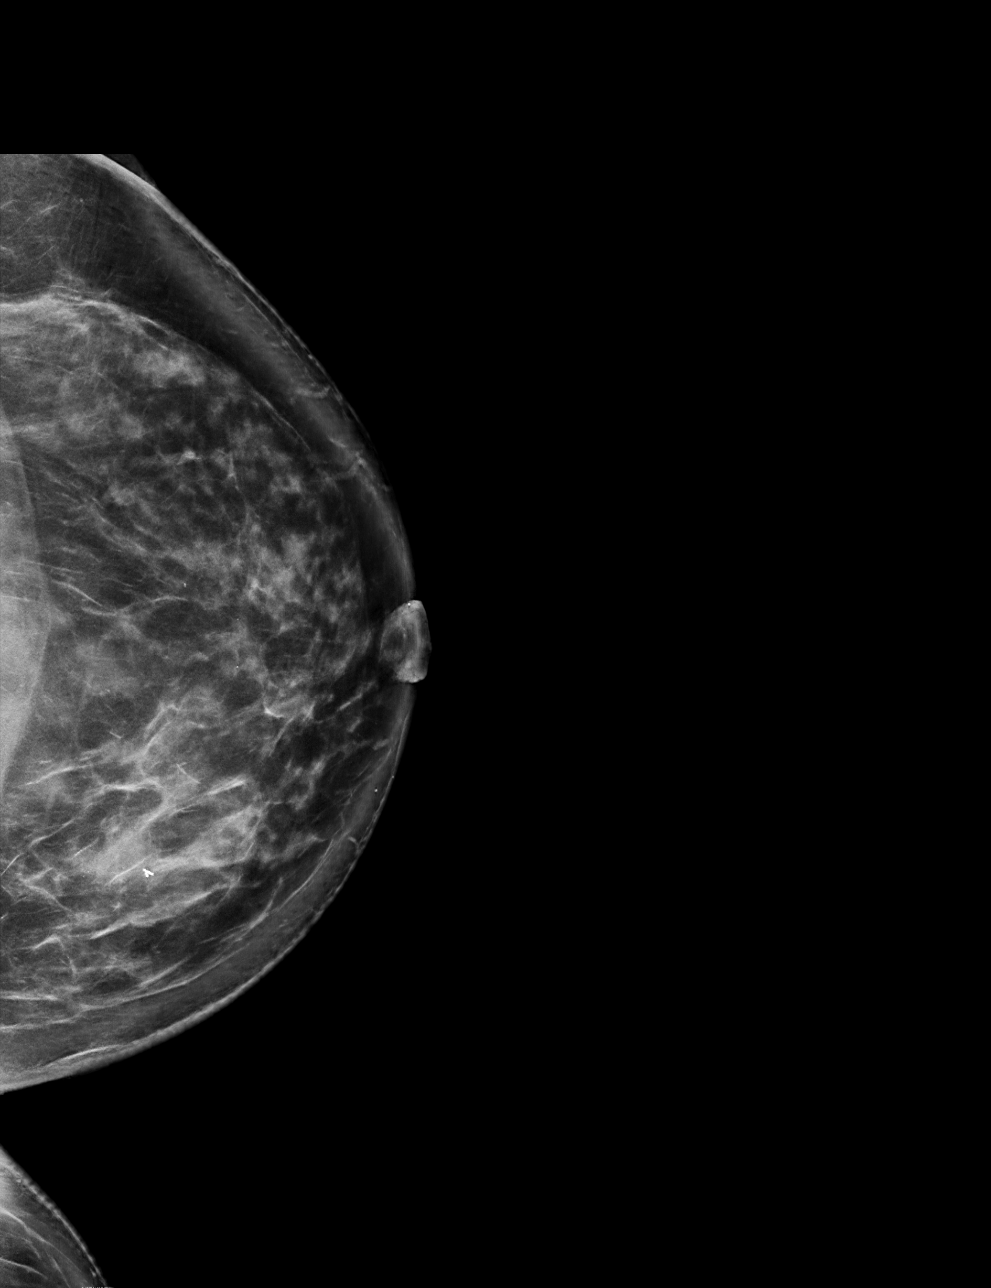

[L ML tomo · tomo slice 46/91.0]
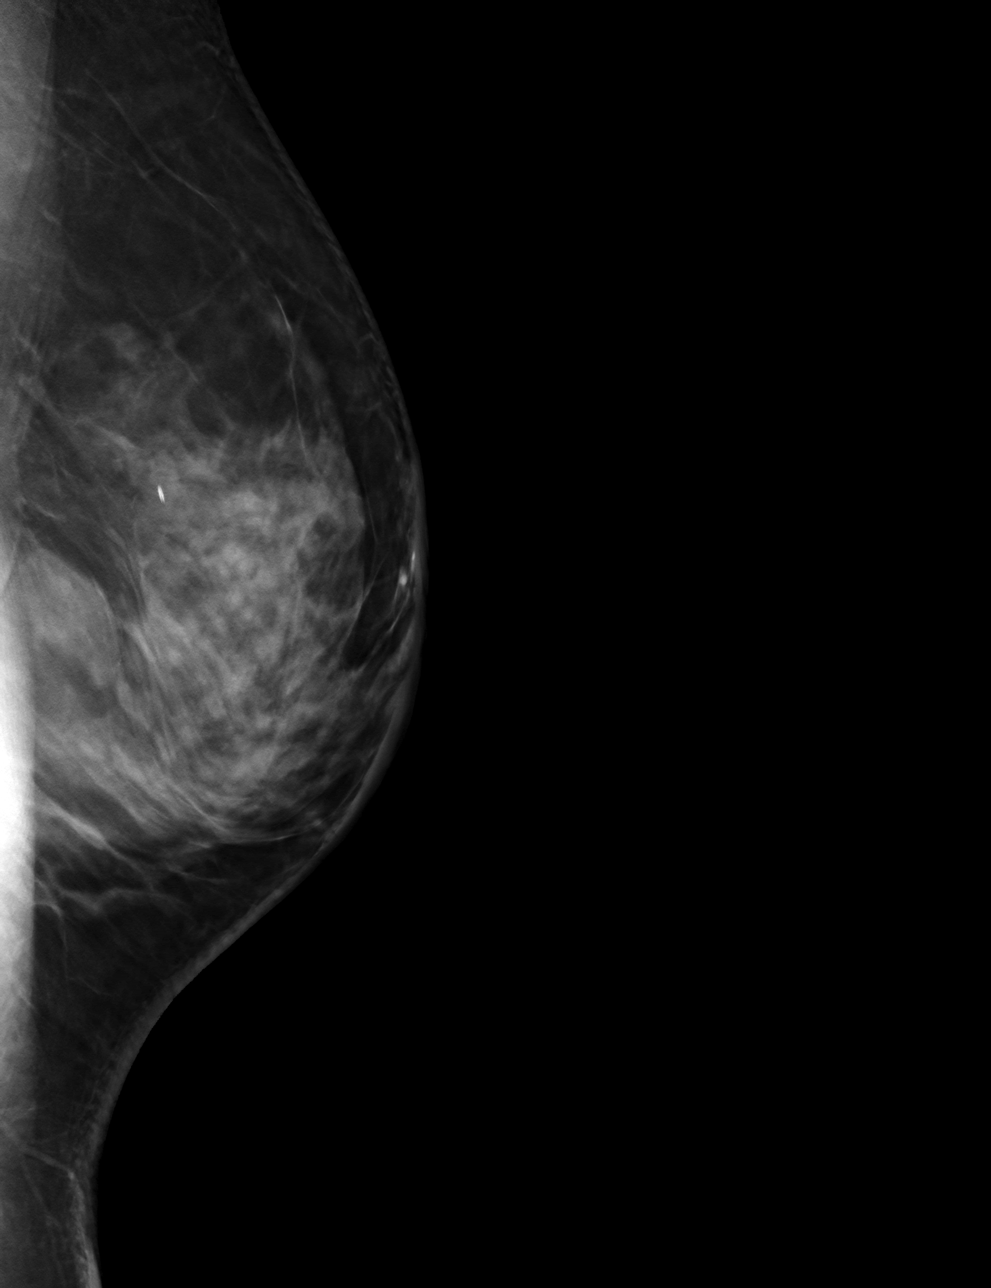

[L CC tomo · tomo slice 45/89.0]
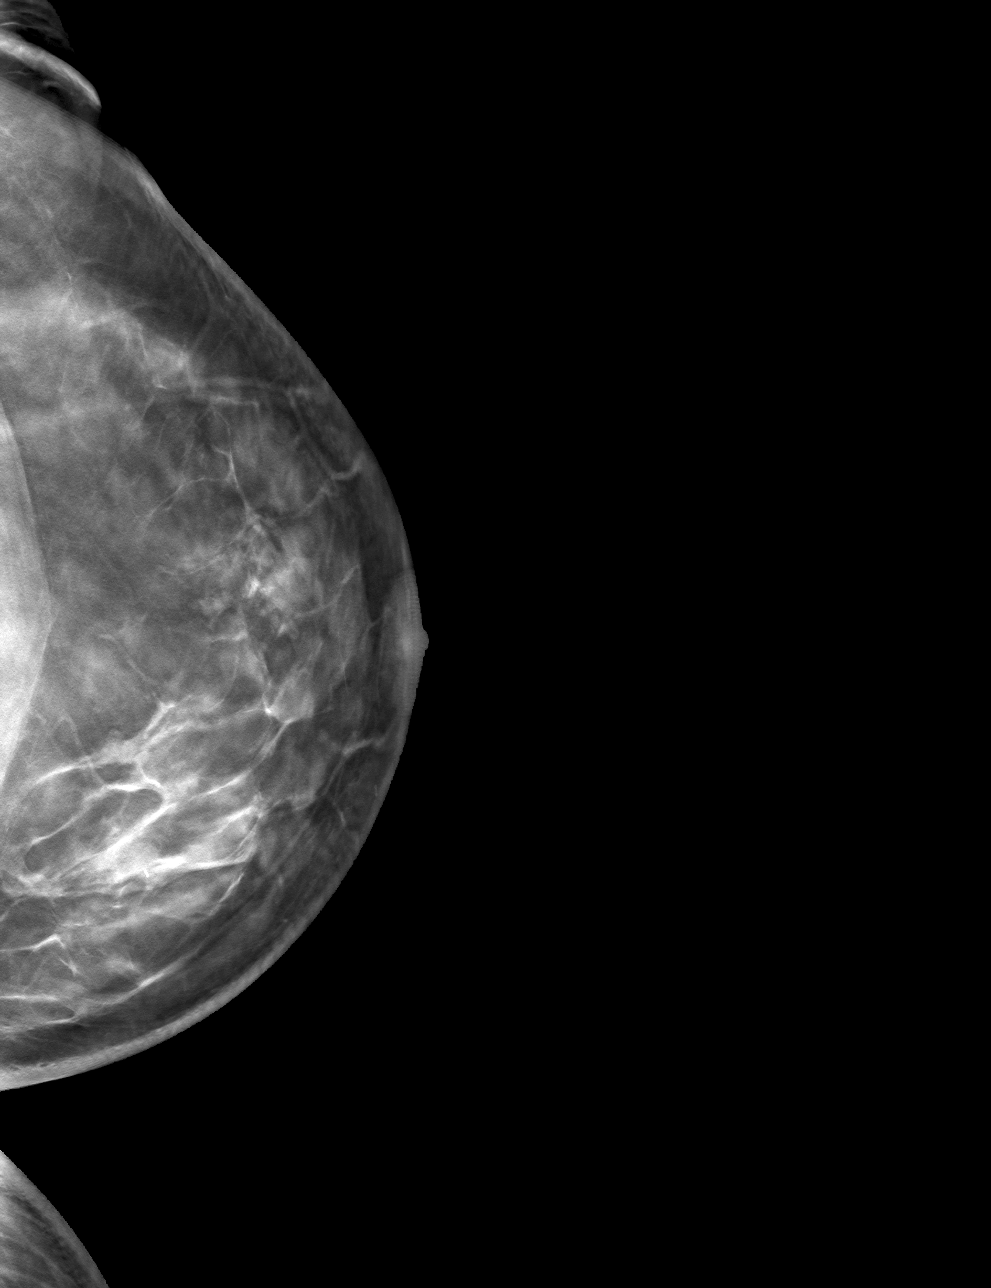

[4 of 12 positions shown; findings below may reference images not displayed]

FINDINGS: Mammographic images were obtained following ultrasound guided biopsy
of left breast mass. The biopsy marking clip is in expected position
at the site of biopsy.
IMPRESSION: Appropriate positioning of the ribbon shaped biopsy marking clip at
the site of biopsy in the upper inner quadrant.

Final Assessment: Post Procedure Mammograms for Marker Placement

## 2020-08-12 IMAGING — US US BREAST BX W LOC DEV 1ST LESION IMG BX SPEC US GUIDE*L*
1 series · 10 of 10 positions shown · non-contrast
Comparison: Previous exam(s).
COMPARISON: Previous exam(s).

Addendum:
CLINICAL DATA: Ultrasound-guided core needle biopsy was recommended
of a mass in the 10 o'clock position left breast 5 cm from the
nipple.

EXAM:
ULTRASOUND GUIDED LEFT BREAST CORE NEEDLE BIOPSY

[Series 1: us breast bx w loc dev 1st lesion img bx spec us g · 0.07mm/px · 10 of 10 slices shown]
[im 1/10]
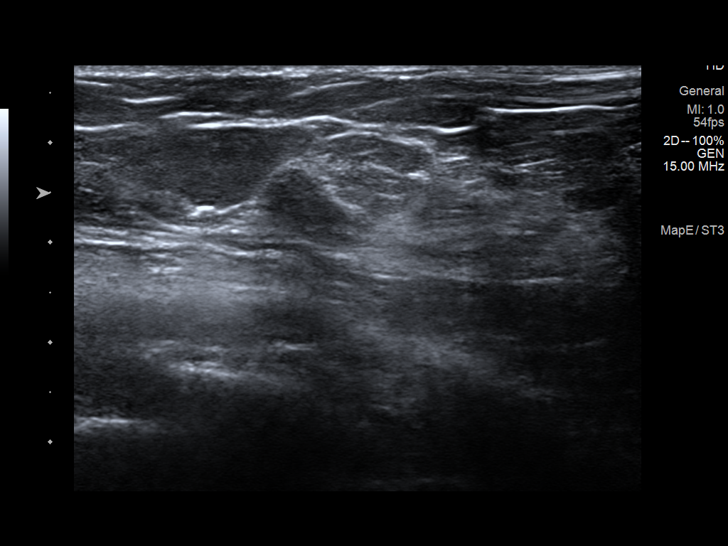
[im 2/10]
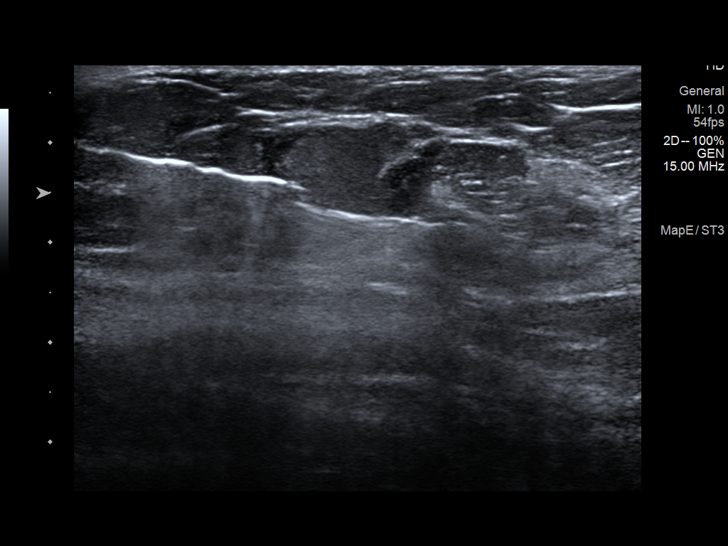
[im 3/10]
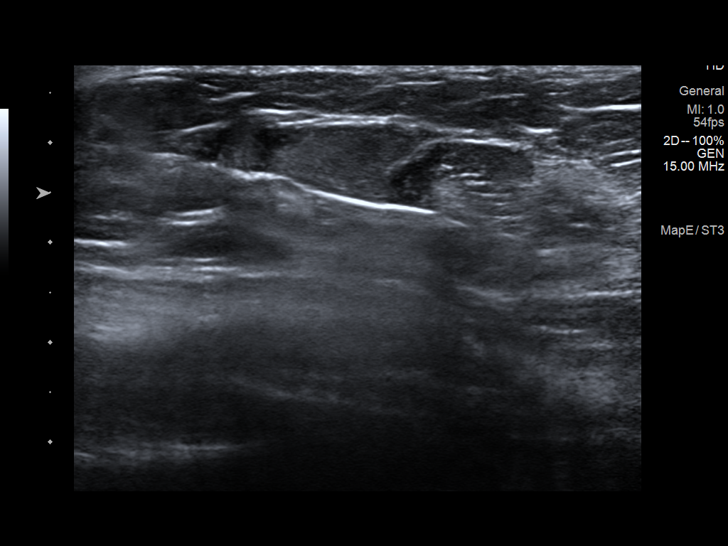
[im 4/10]
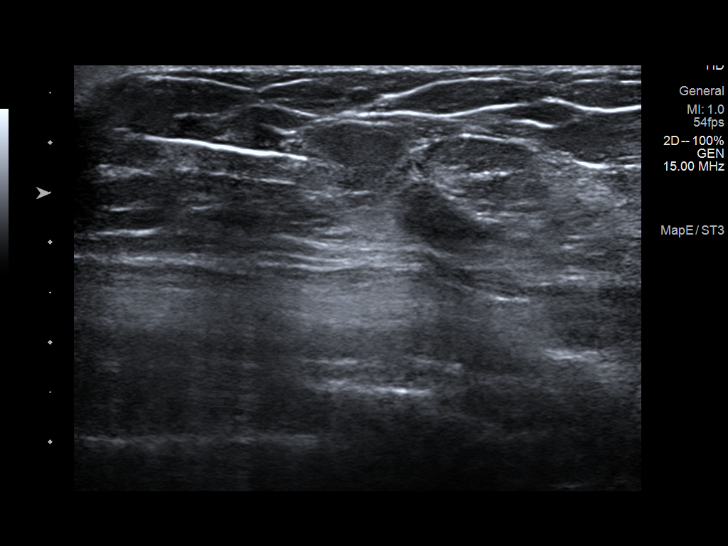
[im 5/10]
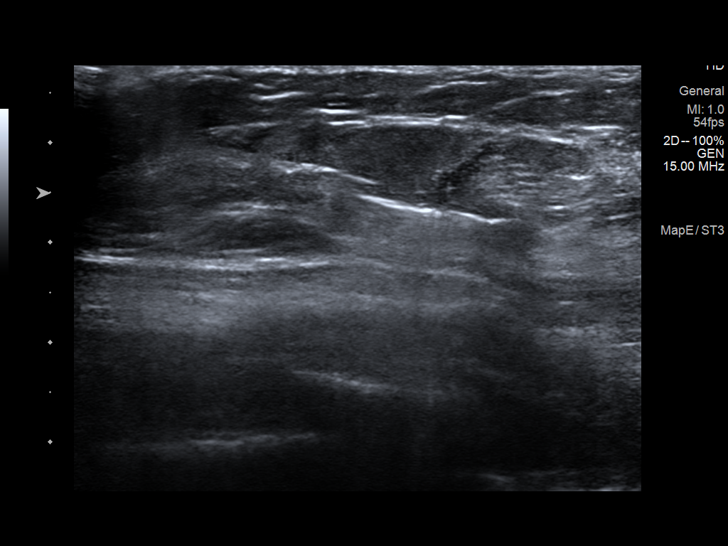
[im 6/10]
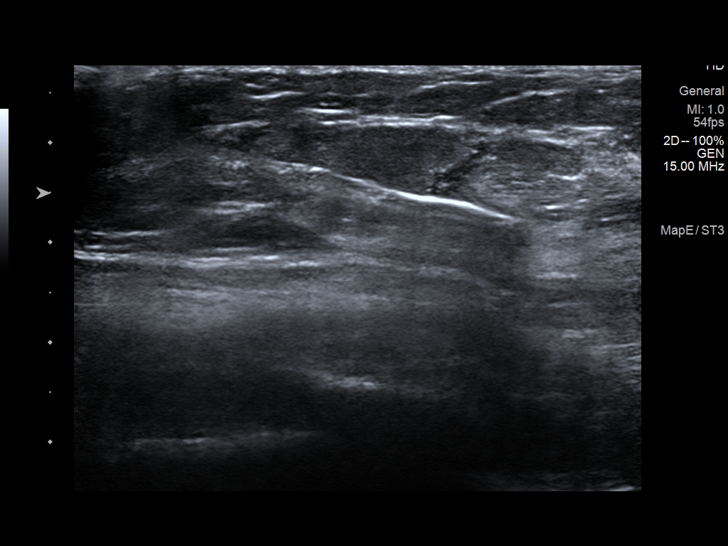
[im 7/10]
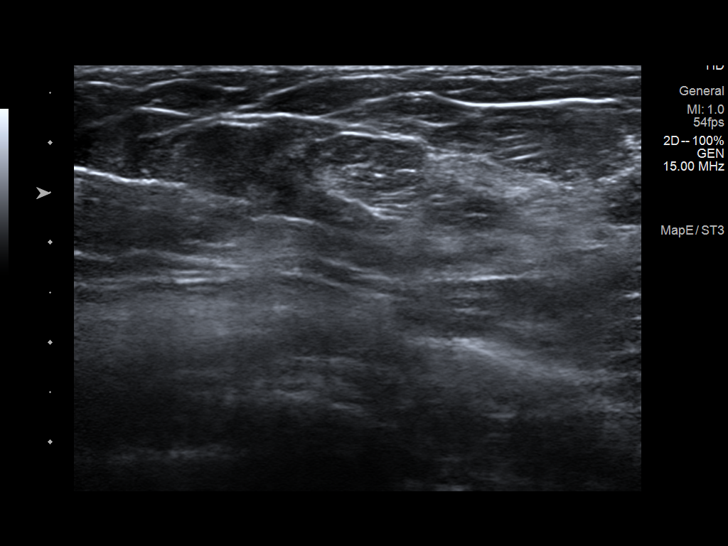
[im 8/10]
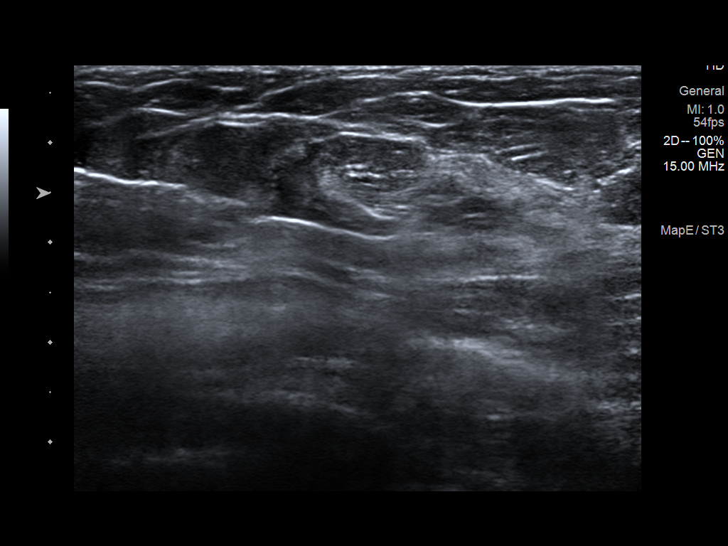
[im 9/10]
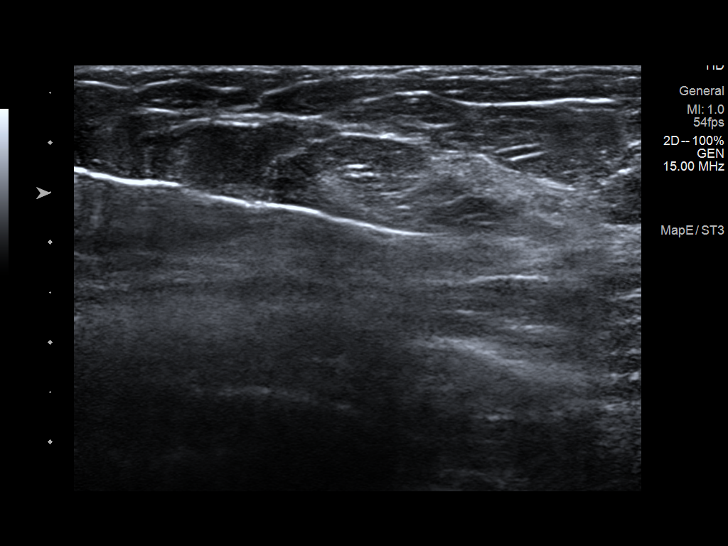
[im 10/10]
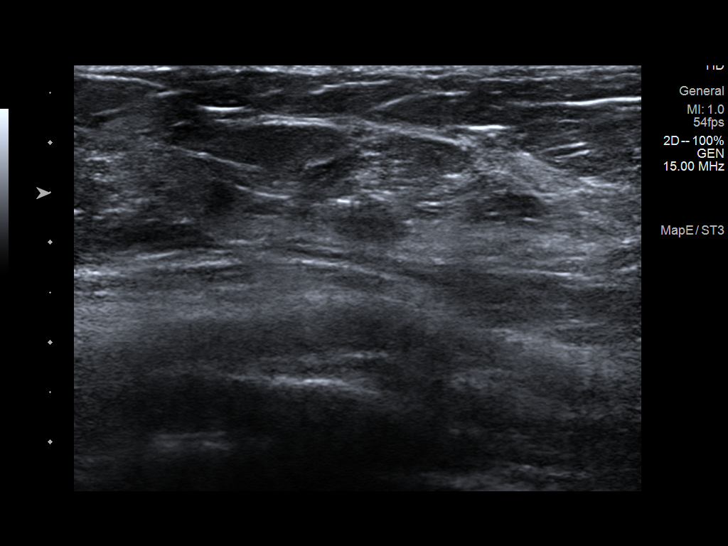

[10 of 10 positions shown; findings below may reference images not displayed]



Lesion quadrant: Upper inner quadrant

Using sterile technique and 1% Lidocaine as local anesthetic, under
direct ultrasound visualization, a 14 gauge Ansiaux device was
used to perform biopsy of mass in the 10 o'clock position left
breast using a medial approach. At the conclusion of the procedure
ribbon tissue marker clip was deployed into the biopsy cavity.
Follow up 2 view mammogram was performed and dictated separately.
IMPRESSION: Ultrasound guided biopsy of the left breast. No apparent
complications.

ADDENDUM:
Pathology revealed FIBROADENOMA of the Left breast, 10 o'clock,
4cmfn. This was found to be concordant by Dr. Rooney Largaespada.

Pathology results were discussed with the patient by telephone. The
patient reported doing well after the biopsy with tenderness at the
site. Post biopsy instructions and care were reviewed and questions
were answered. The patient was encouraged to call The [REDACTED]

The patient was asked to return for Left diagnostic mammography and
ultrasound in 6 months to evaluate the probably benign mass at [DATE]
position and informed a reminder notice would be sent regarding this
appointment.

Pathology results reported by Rossyy Teixeira, RN on 06/03/2019.



Lesion quadrant: Upper inner quadrant

Using sterile technique and 1% Lidocaine as local anesthetic, under
direct ultrasound visualization, a 14 gauge Ansiaux device was
used to perform biopsy of mass in the 10 o'clock position left
breast using a medial approach. At the conclusion of the procedure
ribbon tissue marker clip was deployed into the biopsy cavity.
Follow up 2 view mammogram was performed and dictated separately.
IMPRESSION: Ultrasound guided biopsy of the left breast. No apparent
complications.

## 2020-08-19 ENCOUNTER — Other Ambulatory Visit: Payer: Self-pay

## 2020-08-19 ENCOUNTER — Ambulatory Visit (INDEPENDENT_AMBULATORY_CARE_PROVIDER_SITE_OTHER): Payer: BC Managed Care – PPO | Admitting: Family Medicine

## 2020-08-19 ENCOUNTER — Encounter: Payer: Self-pay | Admitting: Family Medicine

## 2020-08-19 VITALS — BP 146/92 | HR 93 | Temp 98.0°F | Ht 65.0 in | Wt 159.0 lb

## 2020-08-19 DIAGNOSIS — I10 Essential (primary) hypertension: Secondary | ICD-10-CM

## 2020-08-19 DIAGNOSIS — L7682 Other postprocedural complications of skin and subcutaneous tissue: Secondary | ICD-10-CM

## 2020-08-19 MED ORDER — GABAPENTIN 300 MG PO CAPS: ORAL_CAPSULE | ORAL | 0 refills | Status: DC

## 2020-08-19 NOTE — Progress Notes (Signed)
[facility] [address] [place] [address] Dept: [phone] Dept Fax: [phone]  Office Visit  Subjective:    Patient ID: Debra Ayers, female    DOB: [birth year], 50 y.o..   MRN: [id number]  Chief Complaint  Patient presents with  . Acute Visit    C/o having x a burning sensation in her low back/abdomen x 1 month after [place] tum[place] She has been using in lidocaine spray and Tylenol with little relief. Has had 3 covid vaccines unsure of dates.     History of Present Illness:  Patient is in today with a complaint of pain related to healing from a tummy tuck and liposuction (back and lower abdomen). She notes she had the procedure performed about a month ago [place] She initially had a drain in place. After removal, she has not seen any increased swelling or drainage. She feels her wounds are progressing well. She does note numbness associated with her surgery. She has noted over the past week or so a gradual increase in a burning sensation to the abdomen. This has made it difficult to sleep. She tried using Tylenol, neopsorin, and a topical spray that had lidocaine in it. She is looking for pain relief at this point.  Past Medical History: Patient Active Problem List   Diagnosis Date Noted  . Right elbow pain 11/29/2015  . Visit for preventive health examination 09/10/2015  . Chronic idiopathic constipation 08/12/2014  . Family history of diabetes mellitus 06/25/2013  . Umbilical hernia, incarcerated 11/20/2012  . Herpetic whitlow 02/12/2012  . HTN (hypertension) 02/12/2012  . Hearing problem 02/12/2012   Past Surgical History:  Procedure Laterality Date  . BACK SURGERY  2004   Benign fatty tumor removed from mid upper back  . BREAST ENHANCEMENT SURGERY  2010  . BREAST IMPLANT REMOVAL  2013  . BREAST SURGERY  2014   Left breast biopsy, benign  .  LIPOSUCTION  06/2020  . TUBAL LIGATION  2001  . tummy tuck  06/2020  . UMBILICAL HERNIA REPAIR  2018   Family History  Problem Relation Age of Onset  . Arthritis Mother   . Osteoporosis Mother   . Diabetes Mother   . Diabetes Maternal Grandmother   . Diabetes Son   . Diabetes Maternal Aunt   . Diabetes Maternal Uncle    Outpatient Medications Prior to Visit  Medication Sig Dispense Refill  . amLODipine (NORVASC) 10 MG tablet TAKE 1 TABLET(10 MG) BY MOUTH DAILY 90 tablet 1  . losartan (COZAAR) 100 MG tablet Take 1 tablet (100 mg total) by mouth daily. 90 tablet 1   No facility-administered medications prior to visit.   No Known Allergies    Objective:   Today's Vitals   08/19/20 1031  BP: (!) 146/92  Pulse: 93  Temp: 98 F (36.7 C)  TempSrc: Temporal  SpO2: 97%  Weight: 159 lb (72.1 kg)  Height: 5\' 5"  (1.651 m)   Body mass index is 26.46 kg/m.   General: Well developed, well nourished. No acute distress. Abdomen: There is a long horizontal surgical wound across the lower abdomen. This is healing well, without signs of   redness or drainage. There is a single stitch visible a the right extreme of the scar. There are alos healing of revision of the   umbilicus and this appears to be without infection. There is a slight firmness ot the skin and  some increased warmth   generally across the abdomen. Back: Straight. There is a small incision at the upper end of the gluteal cleft that is healing well with some visible sutures   still in place. Similar tot he abdomen, there is some firmness to the skin with increased warmth, but no redness or  drainage noted. Psych: Alert and oriented. Normal mood and affect.  Health Maintenance Due  Topic Date Due  . Hepatitis C Screening  Never done  . TETANUS/TDAP  Never done  . COLONOSCOPY (Pts 45-35yrs Insurance coverage will need to be confirmed)  Never done  . PAP SMEAR-Modifier  09/05/2017     Assessment & Plan:   1.  Incisional pain The wounds appear to be healing at this point. I suspect that disrupted skin nerves are healing and now causing the burning sensation. We will try a month;s course of gabapentin to see if this will improve that pain.  - gabapentin (NEURONTIN) 300 MG capsule; Take 1 capsule (300 mg total) by mouth daily for 1 day, THEN 1 capsule (300 mg total) 2 (two) times daily for 1 day, THEN 1 capsule (300 mg total) 3 (three) times daily for 1 day.  Dispense: 87 capsule; Refill: 0  2. Primary hypertension Blood pressure is elevated today, potentially worsened by her post-operative pain. I recommend she return to see her PA in the next 10 days to reassess her blood pressure.  Haydee Salter, MD

## 2020-09-15 ENCOUNTER — Other Ambulatory Visit: Payer: Self-pay | Admitting: Family Medicine

## 2020-09-15 DIAGNOSIS — L7682 Other postprocedural complications of skin and subcutaneous tissue: Secondary | ICD-10-CM

## 2020-09-15 NOTE — Telephone Encounter (Signed)
Refill request for: Gabapentin 300 mg LR 08/19/20 LOV 08/19/20 FOV  None scheduled.  Please review and advise.  Thanks.  Dm/cma

## 2020-11-07 ENCOUNTER — Other Ambulatory Visit: Payer: Self-pay | Admitting: Medical

## 2021-02-16 ENCOUNTER — Ambulatory Visit (INDEPENDENT_AMBULATORY_CARE_PROVIDER_SITE_OTHER): Payer: BC Managed Care – PPO | Admitting: Medical

## 2021-02-16 ENCOUNTER — Other Ambulatory Visit: Payer: Self-pay

## 2021-02-16 ENCOUNTER — Other Ambulatory Visit (HOSPITAL_COMMUNITY)
Admission: RE | Admit: 2021-02-16 | Discharge: 2021-02-16 | Disposition: A | Discharge status: Home / Self Care | Payer: BC Managed Care – PPO | Source: Ambulatory Visit | Attending: Medical | Admitting: Medical

## 2021-02-16 ENCOUNTER — Encounter: Payer: Self-pay | Admitting: Medical

## 2021-02-16 VITALS — BP 135/77 | HR 72 | Temp 98.5°F | Resp 18 | Ht 65.0 in | Wt 153.0 lb

## 2021-02-16 DIAGNOSIS — R8290 Unspecified abnormal findings in urine: Secondary | ICD-10-CM | POA: Insufficient documentation

## 2021-02-16 DIAGNOSIS — R300 Dysuria: Secondary | ICD-10-CM | POA: Diagnosis not present

## 2021-02-16 DIAGNOSIS — I10 Essential (primary) hypertension: Secondary | ICD-10-CM

## 2021-02-16 DIAGNOSIS — N898 Other specified noninflammatory disorders of vagina: Secondary | ICD-10-CM | POA: Insufficient documentation

## 2021-02-16 DIAGNOSIS — J309 Allergic rhinitis, unspecified: Secondary | ICD-10-CM

## 2021-02-16 LAB — POCT URINALYSIS DIPSTICK
Blood, UA: NEGATIVE
Glucose, UA: NEGATIVE
Ketones, UA: NEGATIVE
Nitrite, UA: NEGATIVE
Protein, UA: POSITIVE — AB
Spec Grav, UA: >=1.03 — AB (ref 1.010–1.025)
Urobilinogen, UA: 0.2 E.U./dL
pH, UA: 5 (ref 5.0–8.0)

## 2021-02-16 MED ORDER — FLUTICASONE PROPIONATE 50 MCG/ACT NA SUSP: 2.0000 | Freq: Every day | NASAL | 3 refills | Status: DC

## 2021-02-16 NOTE — Progress Notes (Signed)
Subjective:    Patient ID: Debra Ayers, female    DOB: [birth year], 50 y.o.   MRN: [id number]  HPI  Pt states about 2 weeks ago she had some odor from urine or from vaginal area. Pt states she used some boric acid. Pt states seems to have worked. Pt is not sexually active.  Htn. Bp well controlled today. On losartan 100 mg daily. Amlodipine 10 mg daily.  Hx of allergic rhinitis mild frontal ha. Pt thinks allergies. Request refill of her flonase.  Pt gets allergies in fall and spring.  Pt has iud.  Review of Systems  Constitutional:  Negative for chills, fatigue and fever.  HENT:  Positive for congestion, sinus pressure and sinus pain.   Respiratory:  Negative for cough, chest tightness, shortness of breath and wheezing.   Cardiovascular:  Negative for chest pain and palpitations.  Gastrointestinal:  Negative for abdominal pain.  Genitourinary:  Negative for dysuria and flank pain.       Odor to urine and DC. Not sexually active since passing of her husband.  Musculoskeletal:  Negative for back pain and joint swelling.  Skin:  Negative for rash.  Neurological:  Negative for dizziness, numbness and headaches.  Psychiatric/Behavioral:  Negative for behavioral problems.      Past Medical History:  Diagnosis Date   Anemia    Heart murmur    Hypertension    medication   Uterine fibroid      Social History   Socioeconomic History   Marital status: Widowed    Spouse name: Not on file   Number of children: 2   Years of education: Not on file   Highest education level: Not on file  Occupational History    Employer: [employer]  Tobacco Use   Smoking status: Never   Smokeless tobacco: Never  Vaping Use   Vaping Use: Never used  Substance and Sexual Activity   Alcohol use: Yes    Alcohol/week: 3.0 standard drinks    Types: 3 Cans of beer per week   Drug use: No   Sexual activity: Yes    Birth control/protection: I.U.D.  Other Topics Concern   Not on file   Social History Narrative   Has 49 yr old and 49 year old   Works as Dance movement psychotherapist at [employer]   Separated   She completed 2 years college   Enjoys entertaining friends   Social Determinants of Radio broadcast assistant Strain: Not on file  Food Insecurity: Not on file  Transportation Needs: Not on file  Physical Activity: Not on file  Stress: Not on file  Social Connections: Not on file  Intimate Partner Violence: Not on file    Past Surgical History:  Procedure Laterality Date   BACK SURGERY  2004   Benign fatty tumor removed from mid upper back   BREAST ENHANCEMENT SURGERY  2010   BREAST IMPLANT REMOVAL  2013   BREAST SURGERY  2014   Left breast biopsy, benign   LIPOSUCTION  06/2020   TUBAL LIGATION  2001   tummy tuck  41/9622   UMBILICAL HERNIA REPAIR  2018    Family History  Problem Relation Age of Onset   Arthritis Mother    Osteoporosis Mother    Diabetes Mother    Diabetes Maternal Grandmother    Diabetes Son    Diabetes Maternal Aunt    Diabetes Maternal Uncle     No Known Allergies  Current  Outpatient Medications on File Prior to Visit  Medication Sig Dispense Refill   amLODipine (NORVASC) 10 MG tablet TAKE 1 TABLET(10 MG) BY MOUTH DAILY 90 tablet 1   gabapentin (NEURONTIN) 300 MG capsule Take 1 capsule (300 mg total) by mouth daily for 1 day, THEN 1 capsule (300 mg total) 2 (two) times daily for 1 day, THEN 1 capsule (300 mg total) 3 (three) times daily for 1 day. 87 capsule 0   losartan (COZAAR) 100 MG tablet TAKE 1 TABLET BY MOUTH  DAILY 90 tablet 3   No current facility-administered medications on file prior to visit.    BP 135/77   Pulse 72   Temp 98.5 F (36.9 C)   Resp 18   Ht 5\' 5"  (1.651 m)   Wt 153 lb (69.4 kg)   SpO2 100%   BMI 25.46 kg/m        Objective:   Physical Exam  General Mental Status- Alert. General Appearance- Not in acute distress.   Skin General: Color- Normal Color. Moisture- Normal  Moisture.  Neck Carotid Arteries- Normal color. Moisture- Normal Moisture. No carotid bruits. No JVD.  Chest and Lung Exam Auscultation: Breath Sounds:-Normal.  Cardiovascular Auscultation:Rythm- Regular. Murmurs & Other Heart Sounds:Auscultation of the heart reveals- No Murmurs.  Abdomen Inspection:-Inspeection Normal. Palpation/Percussion:Note:No mass. Palpation and Percussion of the abdomen reveal- faintTender(over lower abd prior surgical site), Non Distended + BS, no rebound or guarding.  Back- no cva tenderness.  Neurologic Cranial Nerve exam:- CN III-XII intact(No nystagmus), symmetric smile. Strength:- 5/5 equal and symmetric strength both upper and lower extremities.   Heent- mild frontal and maxillary sinus pressure.     Assessment & Plan:   Patient Instructions  Recent odor from urine or vaginal area.  Some discharge reported.  Use of boric acid at home and reports most of symptoms now resolved.  Urinalysis does not look very suspicious for UTI presently.  We will go ahead and send out urine culture.  In addition decided to get ancillary studies to include BV and yeast.  Other ancillary studies not indicated.  Will follow results and treat if positive results.  If negative results is possible that you self treated for BV.  Hypertension well controlled presently.  Continue current BP medications.  Since she did report some occasional moderate high readings I want you to check your blood pressures daily over the next 5 days and send me a MyChart message update with those readings.  If elevated then we will need to make modification to current regimen.  Recent sinus pressure with history of allergies this time of year.  We will refill your Flonase.  If signs symptoms worsen or change notify us.  I do want you to get scheduled for early screening for wellness exam.  But will see you sooner if needed.   Time spent with patient today was  30 minutes which consisted of  chart revdew, discussing diagnoses, work up treatment and documentation.

## 2021-02-16 NOTE — Patient Instructions (Signed)
Recent odor from urine or vaginal area.  Some discharge reported.  Use of boric acid at home and reports most of symptoms now resolved.  Urinalysis does not look very suspicious for UTI presently.  We will go ahead and send out urine culture.  In addition decided to get ancillary studies to include BV and yeast.  Other ancillary studies not indicated.  Will follow results and treat if positive results.  If negative results is possible that you self treated for BV.  Hypertension well controlled presently.  Continue current BP medications.  Since she did report some occasional moderate high readings I want you to check your blood pressures daily over the next 5 days and send me a MyChart message update with those readings.  If elevated then we will need to make modification to current regimen.  Recent sinus pressure with history of allergies this time of year.  We will refill your Flonase.  If signs symptoms worsen or change notify us.  I do want you to get scheduled for early screening for wellness exam.  But will see you sooner if needed.

## 2021-02-17 LAB — URINE CULTURE
MICRO NUMBER:: 12554154
Result:: NO GROWTH
SPECIMEN QUALITY:: ADEQUATE

## 2021-02-17 LAB — URINE CYTOLOGY ANCILLARY ONLY
Bacterial Vaginitis-Urine: NEGATIVE
Candida Urine: NEGATIVE

## 2021-02-24 DIAGNOSIS — Z7251 High risk heterosexual behavior: Secondary | ICD-10-CM | POA: Diagnosis not present

## 2021-02-24 DIAGNOSIS — N7689 Other specified inflammation of vagina and vulva: Secondary | ICD-10-CM | POA: Diagnosis not present

## 2021-02-24 DIAGNOSIS — N390 Urinary tract infection, site not specified: Secondary | ICD-10-CM | POA: Diagnosis not present

## 2021-03-10 ENCOUNTER — Ambulatory Visit (INDEPENDENT_AMBULATORY_CARE_PROVIDER_SITE_OTHER): Payer: BC Managed Care – PPO | Admitting: Family Medicine

## 2021-03-10 ENCOUNTER — Other Ambulatory Visit: Payer: Self-pay

## 2021-03-10 ENCOUNTER — Encounter: Payer: Self-pay | Admitting: Family Medicine

## 2021-03-10 ENCOUNTER — Other Ambulatory Visit (HOSPITAL_COMMUNITY)
Admission: RE | Admit: 2021-03-10 | Discharge: 2021-03-10 | Disposition: A | Discharge status: Home / Self Care | Payer: BC Managed Care – PPO | Source: Ambulatory Visit | Attending: Family Medicine | Admitting: Family Medicine

## 2021-03-10 VITALS — BP 133/85 | HR 77 | Ht 65.0 in | Wt 156.0 lb

## 2021-03-10 DIAGNOSIS — Z113 Encounter for screening for infections with a predominantly sexual mode of transmission: Secondary | ICD-10-CM

## 2021-03-10 DIAGNOSIS — Z01419 Encounter for gynecological examination (general) (routine) without abnormal findings: Secondary | ICD-10-CM | POA: Insufficient documentation

## 2021-03-10 DIAGNOSIS — Z1211 Encounter for screening for malignant neoplasm of colon: Secondary | ICD-10-CM

## 2021-03-10 DIAGNOSIS — T8332XA Displacement of intrauterine contraceptive device, initial encounter: Secondary | ICD-10-CM

## 2021-03-10 DIAGNOSIS — R928 Other abnormal and inconclusive findings on diagnostic imaging of breast: Secondary | ICD-10-CM | POA: Diagnosis not present

## 2021-03-10 NOTE — Progress Notes (Signed)
GYNECOLOGY ANNUAL PREVENTATIVE CARE ENCOUNTER NOTE  Subjective:   Debra Ayers is a 50 y.o. G17P2002 female here for a routine annual gynecologic exam.  Current complaints: has history of HMB. Has IUD placed about 2.5 years ago. Doing well - no menses. No vasomotor symptoms. Did have trichomonas about 3 weeks ago. Was treated with flagyl. Would like TOC. Denies abnormal vaginal bleeding, discharge, pelvic pain, problems with intercourse or other gynecologic concerns.    Gynecologic History No LMP recorded. (Menstrual status: IUD). Patient is sexually active  Contraception: IUD Last Pap: about 3 years ago. Results were: normal Last mammogram: 2021. Results were: abnormal Colorectal Cancer Screening: none.   Obstetric History OB History  Gravida Para Term Preterm AB Living  2 2 2     2   SAB IAB Ectopic Multiple Live Births          2    # Outcome Date GA Lbr Len/2nd Weight Sex Delivery Anes PTL Lv  2 Term 2001 [redacted]w[redacted]d   M Vag-Spont None N LIV  1 Term 12 [redacted]w[redacted]d   M Vag-Spont None N LIV    Past Medical History:  Diagnosis Date   Anemia    Heart murmur    Hypertension    medication   Uterine fibroid     Past Surgical History:  Procedure Laterality Date   BACK SURGERY  2004   Benign fatty tumor removed from mid upper back   BREAST ENHANCEMENT SURGERY  2010   BREAST IMPLANT REMOVAL  2013   BREAST SURGERY  2014   Left breast biopsy, benign   LIPOSUCTION  06/2020   TUBAL LIGATION  2001   tummy tuck  14/7829   UMBILICAL HERNIA REPAIR  2018    Current Outpatient Medications on File Prior to Visit  Medication Sig Dispense Refill   amLODipine (NORVASC) 10 MG tablet TAKE 1 TABLET(10 MG) BY MOUTH DAILY 90 tablet 1   fluticasone (FLONASE) 50 MCG/ACT nasal spray Place 2 sprays into both nostrils daily. 16 g 3   losartan (COZAAR) 100 MG tablet TAKE 1 TABLET BY MOUTH  DAILY 90 tablet 3   gabapentin (NEURONTIN) 300 MG capsule Take 1 capsule (300 mg total) by mouth daily for  1 day, THEN 1 capsule (300 mg total) 2 (two) times daily for 1 day, THEN 1 capsule (300 mg total) 3 (three) times daily for 1 day. 87 capsule 0   No current facility-administered medications on file prior to visit.    No Known Allergies  Social History   Socioeconomic History   Marital status: Widowed    Spouse name: Not on file   Number of children: 2   Years of education: Not on file   Highest education level: Not on file  Occupational History    Employer: [employer]  Tobacco Use   Smoking status: Never   Smokeless tobacco: Never  Vaping Use   Vaping Use: Never used  Substance and Sexual Activity   Alcohol use: Yes    Alcohol/week: 3.0 standard drinks    Types: 3 Cans of beer per week   Drug use: No   Sexual activity: Yes    Birth control/protection: I.U.D.  Other Topics Concern   Not on file  Social History Narrative   Has 49 yr old and 49 year old   Works as Dance movement psychotherapist at [employer]   Separated   She completed 2 years college   Enjoys entertaining friends   Social  Determinants of Health   Financial Resource Strain: Not on file  Food Insecurity: Not on file  Transportation Needs: Not on file  Physical Activity: Not on file  Stress: Not on file  Social Connections: Not on file  Intimate Partner Violence: Not on file    Family History  Problem Relation Age of Onset   Arthritis Mother    Osteoporosis Mother    Diabetes Mother    Diabetes Maternal Grandmother    Diabetes Son    Diabetes Maternal Aunt    Diabetes Maternal Uncle     The following portions of the patient's history were reviewed and updated as appropriate: allergies, current medications, past family history, past medical history, past social history, past surgical history and problem list.  Review of Systems Pertinent items are noted in HPI.   Objective:  BP 133/85   Pulse 77   Ht 5\' 5"  (1.651 m)   Wt 156 lb (70.8 kg)   BMI 25.96 kg/m  Wt Readings from Last 3 Encounters:   03/10/21 156 lb (70.8 kg)  02/16/21 153 lb (69.4 kg)  08/19/20 159 lb (72.1 kg)     Chaperone present during exam  CONSTITUTIONAL: Well-developed, well-nourished female in no acute distress.  HENT:  Normocephalic, atraumatic, External right and left ear normal. Oropharynx is clear and moist EYES: Conjunctivae and EOM are normal. Pupils are equal, round, and reactive to light. No scleral icterus.  NECK: Normal range of motion, supple, no masses.  Normal thyroid.   CARDIOVASCULAR: Normal heart rate noted, regular rhythm RESPIRATORY: Clear to auscultation bilaterally. Effort and breath sounds normal, no problems with respiration noted. BREASTS: Symmetric in size. No masses, skin changes, nipple drainage, or lymphadenopathy. ABDOMEN: Soft, normal bowel sounds, no distention noted.  No tenderness, rebound or guarding.  PELVIC: Normal appearing external genitalia; normal appearing vaginal mucosa and cervix.  No abnormal discharge noted.  No IUD strings seen. MUSCULOSKELETAL: Normal range of motion. No tenderness.  No cyanosis, clubbing, or edema.  2+ distal pulses. SKIN: Skin is warm and dry. No rash noted. Not diaphoretic. No erythema. No pallor. NEUROLOGIC: Alert and oriented to person, place, and time. Normal reflexes, muscle tone coordination. No cranial nerve deficit noted. PSYCHIATRIC: Normal mood and affect. Normal behavior. Normal judgment and thought content.  Assessment:  Annual gynecologic examination with pap smear   Plan:  1. Well Woman Exam Will follow up results of pap smear and manage accordingly. Mammogram scheduled STD testing discussed. Patient requested testing - Cytology - PAP( [facility])  2. Colon cancer screening - Ambulatory referral to Gastroenterology  3. Routine screening for STI (sexually transmitted infection) Needs serum testing as has history of recent STI - no longer with that partner. - HIV antibody (with reflex) - Hepatitis C Antibody - Hepatitis  B Surface AntiGEN - RPR  4. Abnormal mammogram - MM Digital Diagnostic Bilat; Future  5. Intrauterine contraceptive device threads lost, initial encounter - US PELVIS TRANSVAGINAL NON-OB (TV ONLY); Future   Routine preventative health maintenance measures emphasized. Please refer to After Visit Summary for other counseling recommendations.    Loma Boston, [place] for [employer]

## 2021-03-11 LAB — HIV ANTIBODY (ROUTINE TESTING W REFLEX): HIV Screen 4th Generation wRfx: NONREACTIVE

## 2021-03-11 LAB — HEPATITIS C ANTIBODY: Hep C Virus Ab: <0.1 s/co ratio (ref 0.0–0.9)

## 2021-03-11 LAB — HEPATITIS B SURFACE ANTIGEN: Hepatitis B Surface Ag: NEGATIVE

## 2021-03-11 LAB — RPR: RPR Ser Ql: NONREACTIVE

## 2021-03-14 ENCOUNTER — Encounter: Payer: Self-pay | Admitting: Family Medicine

## 2021-03-18 LAB — CYTOLOGY - PAP
Adequacy: ABSENT
Chlamydia: NEGATIVE
Comment: NEGATIVE
Comment: NEGATIVE
Comment: NEGATIVE
Comment: NORMAL
Diagnosis: NEGATIVE
High risk HPV: NEGATIVE
Neisseria Gonorrhea: NEGATIVE
Trichomonas: NEGATIVE

## 2021-04-12 ENCOUNTER — Other Ambulatory Visit: Payer: Self-pay | Admitting: Medical

## 2021-05-06 ENCOUNTER — Other Ambulatory Visit: Payer: Self-pay | Admitting: Family Medicine

## 2021-05-06 DIAGNOSIS — R928 Other abnormal and inconclusive findings on diagnostic imaging of breast: Secondary | ICD-10-CM

## 2021-05-08 ENCOUNTER — Other Ambulatory Visit: Payer: Self-pay | Admitting: Medical

## 2021-05-25 ENCOUNTER — Ambulatory Visit
Admission: RE | Admit: 2021-05-25 | Discharge: 2021-05-25 | Disposition: A | Discharge status: Home / Self Care | Payer: BC Managed Care – PPO | Source: Ambulatory Visit | Attending: Family Medicine | Admitting: Family Medicine

## 2021-05-25 ENCOUNTER — Other Ambulatory Visit: Payer: Self-pay

## 2021-05-25 DIAGNOSIS — R928 Other abnormal and inconclusive findings on diagnostic imaging of breast: Secondary | ICD-10-CM

## 2021-05-25 DIAGNOSIS — R922 Inconclusive mammogram: Secondary | ICD-10-CM | POA: Diagnosis not present

## 2021-07-04 ENCOUNTER — Telehealth (INDEPENDENT_AMBULATORY_CARE_PROVIDER_SITE_OTHER): Payer: BC Managed Care – PPO | Admitting: Family Medicine

## 2021-07-04 ENCOUNTER — Encounter: Payer: Self-pay | Admitting: Family Medicine

## 2021-07-04 VITALS — Ht 65.0 in

## 2021-07-04 DIAGNOSIS — J020 Streptococcal pharyngitis: Secondary | ICD-10-CM | POA: Diagnosis not present

## 2021-07-04 DIAGNOSIS — J309 Allergic rhinitis, unspecified: Secondary | ICD-10-CM

## 2021-07-04 LAB — POCT RAPID STREP A (OFFICE): Rapid Strep A Screen: POSITIVE — AB

## 2021-07-04 MED ORDER — PENICILLIN V POTASSIUM 500 MG PO TABS: 500.0000 mg | ORAL_TABLET | Freq: Three times a day (TID) | ORAL | 0 refills | Status: AC

## 2021-07-04 MED ORDER — FLUTICASONE PROPIONATE 50 MCG/ACT NA SUSP: 2.0000 | Freq: Every day | NASAL | 3 refills | Status: AC

## 2021-07-04 NOTE — Progress Notes (Signed)
? ?Established Patient Office Visit ? ?Subjective:  ?Patient ID: Debra Ayers, female    DOB: 03-02-1971  Age: 51 y.o. MRN: [id number] ? ?CC:  ?Chief Complaint  ?Patient presents with  ?? Sore Throat  ?  Sore throat, bones feel achy symptoms started last night.   ? ? ?HPI ?Debra Ayers presents for 1 day history of sore throat with headache, myalgias, decreased appetite.  Patient denies fever or chills.  She denies nasal congestion postnasal drip with cough. ? ?Past Medical History:  ?Diagnosis Date  ?? Anemia   ?? Heart murmur   ?? Hypertension   ? medication  ?? Uterine fibroid   ? ? ?Past Surgical History:  ?Procedure Laterality Date  ?? AUGMENTATION MAMMAPLASTY Bilateral 07/2020  ?? BACK SURGERY  2004  ? Benign fatty tumor removed from mid upper back  ?? BREAST ENHANCEMENT SURGERY  2010  ?? BREAST IMPLANT REMOVAL  2013  ?? BREAST SURGERY  2014  ? Left breast biopsy, benign  ?? LIPOSUCTION  06/2020  ?? TUBAL LIGATION  2001  ?? tummy tuck  06/2020  ?? UMBILICAL HERNIA REPAIR  2018  ? ? ?Family History  ?Problem Relation Age of Onset  ?? Arthritis Mother   ?? Osteoporosis Mother   ?? Diabetes Mother   ?? Diabetes Maternal Aunt   ?? Diabetes Maternal Uncle   ?? Diabetes Maternal Grandmother   ?? Diabetes Son   ?? Breast cancer Neg Hx   ? ? ?Social History  ? ?Socioeconomic History  ?? Marital status: Widowed  ?  Spouse name: Not on file  ?? Number of children: 2  ?? Years of education: Not on file  ?? Highest education level: Not on file  ?Occupational History  ?  Employer: [employer]  ?Tobacco Use  ?? Smoking status: Never  ?? Smokeless tobacco: Never  ?Vaping Use  ?? Vaping Use: Never used  ?Substance and Sexual Activity  ?? Alcohol use: Yes  ?  Alcohol/week: 3.0 standard drinks  ?  Types: 3 Cans of beer per week  ?? Drug use: No  ?? Sexual activity: Yes  ?  Birth control/protection: I.U.D.  ?Other Topics Concern  ?? Not on file  ?Social History Narrative  ? Has 50 yr old and 50 year old  ? Works as  Dance movement psychotherapist at [employer]  ? Separated  ? She completed 2 years college  ? Enjoys entertaining friends  ? ?Social Determinants of Health  ? ?Financial Resource Strain: Not on file  ?Food Insecurity: Not on file  ?Transportation Needs: Not on file  ?Physical Activity: Not on file  ?Stress: Not on file  ?Social Connections: Not on file  ?Intimate Partner Violence: Not on file  ? ? ?Outpatient Medications Prior to Visit  ?Medication Sig Dispense Refill  ?? amLODipine (NORVASC) 10 MG tablet TAKE 1 TABLET BY MOUTH  DAILY 90 tablet 3  ?? losartan (COZAAR) 100 MG tablet TAKE 1 TABLET BY MOUTH  DAILY 90 tablet 3  ?? fluticasone (FLONASE) 50 MCG/ACT nasal spray Place 2 sprays into both nostrils daily. 16 g 3  ?? gabapentin (NEURONTIN) 300 MG capsule Take 1 capsule (300 mg total) by mouth daily for 1 day, THEN 1 capsule (300 mg total) 2 (two) times daily for 1 day, THEN 1 capsule (300 mg total) 3 (three) times daily for 1 day. 87 capsule 0  ? ?No facility-administered medications prior to visit.  ? ? ?No Known Allergies ? ?ROS ?Review of  Systems  ?Constitutional:  Negative for diaphoresis, fatigue, fever and unexpected weight change.  ?HENT:  Positive for sore throat. Negative for congestion, hearing loss, mouth sores, postnasal drip, rhinorrhea, trouble swallowing and voice change.   ?Eyes:  Negative for photophobia and visual disturbance.  ?Respiratory:  Negative for cough.   ?Cardiovascular: Negative.   ?Gastrointestinal:  Negative for nausea and vomiting.  ?Musculoskeletal:  Positive for myalgias. Negative for arthralgias.  ?Neurological:  Positive for headaches. Negative for speech difficulty and weakness.  ? ?  ?Objective:  ?  ?Physical Exam ?Vitals and nursing note reviewed.  ?Constitutional:   ?   Appearance: She is well-developed.  ?HENT:  ?   Head: Normocephalic and atraumatic.  ?Eyes:  ?   Extraocular Movements:  ?   Right eye: Normal extraocular motion.  ?   Left eye: Normal extraocular motion.  ?    Conjunctiva/sclera: Conjunctivae normal.  ?Pulmonary:  ?   Effort: Pulmonary effort is normal.  ?Neurological:  ?   Mental Status: She is alert and oriented to person, place, and time.  ?Psychiatric:     ?   Mood and Affect: Mood normal.     ?   Behavior: Behavior normal.  ? ? ?Ht '5\' 5"'$  (1.651 m)   BMI 25.96 kg/m?  ?Wt Readings from Last 3 Encounters:  ?03/10/21 156 lb (70.8 kg)  ?02/16/21 153 lb (69.4 kg)  ?08/19/20 159 lb (72.1 kg)  ? ? ? ?Health Maintenance Due  ?Topic Date Due  ?? TETANUS/TDAP  Never done  ?? COLONOSCOPY (Pts 45-22yr Insurance coverage will need to be confirmed)  Never done  ? ? ?There are no preventive care reminders to display for this patient. ? ?Lab Results  ?Component Value Date  ? TSH 1.94 11/13/2019  ? ?Lab Results  ?Component Value Date  ? WBC 6.9 02/21/2019  ? HGB 16.0 (H) 02/21/2019  ? HCT 49.4 (H) 02/21/2019  ? MCV 87.1 02/21/2019  ? PLT 221 02/21/2019  ? ?Lab Results  ?Component Value Date  ? NA 136 11/13/2019  ? K 4.4 11/13/2019  ? CO2 25 11/13/2019  ? GLUCOSE 100 (H) 11/13/2019  ? BUN 9 11/13/2019  ? CREATININE 0.78 11/13/2019  ? BILITOT 0.5 11/13/2019  ? ALKPHOS 65 11/13/2019  ? AST 14 11/13/2019  ? ALT 14 11/13/2019  ? PROT 6.7 11/13/2019  ? ALBUMIN 4.5 11/13/2019  ? CALCIUM 9.4 11/13/2019  ? ANIONGAP 12 02/21/2019  ? GFR 78.78 11/13/2019  ? ?Lab Results  ?Component Value Date  ? CHOL 156 11/13/2019  ? ?Lab Results  ?Component Value Date  ? HDL 57.20 11/13/2019  ? ?Lab Results  ?Component Value Date  ? L[place]90 11/13/2019  ? ?Lab Results  ?Component Value Date  ? TRIG 43.0 11/13/2019  ? ?Lab Results  ?Component Value Date  ? CHOLHDL 3 11/13/2019  ? ?Lab Results  ?Component Value Date  ? HGBA1C 5.6 03/27/2017  ? ? ?  ?Assessment & Plan:  ? ?Problem List Items Addressed This Visit   ?None ?Visit Diagnoses   ? ? Pharyngitis due to Streptococcus species    -  Primary  ? Relevant Medications  ? penicillin v potassium (VEETID) 500 MG tablet  ? Other Relevant Orders  ? POCT rapid  strep A  ? [place]  ? Allergic rhinitis, unspecified seasonality, unspecified trigger      ? Relevant Medications  ? fluticasone (FLONASE) 50 MCG/ACT nasal spray  ? ?  ? ? ?Meds ordered this encounter  ?  Medications  ?? fluticasone (FLONASE) 50 MCG/ACT nasal spray  ?  Sig: Place 2 sprays into both nostrils daily.  ?  Dispense:  16 g  ?  Refill:  3  ?? penicillin v potassium (VEETID) 500 MG tablet  ?  Sig: Take 1 tablet (500 mg total) by mouth 3 (three) times daily for 10 days.  ?  Dispense:  30 tablet  ?  Refill:  0  ? ? ?Follow-up: Return in about 3 days (around 07/07/2021), or if symptoms worsen or fail to improve.  ? ?Meets criteria for strep throat.  Treat with penicillin.  Information was given on strep pharyngitis.  She requested COVID test and a refill for Flonase for her allergy rhinitis symptoms.  She will come to the clinic and being swabbed for strep and COVID.  ?Libby Maw, MD ? ? ?.Virtual Visit via Video Note ? ?I connected with Debra Ayers on 07/04/21 at 10:20 AM EDT by a video enabled telemedicine application and verified that I am speaking with the correct person using two identifiers. ? ?Location: ?Patient: home alone in a room.  ?Provider: work ?  ?I discussed the limitations of evaluation and management by telemedicine and the availability of in person appointments. The patient expressed understanding and agreed to proceed. ? ?History of Present Illness: ? ?  ?Observations/Objective: ? ? ?Assessment and Plan: ? ? ?Follow Up Instructions: ? ?  ?I discussed the assessment and treatment plan with the patient. The patient was provided an opportunity to ask questions and all were answered. The patient agreed with the plan and demonstrated an understanding of the instructions. ?  ?The patient was advised to call back or seek an in-person evaluation if the symptoms worsen or if the condition fails to improve as anticipated. ? ?I provided 20 minutes of non-face-to-face time during  this encounter. ? ? ?Libby Maw, MD  ?

## 2021-07-06 ENCOUNTER — Telehealth: Payer: Self-pay | Admitting: Medical

## 2021-07-06 ENCOUNTER — Other Ambulatory Visit: Payer: Self-pay

## 2021-07-06 ENCOUNTER — Ambulatory Visit (INDEPENDENT_AMBULATORY_CARE_PROVIDER_SITE_OTHER): Payer: BC Managed Care – PPO | Admitting: Family Medicine

## 2021-07-06 VITALS — BP 116/78 | HR 93 | Temp 97.4°F | Ht 65.0 in | Wt 151.8 lb

## 2021-07-06 DIAGNOSIS — J020 Streptococcal pharyngitis: Secondary | ICD-10-CM

## 2021-07-06 LAB — NOVEL CORONAVIRUS, NAA: SARS-CoV-2, NAA: NOT DETECTED

## 2021-07-06 MED ORDER — TRAMADOL HCL 50 MG PO TABS
50.0000 mg | ORAL_TABLET | Freq: Three times a day (TID) | ORAL | 0 refills | Status: AC | PRN
Start: 2021-07-06 — End: 2021-07-11

## 2021-07-06 MED ORDER — PREDNISONE 20 MG PO TABS: 20.0000 mg | ORAL_TABLET | Freq: Every day | ORAL | 0 refills | Status: DC

## 2021-07-06 NOTE — Patient Instructions (Signed)
Strep Throat, Adult Strep throat is an infection in the throat that is caused by bacteria. It is common during the cold months of the year. It mostly affects children who are 5-50 years old. However, people of all ages can get it at any time of the year. This infection spreads from person to person (is contagious) through coughing, sneezing, or having close contact. Your health care provider may use other names to describe the infection. When strep throat affects the tonsils, it is called tonsillitis. When it affects the back of the throat, it is called pharyngitis. What are the causes? This condition is caused by the Streptococcus pyogenes bacteria. What increases the risk? You are more likely to develop this condition if: You care for school-age children, or are around school-age children. Children are more likely to get strep throat and may spread it to others. You spend time in crowded places where the infection can spread easily. You have close contact with someone who has strep throat. What are the signs or symptoms? Symptoms of this condition include: Fever or chills. Redness, swelling, or pain in the tonsils or throat. Pain or difficulty when swallowing. White or yellow spots on the tonsils or throat. Tender glands in the neck and under the jaw. Bad smelling breath. Red rash all over the body. This is rare. How is this diagnosed? This condition is diagnosed by tests that check for the presence and the amount of bacteria that cause strep throat. They are: Rapid strep test. Your throat is swabbed and checked for the presence of bacteria. Results are usually ready in minutes. Throat culture test. Your throat is swabbed. The sample is placed in a cup that allows infections to grow. Results are usually ready in 1 or 2 days. How is this treated? This condition may be treated with: Medicines that kill germs (antibiotics). Medicines that relieve pain or fever. These include: Ibuprofen or  acetaminophen. Aspirin, only for people who are over the age of 18. Throat lozenges. Throat sprays. Follow these instructions at home: Medicines  Take over-the-counter and prescription medicines only as told by your health care provider. Take your antibiotic medicine as told by your health care provider. Do not stop taking the antibiotic even if you start to feel better. Eating and drinking  If you have trouble swallowing, try eating soft foods until your sore throat feels better. Drink enough fluid to keep your urine pale yellow. To help relieve pain, you may have: Warm fluids, such as soup and tea. Cold fluids, such as frozen desserts or popsicles. General instructions Gargle with a salt-water mixture 3-4 times a day or as needed. To make a salt-water mixture, completely dissolve -1 tsp (3-6 g) of salt in 1 cup (237 mL) of warm water. Get plenty of rest. Stay home from work or school until you have been taking antibiotics for 24 hours. Do not use any products that contain nicotine or tobacco. These products include cigarettes, chewing tobacco, and vaping devices, such as e-cigarettes. If you need help quitting, ask your health care provider. It is up to you to get your test results. Ask your health care provider, or the department that is doing the test, when your results will be ready. Keep all follow-up visits. This is important. How is this prevented?  Do not share food, drinking cups, or personal items that could cause the infection to spread to other people. Wash your hands often with soap and water for at least 20 seconds. If soap   and water are not available, use hand sanitizer. Make sure that all people in your house wash their hands well. ?Have family members tested if they have a sore throat or fever. They may need an antibiotic if they have strep throat. ?Contact a health care provider if: ?You have swelling in your neck that keeps getting bigger. ?You develop a rash, cough, or  earache. ?You cough up a thick mucus that is green, yellow-brown, or bloody. ?You have pain or discomfort that does not get better with medicine. ?Your symptoms seem to be getting worse. ?You have a fever. ?Get help right away if: ?You have new symptoms, such as vomiting, severe headache, stiff or painful neck, chest pain, or shortness of breath. ?You have severe throat pain, drooling, or changes in your voice. ?You have swelling of the neck, or the skin on the neck becomes red and tender. ?You have signs of dehydration, such as tiredness (fatigue), dry mouth, and decreased urination. ?You become increasingly sleepy, or you cannot wake up completely. ?Your joints become red or painful. ?These symptoms may represent a serious problem that is an emergency. Do not wait to see if the symptoms will go away. Get medical help right away. Call your local emergency services (911 in the [place] Do not drive yourself to the hospital. ?Summary ?Strep throat is an infection in the throat that is caused by the Streptococcus pyogenes bacteria. This infection is spread from person to person (is contagious) through coughing, sneezing, or having close contact. ?Take your medicines, including antibiotics, as told by your health care provider. Do not stop taking the antibiotic even if you start to feel better. ?To prevent the spread of germs, wash your hands well with soap and water. Have others do the same. Do not share food, drinking cups, or personal items. ?Get help right away if you have new symptoms, such as vomiting, severe headache, stiff or painful neck, chest pain, or shortness of breath. ?This information is not intended to replace advice given to you by your health care provider. Make sure you discuss any questions you have with your health care provider. ?Document Revised: 08/03/2020 Document Reviewed: 08/03/2020 ?Elsevier Patient Education ? [place]. ? ?

## 2021-07-06 NOTE — Telephone Encounter (Signed)
Pt had 11:20 today told Dr Gena Fray she had trama dol at home but was mistaken. She would like the script sent in please. ?

## 2021-07-06 NOTE — Progress Notes (Addendum)
?[facility] ?[facility] ?[place] ?[place] [address] ?Dept: [phone] ?Dept Fax: [phone] ? ?Office Visit ? ?Subjective:  ? ? Patient ID: Unknown Jim, female    DOB: 1971/04/03, 51 y.o..   MRN: [id number] ? ?Chief Complaint  ?Patient presents with  ? Acute Visit  ?  C/o having very sore throat, HA, not able to eat. positive for Strep on 07/04/21.   She has taken Tylenol with little relief.   ? ? ?History of Present Illness: ? ?Patient is in today for reassessment of a strep throat infection. Ms. Riggenbach first became sick on Monday. She was seen by a video visit and came to the clinic to have cultures obtained. Her Rapid Strep was positive. She was prescribed penicillin. After two days, she continues to have significant sore throat with difficulty swallowing, pain in the tonsils, swelling over the upper neck, headache, and she has had considerable difficulty sleeping. She is concerned about how she will go back to work tomorrow. She has been taking Tylenol and ibuprofen, but this has not managed the pain. ? ?Past Medical History: ?Patient Active Problem List  ? Diagnosis Date Noted  ? Right elbow pain 11/29/2015  ? Visit for preventive health examination 09/10/2015  ? Chronic idiopathic constipation 08/12/2014  ? Family history of diabetes mellitus 06/25/2013  ? Umbilical hernia, incarcerated 11/20/2012  ? Herpetic whitlow 02/12/2012  ? HTN (hypertension) 02/12/2012  ? Hearing problem 02/12/2012  ? ?Past Surgical History:  ?Procedure Laterality Date  ? AUGMENTATION MAMMAPLASTY Bilateral 07/2020  ? BACK SURGERY  2004  ? Benign fatty tumor removed from mid upper back  ? BREAST ENHANCEMENT SURGERY  2010  ? BREAST IMPLANT REMOVAL  2013  ? BREAST SURGERY  2014  ? Left breast biopsy, benign  ? LIPOSUCTION  06/2020  ? TUBAL LIGATION  2001  ? tummy tuck  06/2020  ? UMBILICAL HERNIA REPAIR  2018  ? ?Family History  ?Problem Relation Age of Onset  ? Arthritis  Mother   ? Osteoporosis Mother   ? Diabetes Mother   ? Diabetes Maternal Aunt   ? Diabetes Maternal Uncle   ? Diabetes Maternal Grandmother   ? Diabetes Son   ? Breast cancer Neg Hx   ? ?Outpatient Medications Prior to Visit  ?Medication Sig Dispense Refill  ? amLODipine (NORVASC) 10 MG tablet TAKE 1 TABLET BY MOUTH  DAILY 90 tablet 3  ? fluticasone (FLONASE) 50 MCG/ACT nasal spray Place 2 sprays into both nostrils daily. 16 g 3  ? losartan (COZAAR) 100 MG tablet TAKE 1 TABLET BY MOUTH  DAILY 90 tablet 3  ? penicillin v potassium (VEETID) 500 MG tablet Take 1 tablet (500 mg total) by mouth 3 (three) times daily for 10 days. 30 tablet 0  ? ?No facility-administered medications prior to visit.  ? ?No Known Allergies ?   ?Objective:  ? ?Today's Vitals  ? 07/06/21 1120  ?BP: 116/78  ?Pulse: 93  ?Temp: (!) 97.4 ?F (36.3 ?C)  ?TempSrc: Temporal  ?SpO2: 94%  ?Weight: 151 lb 12.8 oz (68.9 kg)  ?Height: '5\' 5"'$  (1.651 m)  ? ?Body mass index is 25.26 kg/m?.  ? ?General: Well developed, well nourished. No acute distress. ?HEENT: Normocephalic, non-traumatic. External ears normal. EAC and TMs normal bilaterally.   ? Mucous membranes moist. The tonsils are moderately swollen. There is a large white patch on the  ? surface of the left tonsil. The palate does not show edema or  pointing to suggest an abscess. ?Neck: There is swelling to the submandibular glands in the upper neck. ?Psych: Alert and oriented. Normal mood and affect. ? ?Health Maintenance Due  ?Topic Date Due  ? TETANUS/TDAP  Never done  ? COLONOSCOPY (Pts 45-26yr Insurance coverage will need to be confirmed)  Never done  ? ?Lab Results ?Component Ref Range & Units 2 d ago ?(07/04/21) 6 yr ago ?(04/01/15) 6 yr ago ?(10/05/14)  ?Rapid Strep A Screen Negative Positive Abnormal   Negative  Positive Abnormal    ? ?   ?Assessment & Plan:  ? ?1. Streptococcal pharyngitis ?Ms. GCordyhas a moderately severe strep pharyngitis. I do not see evidence for a peritonsillar abscess,  but cautioned her about signs of this and to return if these occur. I recommend she complete the course of Penicillin. We also discussed warm water gargles. She notes she does have tramadol at home, however, called back later and noted she was mistaken. I will provide a small amount for pain management. I provided a work excuse for the rest of this week. I will have her follow-up with me on Monday to reassess. ? ?- predniSONE (DELTASONE) 20 MG tablet; Take 1 tablet (20 mg total) by mouth daily with breakfast.  Dispense: 5 tablet; Refill: 0 ?- traMADol (ULTRAM) 50 MG tablet; Take 1 tablet (50 mg total) by mouth every 8 (eight) hours as needed for up to 5 days.  Dispense: 15 tablet; Refill: 0 ? ?Return in about 5 days (around 07/11/2021).  ? ?SHaydee Salter MD ?

## 2021-07-06 NOTE — Addendum Note (Signed)
Addended by: Haydee Salter on: 07/06/2021 05:16 PM ? ? Modules accepted: Orders ? ?

## 2021-07-07 ENCOUNTER — Other Ambulatory Visit: Payer: Self-pay

## 2021-07-07 NOTE — Telephone Encounter (Signed)
Patient notified VIA phone.  No questions.  Dm/cma ? ?

## 2021-07-11 ENCOUNTER — Other Ambulatory Visit: Payer: Self-pay

## 2021-07-11 ENCOUNTER — Ambulatory Visit (INDEPENDENT_AMBULATORY_CARE_PROVIDER_SITE_OTHER): Payer: BC Managed Care – PPO | Admitting: Family Medicine

## 2021-07-11 VITALS — BP 132/78 | HR 82 | Temp 97.8°F | Ht 65.0 in | Wt 153.8 lb

## 2021-07-11 DIAGNOSIS — K641 Second degree hemorrhoids: Secondary | ICD-10-CM | POA: Insufficient documentation

## 2021-07-11 DIAGNOSIS — J020 Streptococcal pharyngitis: Secondary | ICD-10-CM | POA: Diagnosis not present

## 2021-07-11 NOTE — Progress Notes (Signed)
?[facility] ?[facility] ?[place] ?[place] [address] ?Dept: [phone] ?Dept Fax: [phone] ? ?Office Visit ? ?Subjective:  ? ? Patient ID: Unknown Jim, female    DOB: 12-Jul-1970, 51 y.o..   MRN: [id number] ? ?Chief Complaint  ?Patient presents with  ? Follow-up  ?  5 day f/u. Feeling better and only having HA at night.  Tramadol working.    ? ? ?History of Present Illness: ? ?Patient is in today for reassessment of her recent severe Strep pharyngitis. I had seen her on 3/15.  She had been seen by a video visit 2 days earlier, came to the clinic to have cultures obtained, and tested positive for Strep. She was prescribed penicillin. After two days, she was still having significant sore throat with difficulty swallowing, pain in the tonsils, swelling over the upper neck, headache, and she considerable difficulty sleeping. I had her continue her penicillin and added a course of prednisone and provided some tramadol for pain. She notes her symptoms are much improved at this point. She did have an "ice pick" headache 2 nights ago, for which she took two tramadols. She has not need the tramadol or had a recurrent headache since. She wonders if she needs an MRI scan. ? ?Past Medical History: ?Patient Active Problem List  ? Diagnosis Date Noted  ? Second degree hemorrhoids 07/11/2021  ? Uterine leiomyoma 08/02/2016  ? Stress incontinence 08/02/2016  ? Right elbow pain 11/29/2015  ? Visit for preventive health examination 09/10/2015  ? Chronic idiopathic constipation 08/12/2014  ? Family history of diabetes mellitus 06/25/2013  ? Umbilical hernia, incarcerated 11/20/2012  ? Herpetic whitlow 02/12/2012  ? HTN (hypertension) 02/12/2012  ? Hearing problem 02/12/2012  ? ?Past Surgical History:  ?Procedure Laterality Date  ? AUGMENTATION MAMMAPLASTY Bilateral 07/2020  ? BACK SURGERY  2004  ? Benign fatty tumor removed from mid upper back  ? BREAST ENHANCEMENT SURGERY   2010  ? BREAST IMPLANT REMOVAL  2013  ? BREAST SURGERY  2014  ? Left breast biopsy, benign  ? LIPOSUCTION  06/2020  ? TUBAL LIGATION  2001  ? tummy tuck  06/2020  ? UMBILICAL HERNIA REPAIR  2018  ? ?Family History  ?Problem Relation Age of Onset  ? Arthritis Mother   ? Osteoporosis Mother   ? Diabetes Mother   ? Diabetes Maternal Aunt   ? Diabetes Maternal Uncle   ? Diabetes Maternal Grandmother   ? Diabetes Son   ? Breast cancer Neg Hx   ? ? ?Outpatient Medications Prior to Visit  ?Medication Sig Dispense Refill  ? amLODipine (NORVASC) 10 MG tablet TAKE 1 TABLET BY MOUTH  DAILY 90 tablet 3  ? fluticasone (FLONASE) 50 MCG/ACT nasal spray Place 2 sprays into both nostrils daily. 16 g 3  ? losartan (COZAAR) 100 MG tablet TAKE 1 TABLET BY MOUTH  DAILY 90 tablet 3  ? penicillin v potassium (VEETID) 500 MG tablet Take 1 tablet (500 mg total) by mouth 3 (three) times daily for 10 days. 30 tablet 0  ? traMADol (ULTRAM) 50 MG tablet Take 1 tablet (50 mg total) by mouth every 8 (eight) hours as needed for up to 5 days. 15 tablet 0  ? predniSONE (DELTASONE) 20 MG tablet Take 1 tablet (20 mg total) by mouth daily with breakfast. 5 tablet 0  ? ?No facility-administered medications prior to visit.  ? ?No Known Allergies ?   ?Objective:  ? ?Today's Vitals  ?  07/11/21 0855  ?BP: 132/78  ?Pulse: 82  ?Temp: 97.8 ?F (36.6 ?C)  ?TempSrc: Temporal  ?SpO2: 98%  ?Weight: 153 lb 12.8 oz (69.8 kg)  ?Height: '5\' 5"'$  (1.651 m)  ? ?Body mass index is 25.59 kg/m?.  ? ?General: Well developed, well nourished. No acute distress. ?HEENT: Normocephalic, non-traumatic. PERRL, EOMI. Conjunctiva clear. Fundiscopic exam shows  ? normal disc and vasculature. External ears normal. EAC and TMs normal bilaterally. Mucous  ? membranes moist. The tonsils have gone down in size and there is no longer a white patch on the  ? right tonsil. Oropharynx clear. Good dentition. ?Neck: Supple. No lymphadenopathy. No thyromegaly. ?Psych: Alert and oriented. Normal mood  and affect. ? ?Health Maintenance Due  ?Topic Date Due  ? TETANUS/TDAP  Never done  ? COLONOSCOPY (Pts 45-61yr Insurance coverage will need to be confirmed)  Never done  ?   ?Assessment & Plan:  ? ?1. Streptococcal pharyngitis ?Improving. I recommend she complete her penicillin course. I see no concerning physical findings related to her headache. I reassured her about this and advised her that she did not need a MRI scan. I will release her to return to work. ? ?Return if symptoms worsen or fail to improve.  ? ?SHaydee Salter MD ?

## 2021-09-05 ENCOUNTER — Ambulatory Visit (INDEPENDENT_AMBULATORY_CARE_PROVIDER_SITE_OTHER): Payer: BC Managed Care – PPO | Admitting: Family Medicine

## 2021-09-05 VITALS — BP 126/80 | HR 73 | Temp 97.0°F | Ht 65.0 in | Wt 160.0 lb

## 2021-09-05 DIAGNOSIS — R238 Other skin changes: Secondary | ICD-10-CM

## 2021-09-05 MED ORDER — DOXYCYCLINE HYCLATE 100 MG PO TABS: 100.0000 mg | ORAL_TABLET | Freq: Two times a day (BID) | ORAL | 0 refills | Status: DC

## 2021-09-05 NOTE — Patient Instructions (Signed)
Apply hot compresses to outer ear for 10 min. 3-4 times a day. ?

## 2021-09-05 NOTE — Progress Notes (Signed)
?[facility] ?[address] ?[place] [address] ?Dept: [phone] ?Dept Fax: [phone] ? ?Office Visit ? ?Subjective:  ? ? Patient ID: Unknown Jim, female    DOB: 1970-06-02, 51 y.o..   MRN: [id number] ? ?Chief Complaint  ?Patient presents with  ? Acute Visit  ?  C/o having RT ear pain x 2 days.    ? ? ?History of Present Illness: ? ?Patient is in today for with a two day history of right ear pain. She notes it is tender if she presses over top of the tragus. She has not had any recent nasal congestion or sore throat. ? ?Past Medical History: ?Patient Active Problem List  ? Diagnosis Date Noted  ? Second degree hemorrhoids 07/11/2021  ? Uterine leiomyoma 08/02/2016  ? Stress incontinence 08/02/2016  ? Right elbow pain 11/29/2015  ? Visit for preventive health examination 09/10/2015  ? Chronic idiopathic constipation 08/12/2014  ? Family history of diabetes mellitus 06/25/2013  ? Umbilical hernia, incarcerated 11/20/2012  ? Herpetic whitlow 02/12/2012  ? HTN (hypertension) 02/12/2012  ? Hearing problem 02/12/2012  ? ?Past Surgical History:  ?Procedure Laterality Date  ? AUGMENTATION MAMMAPLASTY Bilateral 07/2020  ? BACK SURGERY  2004  ? Benign fatty tumor removed from mid upper back  ? BREAST ENHANCEMENT SURGERY  2010  ? BREAST IMPLANT REMOVAL  2013  ? BREAST SURGERY  2014  ? Left breast biopsy, benign  ? LIPOSUCTION  06/2020  ? TUBAL LIGATION  2001  ? tummy tuck  06/2020  ? UMBILICAL HERNIA REPAIR  2018  ? ?Family History  ?Problem Relation Age of Onset  ? Arthritis Mother   ? Osteoporosis Mother   ? Diabetes Mother   ? Diabetes Maternal Aunt   ? Diabetes Maternal Uncle   ? Diabetes Maternal Grandmother   ? Diabetes Son   ? Breast cancer Neg Hx   ? ?Outpatient Medications Prior to Visit  ?Medication Sig Dispense Refill  ? amLODipine (NORVASC) 10 MG tablet TAKE 1 TABLET BY MOUTH  DAILY 90 tablet 3  ? fluticasone (FLONASE) 50 MCG/ACT nasal spray Place 2  sprays into both nostrils daily. 16 g 3  ? losartan (COZAAR) 100 MG tablet TAKE 1 TABLET BY MOUTH  DAILY 90 tablet 3  ? ?No facility-administered medications prior to visit.  ? ?No Known Allergies ?   ?Objective:  ? ?Today's Vitals  ? 09/05/21 1613  ?BP: 126/80  ?Pulse: 73  ?Temp: (!) 97 ?F (36.1 ?C)  ?TempSrc: Temporal  ?SpO2: 99%  ?Weight: 160 lb (72.6 kg)  ?Height: '5\' 5"'$  (1.651 m)  ? ?Body mass index is 26.63 kg/m?.  ? ?General: Well developed, well nourished. No acute distress. ?Ear: There is pain with pressure over the right tragus. There is a raised flesh-colored mass just  ? inside the external auditory meatus along the anterior wall. This is quite tender to touch. ?Psych: Alert and oriented. Normal mood and affect. ? ?Health Maintenance Due  ?Topic Date Due  ? TETANUS/TDAP  Never done  ? COLONOSCOPY (Pts 45-37yr Insurance coverage will need to be confirmed)  Never done  ?   ?Assessment & Plan:  ? ?1. Skin pimple in right ear ?The lesion in the ear is consistent with a pimple. I recommend she apply hot compresses to the outer ear for 10 min. 3-4 times a day. I will prescribe an antibiotic. She shodul follow-up if not improving with this. ? ?- doxycycline (VIBRA-TABS) 100 MG tablet;  Take 1 tablet (100 mg total) by mouth 2 (two) times daily.  Dispense: 14 tablet; Refill: 0 ? ? ?Return if symptoms worsen or fail to improve.  ? ?Haydee Salter, MD ?

## 2021-09-26 ENCOUNTER — Encounter: Payer: Self-pay | Admitting: Family Medicine

## 2021-09-26 ENCOUNTER — Ambulatory Visit (INDEPENDENT_AMBULATORY_CARE_PROVIDER_SITE_OTHER): Payer: BC Managed Care – PPO | Admitting: Family Medicine

## 2021-09-26 VITALS — BP 138/84 | HR 88 | Temp 98.2°F | Ht 64.5 in | Wt 159.2 lb

## 2021-09-26 DIAGNOSIS — H9201 Otalgia, right ear: Secondary | ICD-10-CM

## 2021-09-26 DIAGNOSIS — K5904 Chronic idiopathic constipation: Secondary | ICD-10-CM | POA: Diagnosis not present

## 2021-09-26 DIAGNOSIS — Z23 Encounter for immunization: Secondary | ICD-10-CM

## 2021-09-26 DIAGNOSIS — H938X1 Other specified disorders of right ear: Secondary | ICD-10-CM | POA: Diagnosis not present

## 2021-09-26 DIAGNOSIS — I10 Essential (primary) hypertension: Secondary | ICD-10-CM

## 2021-09-26 DIAGNOSIS — Z131 Encounter for screening for diabetes mellitus: Secondary | ICD-10-CM

## 2021-09-26 DIAGNOSIS — Z1211 Encounter for screening for malignant neoplasm of colon: Secondary | ICD-10-CM

## 2021-09-26 MED ORDER — NEOMYCIN-POLYMYXIN-HC 3.5-10000-1 OT SOLN: 3.0000 [drp] | Freq: Four times a day (QID) | OTIC | 0 refills | Status: DC

## 2021-09-26 NOTE — Progress Notes (Signed)
[facility] [address] [place] [address] Dept: [phone] Dept Fax: [phone]  Transfer of Care Office Visit  Subjective:    Patient ID: Debra Ayers, female    DOB: Apr 13, 1971, 51 y.o..   MRN: [id number]  Chief Complaint  Patient presents with   Establish Care    Patient requesting transfer of care and annual physical. Patient is fasting. Right ear pain for the past 3-4 days. Patient is aware that colonoscopy is due.     History of Present Illness:  Patient is in today to establish care. Debra Ayers was born [place] Her family moved to [place] when she was 51 years old. She became pregnant with her oldest son at age 66. She moved to [place], [place] for 4 years and has been back and forth since. She did have two years of college at [employer] in [place], [place] travel and [place]. She moved to [place] in 07/25/1994. She had two sons with her first husband (now 102, 49). She was divorced and then remarried. Her 2nd husband died in 07/25/2018 from COVID-19. She is single currently.  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 She denies tobacco or drug use. She drinks alcohol on the weekends.  Debra Ayers has a history of hypertension. She is managed on amlodipine 10 mg daily and losartan 100 mg daily. She notes she is not consistent in taking her medication and was off this over the past week while visiting her mother in [place].  Debra Ayers has a history of seasonal allergies. She manages these with Flonase spray.  Debra Ayers has a history of chronic constipation. She notes Miralax is not very helpful for her. She has tried to increase dietary fiber and fluid intake, but finds this challenging.   Debra Ayers was seen 2-3 weeks ago with right ear pain. On exam, she had a small flesh-colored mass just inside the right external auditory meatus. I treated her  with hot compresses and a course of doxycycline. Although this improved, she is back to having ear pain again. She notes her hearing is muffled as well.  Past Medical History: Patient Active Problem List   Diagnosis Date Noted   Second degree hemorrhoids 07/11/2021   Uterine leiomyoma 08/02/2016   Stress incontinence 08/02/2016   Chronic idiopathic constipation 08/12/2014   Family history of diabetes mellitus 06/25/2013   Herpetic whitlow 02/12/2012   Essential hypertension 02/12/2012   Hearing problem 02/12/2012   Past Surgical History:  Procedure Laterality Date   AUGMENTATION MAMMAPLASTY Bilateral 07/2020   BACK SURGERY  July 25, 2002   Benign fatty tumor removed from mid upper back   BREAST ENHANCEMENT SURGERY  2008-07-24   BREAST IMPLANT REMOVAL  07/25/2011   BREAST SURGERY  07-24-2012   Left breast biopsy, benign   LIPOSUCTION  07/24/20   TUBAL LIGATION  1999/07/25   tummy tuck  61/6073   UMBILICAL HERNIA REPAIR  07-24-2016   Family History  Problem Relation Age of Onset   Arthritis Mother    Osteoporosis Mother    Diabetes Mother    Dementia Mother    Hypertension Father    Diabetes Son    Diabetes Maternal Aunt    Diabetes Maternal Uncle    Cancer Maternal Grandmother        Leukemia   Diabetes Maternal Grandmother    Breast cancer Neg Hx    Outpatient Medications Prior to  Visit  Medication Sig Dispense Refill   amLODipine (NORVASC) 10 MG tablet TAKE 1 TABLET BY MOUTH  DAILY 90 tablet 3   fluticasone (FLONASE) 50 MCG/ACT nasal spray Place 2 sprays into both nostrils daily. 16 g 3   losartan (COZAAR) 100 MG tablet TAKE 1 TABLET BY MOUTH  DAILY 90 tablet 3   doxycycline (VIBRA-TABS) 100 MG tablet Take 1 tablet (100 mg total) by mouth 2 (two) times daily. (Patient not taking: Reported on 09/26/2021) 14 tablet 0   No facility-administered medications prior to visit.   No Known Allergies    Objective:   Today's Vitals   09/26/21 1311  BP: 138/84  Pulse: 88  Temp: 98.2 F (36.8 C)  TempSrc:  Temporal  SpO2: 97%  Weight: 159 lb 3.2 oz (72.2 kg)  Height: 5' 4.5" (1.638 m)  PainSc: 8   PainLoc: Ear   Body mass index is 26.9 kg/m.   General: Well developed, well nourished. No acute distress. Right Ear- There remains a flesh-colored mass int he right external ear canal near the external auditory   meatus. This appears larger than 2 weeks ago. It remains tender. It is almost entirely blocking the ear canal.  Psych: Alert and oriented. Normal mood and affect.  Health Maintenance Due  Topic Date Due   COVID-19 Vaccine (1) Never done   COLONOSCOPY (Pts 45-35yr Insurance coverage will need to be confirmed)  Never done   Zoster Vaccines- Shingrix (1 of 2) Never done     Assessment & Plan:   1. Essential hypertension Debra Ayers BP remains a bit high, but she had been off of her medication for several days. We did discuss approaches to increase her adherence to her medication plan. I will have her continue amlodipine 10 mg daily and losartan 100 mg daily.  - Basic metabolic panel  2. Chronic idiopathic constipation I provided [name] information on other approaches to dietary fiber to see if we can improve her regularity.  3. Right ear pain 4. Mass of right ear canal I am unsure what the mass int he ear canal may represent. As she appears to have some external otitis related to this, I will prescribe some cortisporin drops. I will refer her to ENT to assess.  - neomycin-polymyxin-hydrocortisone (CORTISPORIN) OTIC solution; Place 3 drops into the right ear 4 (four) times daily.  Dispense: 10 mL; Refill: 0 - Ambulatory referral to ENT  5. Screening for diabetes mellitus (DM)  - Hemoglobin A1c  6. Need for shingles vaccine  - Varicella-zoster vaccine IM (Shingrix)  7. Screening for colon cancer  - Ambulatory referral to Gastroenterology   Return in about 3 months (around 12/27/2021) for Reassessment.   SHaydee Salter MD

## 2021-09-26 NOTE — Patient Instructions (Signed)

## 2021-09-27 LAB — BASIC METABOLIC PANEL
BUN: 11 mg/dL (ref 6–23)
CO2: 26 mEq/L (ref 19–32)
Calcium: 9.7 mg/dL (ref 8.4–10.5)
Chloride: 103 mEq/L (ref 96–112)
Creatinine, Ser: 0.86 mg/dL (ref 0.40–1.20)
GFR: 79 mL/min (ref >60.00)
Glucose, Bld: 88 mg/dL (ref 70–99)
Potassium: 3.9 mEq/L (ref 3.5–5.1)
Sodium: 138 mEq/L (ref 135–145)

## 2021-09-27 LAB — HEMOGLOBIN A1C: Hgb A1c MFr Bld: 5.9 % (ref 4.6–6.5)

## 2021-10-13 ENCOUNTER — Other Ambulatory Visit: Payer: Self-pay | Admitting: Medical

## 2021-10-13 DIAGNOSIS — H60331 Swimmer's ear, right ear: Secondary | ICD-10-CM | POA: Diagnosis not present

## 2021-10-13 DIAGNOSIS — H903 Sensorineural hearing loss, bilateral: Secondary | ICD-10-CM | POA: Diagnosis not present

## 2021-10-13 DIAGNOSIS — H6121 Impacted cerumen, right ear: Secondary | ICD-10-CM | POA: Diagnosis not present

## 2021-12-22 ENCOUNTER — Telehealth: Payer: Self-pay | Admitting: Family Medicine

## 2021-12-22 NOTE — Telephone Encounter (Signed)
FYI:Pt called in complaining of chest pain and SOB three or four days ago. I transferred her over to our nurse triage.

## 2021-12-23 NOTE — Telephone Encounter (Signed)
Lft VM to rtn call to schedule an appointment with one of our providers based on Triage note to f/u on SOB/chest pain 3 days ago with no symptoms now.   Dm/cma

## 2021-12-27 ENCOUNTER — Ambulatory Visit (INDEPENDENT_AMBULATORY_CARE_PROVIDER_SITE_OTHER): Payer: BC Managed Care – PPO

## 2021-12-27 DIAGNOSIS — Z23 Encounter for immunization: Secondary | ICD-10-CM | POA: Diagnosis not present

## 2021-12-27 NOTE — Progress Notes (Signed)
Per orders of Dr. Gena Fray, pt is here for 2nd dose of Shingrix vaccine, pt received injection IM in the LT deltoid @, given by Leonor Liv, CMA. Pt tolerated injection well. Pt was instructed to report any adverse reactions to me immediately.   Sw, cma

## 2022-01-04 ENCOUNTER — Ambulatory Visit: Payer: BC Managed Care – PPO | Admitting: Family Medicine

## 2022-01-04 ENCOUNTER — Telehealth: Payer: Self-pay | Admitting: Family Medicine

## 2022-01-04 NOTE — Telephone Encounter (Signed)
9.13.23 no show letter sent

## 2022-01-05 NOTE — Telephone Encounter (Signed)
1st no show, fee waived, letter sent for pt to call and reschedule

## 2022-01-06 DIAGNOSIS — J069 Acute upper respiratory infection, unspecified: Secondary | ICD-10-CM | POA: Diagnosis not present

## 2022-01-06 DIAGNOSIS — R051 Acute cough: Secondary | ICD-10-CM | POA: Diagnosis not present

## 2022-01-06 DIAGNOSIS — R509 Fever, unspecified: Secondary | ICD-10-CM | POA: Diagnosis not present

## 2022-01-06 DIAGNOSIS — Z20822 Contact with and (suspected) exposure to covid-19: Secondary | ICD-10-CM | POA: Diagnosis not present

## 2022-01-26 ENCOUNTER — Ambulatory Visit (INDEPENDENT_AMBULATORY_CARE_PROVIDER_SITE_OTHER): Payer: BC Managed Care – PPO | Admitting: Family Medicine

## 2022-01-26 ENCOUNTER — Encounter: Payer: Self-pay | Admitting: Family Medicine

## 2022-01-26 VITALS — BP 126/78 | HR 74 | Temp 97.0°F | Ht 64.5 in | Wt 166.0 lb

## 2022-01-26 DIAGNOSIS — B351 Tinea unguium: Secondary | ICD-10-CM

## 2022-01-26 DIAGNOSIS — I10 Essential (primary) hypertension: Secondary | ICD-10-CM | POA: Diagnosis not present

## 2022-01-26 LAB — ALT: ALT: 25 U/L (ref 0–35)

## 2022-01-26 LAB — AST: AST: 17 U/L (ref 0–37)

## 2022-01-26 MED ORDER — TERBINAFINE HCL 250 MG PO TABS: ORAL_TABLET | ORAL | 0 refills | Status: DC

## 2022-01-26 NOTE — Progress Notes (Signed)
[facility] [address] [place] [address] Dept: [phone] Dept Fax: [phone]  Office Visit  Subjective:    Patient ID: Unknown Jim, female    DOB: 02-26-1971, 51 y.o..   MRN: [id number]  Chief Complaint  Patient presents with   Acute Visit    C/o having toenail fungus. Has tried OTC meds with little relief.   Declines flu shot.     History of Present Illness:  Patient is in today with a concern about toenail fungus. She notes this has been gradually progressive, involving more nails. She has tried multiple topical approaches, including OTC Lamisil cream. She has seen no improvements with these approaches.  Past Medical History: Patient Active Problem List   Diagnosis Date Noted   Second degree hemorrhoids 07/11/2021   Uterine leiomyoma 08/02/2016   Stress incontinence 08/02/2016   Chronic idiopathic constipation 08/12/2014   Family history of diabetes mellitus 06/25/2013   Herpetic whitlow 02/12/2012   Essential hypertension 02/12/2012   Hearing problem 02/12/2012   Past Surgical History:  Procedure Laterality Date   AUGMENTATION MAMMAPLASTY Bilateral 07/2020   BACK SURGERY  2004   Benign fatty tumor removed from mid upper back   BREAST ENHANCEMENT SURGERY  2010   BREAST IMPLANT REMOVAL  2013   BREAST SURGERY  2014   Left breast biopsy, benign   LIPOSUCTION  06/2020   TUBAL LIGATION  2001   tummy tuck  27/0623   UMBILICAL HERNIA REPAIR  2018   Family History  Problem Relation Age of Onset   Arthritis Mother    Osteoporosis Mother    Diabetes Mother    Dementia Mother    Hypertension Father    Diabetes Son    Diabetes Maternal Aunt    Diabetes Maternal Uncle    Cancer Maternal Grandmother        Leukemia   Diabetes Maternal Grandmother    Breast cancer Neg Hx    Outpatient Medications Prior to Visit  Medication Sig Dispense Refill   amLODipine (NORVASC) 10 MG tablet TAKE 1 TABLET BY  MOUTH  DAILY 90 tablet 3   fluticasone (FLONASE) 50 MCG/ACT nasal spray Place 2 sprays into both nostrils daily. 16 g 3   levonorgestrel (MIRENA) 20 MCG/DAY IUD 1 each by Intrauterine route once.     losartan (COZAAR) 100 MG tablet TAKE 1 TABLET BY MOUTH  DAILY 90 tablet 3   doxycycline (VIBRA-TABS) 100 MG tablet Take 1 tablet (100 mg total) by mouth 2 (two) times daily. (Patient not taking: Reported on 09/26/2021) 14 tablet 0   neomycin-polymyxin-hydrocortisone (CORTISPORIN) OTIC solution Place 3 drops into the right ear 4 (four) times daily. 10 mL 0   No facility-administered medications prior to visit.   No Known Allergies    Objective:   Today's Vitals   01/26/22 0807  BP: 126/78  Pulse: 74  Temp: (!) 97 F (36.1 C)  TempSrc: Temporal  SpO2: 99%  Weight: 166 lb (75.3 kg)  Height: 5' 4.5" (1.638 m)   Body mass index is 28.05 kg/m.   General: Well developed, well nourished. No acute distress. Nails: Multiple nails on both feet show yellow discoloration, thickening and crumbling of the nails. Psych: Alert and oriented. Normal mood and affect.  Health Maintenance Due  Topic Date Due   COLONOSCOPY (Pts 45-39yr Insurance coverage will need to be confirmed)  Never done     Assessment & Plan:   1. Onychomycosis I will check  transaminase levels. I will treat her fungus with terbinafine pulsed-therapy for 6 months. I will plan to see her back in 3 months to reassess.  - ALT - AST - terbinafine (LAMISIL) 250 MG tablet; Take 1 tablet (250 mg total) by mouth daily for 7 days. Repeat every 4 weeks for 6 months.  Dispense: 42 tablet; Refill: 0  2. Essential hypertension Blood pressure is in good control. Continue amlodipine 10 mg and losartan 100 mg daily.  Return in about 3 months (around 04/28/2022) for Reassessment.   Haydee Salter, MD

## 2022-03-28 ENCOUNTER — Encounter: Payer: Self-pay | Admitting: Family Medicine

## 2022-03-28 ENCOUNTER — Ambulatory Visit (INDEPENDENT_AMBULATORY_CARE_PROVIDER_SITE_OTHER): Payer: BC Managed Care – PPO | Admitting: Family Medicine

## 2022-03-28 VITALS — BP 126/78 | HR 91 | Temp 97.4°F | Ht 64.5 in | Wt 169.6 lb

## 2022-03-28 DIAGNOSIS — Z1211 Encounter for screening for malignant neoplasm of colon: Secondary | ICD-10-CM | POA: Diagnosis not present

## 2022-03-28 DIAGNOSIS — M25472 Effusion, left ankle: Secondary | ICD-10-CM | POA: Diagnosis not present

## 2022-03-28 DIAGNOSIS — G4700 Insomnia, unspecified: Secondary | ICD-10-CM | POA: Diagnosis not present

## 2022-03-28 DIAGNOSIS — M25471 Effusion, right ankle: Secondary | ICD-10-CM

## 2022-03-28 DIAGNOSIS — R011 Cardiac murmur, unspecified: Secondary | ICD-10-CM | POA: Insufficient documentation

## 2022-03-28 MED ORDER — HYDROXYZINE PAMOATE 25 MG PO CAPS: 25.0000 mg | ORAL_CAPSULE | Freq: Every evening | ORAL | 0 refills | Status: DC | PRN

## 2022-03-28 NOTE — Progress Notes (Signed)
[facility] [address] [place] [address] Dept: [phone] Dept Fax: [phone]  Office Visit  Subjective:    Patient ID: Unknown Jim, female    DOB: 1971-03-19, 51 y.o..   MRN: [id number]  Chief Complaint  Patient presents with   Acute Visit    C/o swelling in bilateral ankles x 4 months.      History of Present Illness:  Patient is in today complaining of a 4 month history of intermittent swelling to her ankles. She  notes this occurs intermittently. She states the swelling is localized to the lateral aspect of the ankle. She has not seen swelling of the lower leg. She does some associated achiness in the ankle. She has never seen the joint become red. She denies any past significant injury to the ankle. She has no dyspnea, orthopnea, or PND. She does have a long-standing history of a heart murmur. She has not taken any specific medicines or tried any specific therapies for this.  Past Medical History: Patient Active Problem List   Diagnosis Date Noted   Second degree hemorrhoids 07/11/2021   Uterine leiomyoma 08/02/2016   Stress incontinence 08/02/2016   Chronic idiopathic constipation 08/12/2014   Family history of diabetes mellitus 06/25/2013   Herpetic whitlow 02/12/2012   Essential hypertension 02/12/2012   Hearing problem 02/12/2012   Past Surgical History:  Procedure Laterality Date   AUGMENTATION MAMMAPLASTY Bilateral 07/2020   BACK SURGERY  2004   Benign fatty tumor removed from mid upper back   BREAST ENHANCEMENT SURGERY  2010   BREAST IMPLANT REMOVAL  2013   BREAST SURGERY  2014   Left breast biopsy, benign   LIPOSUCTION  06/2020   TUBAL LIGATION  2001   tummy tuck  38/1017   UMBILICAL HERNIA REPAIR  2018   Family History  Problem Relation Age of Onset   Arthritis Mother    Osteoporosis Mother    Diabetes Mother    Dementia Mother    Hypertension Father    Diabetes Son     Diabetes Maternal Aunt    Diabetes Maternal Uncle    Cancer Maternal Grandmother        Leukemia   Diabetes Maternal Grandmother    Breast cancer Neg Hx    Outpatient Medications Prior to Visit  Medication Sig Dispense Refill   amLODipine (NORVASC) 10 MG tablet TAKE 1 TABLET BY MOUTH  DAILY 90 tablet 3   fluticasone (FLONASE) 50 MCG/ACT nasal spray Place 2 sprays into both nostrils daily. 16 g 3   levonorgestrel (MIRENA) 20 MCG/DAY IUD 1 each by Intrauterine route once.     losartan (COZAAR) 100 MG tablet TAKE 1 TABLET BY MOUTH  DAILY 90 tablet 3   terbinafine (LAMISIL) 250 MG tablet Take 1 tablet (250 mg total) by mouth daily for 7 days. Repeat every 4 weeks for 6 months. 42 tablet 0   No facility-administered medications prior to visit.   No Known Allergies    Objective:   Today's Vitals   03/28/22 1450  BP: 126/78  Pulse: 91  Temp: (!) 97.4 F (36.3 C)  TempSrc: Temporal  SpO2: 99%  Weight: 169 lb 9.6 oz (76.9 kg)  Height: 5' 4.5" (1.638 m)   Body mass index is 28.66 kg/m.   General: Well developed, well nourished. No acute distress. Lungs: Clear to auscultation bilaterally. No wheezing, rales or rhonchi. CV: RRR without murmurs or rubs. Pulses 2+ bilaterally. Extremities: Full ROM.  There is localized puffy swelling just anterior to the lateral malleolus   bilaterally. There is mild tenderness present. No lower leg edema. No redness is apparent. Psych: Alert and oriented. Normal mood and affect.  Health Maintenance Due  Topic Date Due   DTaP/Tdap/Td (1 - Tdap) Never done   COLONOSCOPY (Pts 45-40yr Insurance coverage will need to be confirmed)  Never done     Assessment & Plan:   1. Swelling of both ankles The etiology of the swelling is uncertain. I do not believe this is of a cardiac or venous etiology. I recommend Ms. GRennerttry using an elastic ankle support to see if this will resolve the swelling. I will refer her to Sports Medicine to evaluate if there  could be some joint issue causing the swelling.  - Ambulatory referral to Gastroenterology  2. Insomnia, unspecified type Ms. GMaskmentions some issues with sleep Had been on trazodone int he past, but this did not help. She does not want to be on anything as strong as Ambien. We will give a trial of hydroxyzine.  - hydrOXYzine (VISTARIL) 25 MG capsule; Take 1 capsule (25 mg total) by mouth at bedtime as needed.  Dispense: 30 capsule; Refill: 0  3. Screening for colon cancer  - Ambulatory referral to Sports Medicine  Return if symptoms worsen or fail to improve.   SHaydee Salter MD

## 2022-04-05 NOTE — Progress Notes (Signed)
    Debra Ayers D.Dell Heber Phone: [phone]   Assessment and Plan:     There are no diagnoses linked to this encounter.  ***   Pertinent previous records reviewed include ***   Follow Up: ***     Subjective:   I, Debra Ayers, am serving as a Education administrator for Doctor Glennon Mac  Chief Complaint: bilateral ankle pain   HPI:  04/06/2022 Patient is a 51 year old female complaining of bilateral ankle pain. Patient states  Relevant Historical Information: ***  Additional pertinent review of systems negative.   Current Outpatient Medications:    amLODipine (NORVASC) 10 MG tablet, TAKE 1 TABLET BY MOUTH  DAILY, Disp: 90 tablet, Rfl: 3   fluticasone (FLONASE) 50 MCG/ACT nasal spray, Place 2 sprays into both nostrils daily., Disp: 16 g, Rfl: 3   hydrOXYzine (VISTARIL) 25 MG capsule, Take 1 capsule (25 mg total) by mouth at bedtime as needed., Disp: 30 capsule, Rfl: 0   levonorgestrel (MIRENA) 20 MCG/DAY IUD, 1 each by Intrauterine route once., Disp: , Rfl:    losartan (COZAAR) 100 MG tablet, TAKE 1 TABLET BY MOUTH  DAILY, Disp: 90 tablet, Rfl: 3   terbinafine (LAMISIL) 250 MG tablet, Take 1 tablet (250 mg total) by mouth daily for 7 days. Repeat every 4 weeks for 6 months., Disp: 42 tablet, Rfl: 0   Objective:     There were no vitals filed for this visit.    There is no height or weight on file to calculate BMI.    Physical Exam:    ***   Electronically signed by:  Debra Ayers D.[facility] 11:38 AM 04/05/22

## 2022-04-06 ENCOUNTER — Ambulatory Visit (INDEPENDENT_AMBULATORY_CARE_PROVIDER_SITE_OTHER): Payer: BC Managed Care – PPO | Admitting: Sports Medicine

## 2022-04-06 ENCOUNTER — Ambulatory Visit (INDEPENDENT_AMBULATORY_CARE_PROVIDER_SITE_OTHER): Payer: BC Managed Care – PPO

## 2022-04-06 VITALS — BP 138/78 | Ht 64.0 in | Wt 167.0 lb

## 2022-04-06 DIAGNOSIS — M25571 Pain in right ankle and joints of right foot: Secondary | ICD-10-CM

## 2022-04-06 DIAGNOSIS — G8929 Other chronic pain: Secondary | ICD-10-CM

## 2022-04-06 DIAGNOSIS — Q6672 Congenital pes cavus, left foot: Secondary | ICD-10-CM

## 2022-04-06 DIAGNOSIS — M25572 Pain in left ankle and joints of left foot: Secondary | ICD-10-CM

## 2022-04-06 DIAGNOSIS — Q6671 Congenital pes cavus, right foot: Secondary | ICD-10-CM | POA: Diagnosis not present

## 2022-04-06 DIAGNOSIS — M7989 Other specified soft tissue disorders: Secondary | ICD-10-CM | POA: Diagnosis not present

## 2022-04-06 MED ORDER — MELOXICAM 15 MG PO TABS: 15.0000 mg | ORAL_TABLET | Freq: Every day | ORAL | 0 refills | Status: DC

## 2022-04-06 NOTE — Patient Instructions (Addendum)
Good to see you - Start meloxicam 15 mg daily x2 weeks.  If still having pain after 2 weeks, complete 3rd-week of meloxicam. May use remaining meloxicam as needed once daily for pain control.  Do not to use additional NSAIDs while taking meloxicam.  May use Tylenol [phone] mg 2 to 3 times a day for breakthrough pain. Ankle HEP Recommend [employer] to discuss arch support for shoes  Pt referral  3-4 week follow up

## 2022-04-11 ENCOUNTER — Other Ambulatory Visit: Payer: Self-pay | Admitting: Medical

## 2022-04-12 ENCOUNTER — Ambulatory Visit: Payer: BC Managed Care – PPO | Attending: Sports Medicine

## 2022-04-12 DIAGNOSIS — R2681 Unsteadiness on feet: Secondary | ICD-10-CM | POA: Insufficient documentation

## 2022-04-12 DIAGNOSIS — M25572 Pain in left ankle and joints of left foot: Secondary | ICD-10-CM | POA: Diagnosis not present

## 2022-04-12 DIAGNOSIS — M25571 Pain in right ankle and joints of right foot: Secondary | ICD-10-CM | POA: Diagnosis not present

## 2022-04-12 DIAGNOSIS — G8929 Other chronic pain: Secondary | ICD-10-CM | POA: Diagnosis not present

## 2022-04-12 DIAGNOSIS — M6281 Muscle weakness (generalized): Secondary | ICD-10-CM | POA: Diagnosis not present

## 2022-04-12 NOTE — Therapy (Signed)
OUTPATIENT PHYSICAL THERAPY LOWER EXTREMITY EVALUATION   Patient Name: Debra Ayers MRN: [id number] DOB:[birth year], 51 y.o., female Today's Date: 04/12/2022  END OF SESSION:  PT End of Session - 04/12/22 1604     Visit Number 1    Number of Visits 17    Date for PT Re-Evaluation 06/07/22    PT Start Time 1530    PT Stop Time 1602    PT Time Calculation (min) 32 min    Activity Tolerance Patient tolerated treatment well    Behavior During Therapy WFL for tasks assessed/performed             Past Medical History:  Diagnosis Date   Anemia    Heart murmur    Hypertension    medication   Uterine fibroid    Past Surgical History:  Procedure Laterality Date   AUGMENTATION MAMMAPLASTY Bilateral 07/2020   BACK SURGERY  2004   Benign fatty tumor removed from mid upper back   BREAST ENHANCEMENT SURGERY  2010   BREAST IMPLANT REMOVAL  2013   BREAST SURGERY  2014   Left breast biopsy, benign   LIPOSUCTION  06/2020   TUBAL LIGATION  2001   tummy tuck  92/4268   UMBILICAL HERNIA REPAIR  2018   Patient Active Problem List   Diagnosis Date Noted   Heart murmur 03/28/2022   Second degree hemorrhoids 07/11/2021   Uterine leiomyoma 08/02/2016   Stress incontinence 08/02/2016   Chronic idiopathic constipation 08/12/2014   Family history of diabetes mellitus 06/25/2013   Herpetic whitlow 02/12/2012   Essential hypertension 02/12/2012   Hearing problem 02/12/2012    PCP:  Haydee Salter, MD  REFERRING PROVIDER: Glennon Mac, DO  REFERRING DIAG: M25.571,G89.29,M25.572 (ICD-10-CM) - Chronic pain of both ankles   THERAPY DIAG:  Pain in right ankle and joints of right foot - Plan: PT plan of care cert/re-cert  Muscle weakness (generalized) - Plan: PT plan of care cert/re-cert  Unsteadiness on feet - Plan: PT plan of care cert/re-cert  Rationale for Evaluation and Treatment: Rehabilitation  ONSET DATE: 4 months  SUBJECTIVE:   SUBJECTIVE STATEMENT: Pt  presents to PT with reports of bilateral ankle pain and swelling, R>L. Denies trauma or MOI, notes swelling in lateral ankle. Pt has no N/T in foot/ankle, has increase in pain with driving and prolonged standing. Mainly wears sandals and crocs for work.   PERTINENT HISTORY: HTN  PAIN:  Are you having pain?  Yes: NPRS scale: 7/10 Worst: 9/10 Pain location: lateral ankles R>L Pain description: sharp Aggravating factors: driving, standing Relieving factors: rest  PRECAUTIONS: None  WEIGHT BEARING RESTRICTIONS: No  FALLS:  Has patient fallen in last 6 months? Yes. Number of falls - one   LIVING ENVIRONMENT: Lives with: lives with their family Lives in: House/apartment  OCCUPATION: works for [employer]  PLOF: Independent and Independent with basic ADLs  PATIENT GOALS: decrease pain with work and driving  NEXT MD VISIT:   OBJECTIVE:   DIAGNOSTIC FINDINGS:   See imaging  PATIENT SURVEYS:  FOTO: 59% function; 75% predicted   COGNITION: Overall cognitive status: Within functional limits for tasks assessed     SENSATION: WFL  POSTURE: rounded shoulders and forward head  PALPATION: TTP to lateral R ankle  LOWER EXTREMITY ROM:  Active ROM Right eval Left eval  Hip flexion    Hip extension    Hip abduction    Hip adduction    Hip internal rotation    Hip  external rotation    Knee flexion    Knee extension    Ankle dorsiflexion -6 -5  Ankle plantarflexion    Ankle inversion    Ankle eversion     (Blank rows = not tested)  LOWER EXTREMITY MMT:  MMT Right eval Left eval  Hip flexion    Hip extension    Hip abduction    Hip adduction    Hip internal rotation    Hip external rotation    Knee flexion    Knee extension    Ankle dorsiflexion 5 5  Ankle plantarflexion 5 5  Ankle inversion 3+ p! 4  Ankle eversion 4 p! 4   (Blank rows = not tested)  LOWER EXTREMITY SPECIAL TESTS:  DNT  FUNCTIONAL TESTS:  30 Second Sit to Stand: 10 reps SLS: R 9  seconds; L 8 seconds  GAIT: Distance walked: 85f Assistive device utilized: None Level of assistance: Complete Independence Comments: antalgic gait R   TREATMENT: [facility] Adult PT Treatment:                                                DATE: 04/12/2022 Therapeutic Exercise: Calf stretch with towel x 30" each Tennis ball calf mobilization x 30" Ankle inv/ev x 10 RTB Tandem stance x 30" each  PATIENT EDUCATION:  Education details: eval findings, FOTO, HEP, POC Person educated: Patient Education method: Explanation, Demonstration, and Handouts Education comprehension: verbalized understanding and returned demonstration  HOME EXERCISE PROGRAM: Access Code: [email or web address]: [email or web address] Date: 04/12/2022 Prepared by: DOctavio Manns Exercises - Long Sitting Calf Stretch with Strap  - 1 x daily - 7 x weekly - 2 reps - 30 sec hold - Calf Mobilization with Small Ball  - 1 x daily - 7 x weekly - 2 reps - 1-2 min hold - Seated Ankle Inversion with Resistance and Legs Crossed  - 1 x daily - 7 x weekly - 2 sets - 10 reps - red theraband hold - Long Sitting Ankle Eversion with Resistance  - 1 x daily - 7 x weekly - 2 sets - 10 reps - red theraband hold - Standing Tandem Balance with Counter Support  - 1 x daily - 7 x weekly - 2 sets - 30 sec hold  ASSESSMENT:  CLINICAL IMPRESSION: Patient is a 51y.o. F who was seen today for physical therapy evaluation and treatment for acute on chronic ankle pain and discomfort. Physical findings are consistent with physician impression as pt demonstrates decrease in functional mobility and balance. Her FOTO indicates decrease in functional ability below PLOF. She would benefit from skilled PT services working on improving bilateral ankle strength/stability and normalizing calf length.    OBJECTIVE IMPAIRMENTS: Abnormal gait, decreased activity tolerance, decreased balance, decreased ROM, decreased strength, and pain.   ACTIVITY  LIMITATIONS: standing and transfers  PARTICIPATION LIMITATIONS: driving and occupation  PERSONAL FACTORS: Fitness and 1-2 comorbidities: HTN   are also affecting patient's functional outcome.   REHAB POTENTIAL: Excellent  CLINICAL DECISION MAKING: Stable/uncomplicated  EVALUATION COMPLEXITY: Low   GOALS: Goals reviewed with patient? No  SHORT TERM GOALS: Target date: 05/03/2022   Pt will be compliant and knowledgeable with initial HEP for improved comfort and carryover Baseline: initial HEP given  Goal status: INITIAL  2.  Pt will self report ankle pain no greater than 6/10 for improved  comfort and functional ability Baseline: 9/10 at worst Goal status: INITIAL   LONG TERM GOALS: Target date: 06/07/2022   Pt will self report ankle pain no greater than 3/10 for improved comfort and functional ability Baseline: 9/10 at worst Goal status: INITIAL   2.  Pt will improve FOTO function score to no less than 75% as proxy for functional improvement Baseline: 59% function Goal status: INITIAL   3.  Pt will increase 30 Second Sit to Stand rep count to no less than 12 reps for improved balance, strength, and functional mobility Baseline: 10 reps  Goal status: INITIAL   4.  Pt will improve bilateral SLS time to no less than 30 seconds for improved balance and functional mobility Baseline: R 9 seconds; L 8 seconds Goal status: INITIAL  PLAN:  PT FREQUENCY: 1-2x/week  PT DURATION: 8 weeks  PLANNED INTERVENTIONS: Therapeutic exercises, Therapeutic activity, Neuromuscular re-education, Balance training, Gait training, Patient/Family education, Self Care, Joint mobilization, Dry Needling, Electrical stimulation, Cryotherapy, Moist heat, Vasopneumatic device, Manual therapy, and Re-evaluation  PLAN FOR NEXT SESSION: assess HEP response, ankle strengthening, balance work   Ward Chatters, PT 04/12/2022, 4:14 PM

## 2022-04-25 NOTE — Therapy (Signed)
OUTPATIENT PHYSICAL THERAPY TREATMENT NOTE   Patient Name: Debra Ayers MRN: [id number] DOB:01/18/1971, 52 y.o., female Today's Date: 04/26/2022  PCP: Haydee Salter, MD  REFERRING PROVIDER: Glennon Mac, DO   END OF SESSION:   PT End of Session - 04/26/22 0911     Visit Number 2    Number of Visits 17    Date for PT Re-Evaluation 06/07/22    PT Start Time 0915    PT Stop Time 9798    PT Time Calculation (min) 40 min    Activity Tolerance Patient tolerated treatment well    Behavior During Therapy [facility] for tasks assessed/performed             Past Medical History:  Diagnosis Date   Anemia    Heart murmur    Hypertension    medication   Uterine fibroid    Past Surgical History:  Procedure Laterality Date   AUGMENTATION MAMMAPLASTY Bilateral 07/2020   BACK SURGERY  2004   Benign fatty tumor removed from mid upper back   BREAST ENHANCEMENT SURGERY  2010   BREAST IMPLANT REMOVAL  2013   BREAST SURGERY  2014   Left breast biopsy, benign   LIPOSUCTION  06/2020   TUBAL LIGATION  2001   tummy tuck  92/1194   UMBILICAL HERNIA REPAIR  2018   Patient Active Problem List   Diagnosis Date Noted   Heart murmur 03/28/2022   Second degree hemorrhoids 07/11/2021   Uterine leiomyoma 08/02/2016   Stress incontinence 08/02/2016   Chronic idiopathic constipation 08/12/2014   Family history of diabetes mellitus 06/25/2013   Herpetic whitlow 02/12/2012   Essential hypertension 02/12/2012   Hearing problem 02/12/2012    REFERRING DIAG: M25.571,G89.29,M25.572 (ICD-10-CM) - Chronic pain of both ankles    THERAPY DIAG:  Pain in right ankle and joints of right foot  Muscle weakness (generalized)  Unsteadiness on feet  Rationale for Evaluation and Treatment Rehabilitation  PERTINENT HISTORY: HTN  PRECAUTIONS: None  SUBJECTIVE:                                                                                                                                                                                       SUBJECTIVE STATEMENT:  Patient reports continued lateral ankle pain, R>L and reports HEP compliance, but that she doesn't see any benefit yet.    PAIN:  Are you having pain?  Yes: NPRS scale: 8/10 Worst: 9/10 Pain location: lateral ankles R>L Pain description: sharp Aggravating factors: driving, standing Relieving factors: rest   OBJECTIVE: (objective measures completed at initial evaluation unless otherwise dated)   DIAGNOSTIC FINDINGS:  See imaging   PATIENT SURVEYS:  FOTO: 59% function; 75% predicted    COGNITION: Overall cognitive status: Within functional limits for tasks assessed                         SENSATION: WFL   POSTURE: rounded shoulders and forward head   PALPATION: TTP to lateral R ankle   LOWER EXTREMITY ROM:   Active ROM Right eval Left eval  Hip flexion      Hip extension      Hip abduction      Hip adduction      Hip internal rotation      Hip external rotation      Knee flexion      Knee extension      Ankle dorsiflexion -6 -5  Ankle plantarflexion      Ankle inversion      Ankle eversion       (Blank rows = not tested)   LOWER EXTREMITY MMT:   MMT Right eval Left eval  Hip flexion      Hip extension      Hip abduction      Hip adduction      Hip internal rotation      Hip external rotation      Knee flexion      Knee extension      Ankle dorsiflexion 5 5  Ankle plantarflexion 5 5  Ankle inversion 3+ p! 4  Ankle eversion 4 p! 4   (Blank rows = not tested)   LOWER EXTREMITY SPECIAL TESTS:  DNT   FUNCTIONAL TESTS:  30 Second Sit to Stand: 10 reps SLS: R 9 seconds; L 8 seconds   GAIT: Distance walked: 4f Assistive device utilized: None Level of assistance: Complete Independence Comments: antalgic gait R     TREATMENT: [facility] Adult PT Treatment:                                                DATE: 04/26/2022 Therapeutic Exercise: Nustep level 5 x 5  mins Slant board gastroc stretch x2' Heel raises 3x10 Toe raises back against wall 2x10 Rocker board PF/DF x2' Rocker board inv/ev x2' Ankle inv/ev RTB BIL 2x10 BIL BAPs L2 PF/DF/CW/CCW x20 each Rt  Neuromuscular re-ed: FT stance EC x30" Tandem stance x30" BIL SLS x30" BIL FT stance on airex EC x30" BIL Tandem stance on airex x30" BIL Tandem walking on airex balance beam x4 trips fwd/bwd  [facility]                                                [facility]: 04[facility] Therapeutic Exercise: Calf stretch with towel x 30" each Tennis ball calf mobilization x 30" Ankle inv/ev x 10 RTB Tandem stance x 30" each   PATIENT EDUCATION:  Education details: eval findings, FOTO, HEP, POC Person educated: Patient Education method: Explanation, Demonstration, and Handouts Education comprehension: verbalized understanding and returned demonstration   HOME EXERCISE PROGRAM: Access Code: [email or web address]: [email or web address] Date: 04/12/2022 Prepared by: DOctavio Manns  Exercises - Long Sitting Calf Stretch with Strap  - 1 x daily - 7 x weekly - 2 reps - 30  sec hold - Calf Mobilization with Small Ball  - 1 x daily - 7 x weekly - 2 reps - 1-2 min hold - Seated Ankle Inversion with Resistance and Legs Crossed  - 1 x daily - 7 x weekly - 2 sets - 10 reps - red theraband hold - Long Sitting Ankle Eversion with Resistance  - 1 x daily - 7 x weekly - 2 sets - 10 reps - red theraband hold - Standing Tandem Balance with Counter Support  - 1 x daily - 7 x weekly - 2 sets - 30 sec hold   ASSESSMENT:   CLINICAL IMPRESSION: Patient presents for first follow up PT session reporting continued pain in BIL ankles, R>L and states that she has been compliant with her HEP, but that she doesn't see any improvement yet. Session today focused on ankle ROM and strengthening as well as balance tasks. She had the most difficulty with tandem walking on Airex, needing occasional fingettip support  to maintain balance. Patient was able to tolerate all prescribed exercises with no adverse effects. Patient continues to benefit from skilled PT services and should be progressed as able to improve functional independence.     OBJECTIVE IMPAIRMENTS: Abnormal gait, decreased activity tolerance, decreased balance, decreased ROM, decreased strength, and pain.    ACTIVITY LIMITATIONS: standing and transfers   PARTICIPATION LIMITATIONS: driving and occupation   PERSONAL FACTORS: Fitness and 1-2 comorbidities: HTN   are also affecting patient's functional outcome.    REHAB POTENTIAL: Excellent   CLINICAL DECISION MAKING: Stable/uncomplicated   EVALUATION COMPLEXITY: Low     GOALS: Goals reviewed with patient? No   SHORT TERM GOALS: Target date: 05/03/2022   Pt will be compliant and knowledgeable with initial HEP for improved comfort and carryover Baseline: initial HEP given  Goal status: MET Pt reports adherence 04/26/22   2.  Pt will self report ankle pain no greater than 6/10 for improved comfort and functional ability Baseline: 9/10 at worst Goal status: INITIAL    LONG TERM GOALS: Target date: 06/07/2022   Pt will self report ankle pain no greater than 3/10 for improved comfort and functional ability Baseline: 9/10 at worst Goal status: INITIAL    2.  Pt will improve FOTO function score to no less than 75% as proxy for functional improvement Baseline: 59% function Goal status: INITIAL    3.  Pt will increase 30 Second Sit to Stand rep count to no less than 12 reps for improved balance, strength, and functional mobility Baseline: 10 reps  Goal status: INITIAL    4.  Pt will improve bilateral SLS time to no less than 30 seconds for improved balance and functional mobility Baseline: R 9 seconds; L 8 seconds Goal status: INITIAL   PLAN:   PT FREQUENCY: 1-2x/week   PT DURATION: 8 weeks   PLANNED INTERVENTIONS: Therapeutic exercises, Therapeutic activity, Neuromuscular  re-education, Balance training, Gait training, Patient/Family education, Self Care, Joint mobilization, Dry Needling, Electrical stimulation, Cryotherapy, Moist heat, Vasopneumatic device, Manual therapy, and Re-evaluation   PLAN FOR NEXT SESSION: assess HEP response, ankle strengthening, balance work   Margarette Canada, PTA 04/26/2022, 9:56 AM

## 2022-04-26 ENCOUNTER — Ambulatory Visit: Payer: BC Managed Care – PPO | Attending: Sports Medicine

## 2022-04-26 DIAGNOSIS — R2681 Unsteadiness on feet: Secondary | ICD-10-CM | POA: Insufficient documentation

## 2022-04-26 DIAGNOSIS — M25571 Pain in right ankle and joints of right foot: Secondary | ICD-10-CM | POA: Diagnosis not present

## 2022-04-26 DIAGNOSIS — M6281 Muscle weakness (generalized): Secondary | ICD-10-CM | POA: Diagnosis not present

## 2022-04-26 NOTE — Progress Notes (Unsigned)
    Debra Ayers D.P[facility] [place] Phone: [phone]   Assessment and Plan:     There are no diagnoses linked to this encounter.  ***   Pertinent previous records reviewed include ***   Follow Up: ***     Subjective:   I, Debra Ayers, am serving as a Education administrator for Doctor Glennon Mac   Chief Complaint: bilateral ankle pain    HPI:  04/06/2022 Patient is a 52 year old female complaining of bilateral ankle pain. Patient states 4 month history of intermittent swelling to her ankles. She notes this occurs intermittently. She states the swelling is localized to the lateral aspect of the ankle. She has not seen swelling of the lower leg. She does some associated achiness in the ankle. She has never seen the joint become red. She denies any past significant injury to the ankle. No numbness and tingling, no radiating pain when she flexes she has pain in the ankle no where else, no meds for the pain, standing and driving for a long period of time cause more pain   04/27/2022 Patient states   Relevant Historical Information: Hypertension  Additional pertinent review of systems negative.   Current Outpatient Medications:    amLODipine (NORVASC) 10 MG tablet, TAKE 1 TABLET BY MOUTH DAILY, Disp: 90 tablet, Rfl: 3   fluticasone (FLONASE) 50 MCG/ACT nasal spray, Place 2 sprays into both nostrils daily., Disp: 16 g, Rfl: 3   hydrOXYzine (VISTARIL) 25 MG capsule, Take 1 capsule (25 mg total) by mouth at bedtime as needed., Disp: 30 capsule, Rfl: 0   levonorgestrel (MIRENA) 20 MCG/DAY IUD, 1 each by Intrauterine route once., Disp: , Rfl:    losartan (COZAAR) 100 MG tablet, TAKE 1 TABLET BY MOUTH  DAILY, Disp: 90 tablet, Rfl: 3   meloxicam (MOBIC) 15 MG tablet, Take 1 tablet (15 mg total) by mouth daily., Disp: 30 tablet, Rfl: 0   terbinafine (LAMISIL) 250 MG tablet, Take 1 tablet (250 mg total) by mouth daily for 7 days.  Repeat every 4 weeks for 6 months., Disp: 42 tablet, Rfl: 0   Objective:     There were no vitals filed for this visit.    There is no height or weight on file to calculate BMI.    Physical Exam:    ***   Electronically signed by:  Debra Ayers D.[facility] 12:24 PM 04/26/22

## 2022-04-26 NOTE — Therapy (Incomplete)
OUTPATIENT PHYSICAL THERAPY TREATMENT NOTE   Patient Name: Debra Ayers MRN: [id number] DOB:01/29/1971, 52 y.o., female Today's Date: 04/26/2022  PCP: Haydee Salter, MD  REFERRING PROVIDER: Glennon Mac, DO   END OF SESSION:     Past Medical History:  Diagnosis Date   Anemia    Heart murmur    Hypertension    medication   Uterine fibroid    Past Surgical History:  Procedure Laterality Date   AUGMENTATION MAMMAPLASTY Bilateral 07/2020   BACK SURGERY  2004   Benign fatty tumor removed from mid upper back   BREAST ENHANCEMENT SURGERY  2010   BREAST IMPLANT REMOVAL  2013   BREAST SURGERY  2014   Left breast biopsy, benign   LIPOSUCTION  06/2020   TUBAL LIGATION  2001   tummy tuck  [id number]/8588   UMBILICAL HERNIA REPAIR  2018   Patient Active Problem List   Diagnosis Date Noted   Heart murmur 03/28/2022   Second degree hemorrhoids 07/11/2021   Uterine leiomyoma 08/02/2016   Stress incontinence 08/02/2016   Chronic idiopathic constipation 08/12/2014   Family history of diabetes mellitus 06/25/2013   Herpetic whitlow 02/12/2012   Essential hypertension 02/12/2012   Hearing problem 02/12/2012    REFERRING DIAG: M25.571,G89.29,M25.572 (ICD-10-CM) - Chronic pain of both ankles    THERAPY DIAG:  No diagnosis found.  Rationale for Evaluation and Treatment Rehabilitation  PERTINENT HISTORY: HTN  PRECAUTIONS: None  SUBJECTIVE:                                                                                                                                                                                      SUBJECTIVE STATEMENT:  ***   PAIN:  Are you having pain?  Yes: NPRS scale: 8/10 Worst: 9/10 Pain location: lateral ankles R>L Pain description: sharp Aggravating factors: driving, standing Relieving factors: rest   OBJECTIVE: (objective measures completed at initial evaluation unless otherwise dated)   DIAGNOSTIC FINDINGS:             See  imaging   PATIENT SURVEYS:  FOTO: 59% function; 75% predicted    COGNITION: Overall cognitive status: Within functional limits for tasks assessed                         SENSATION: WFL   POSTURE: rounded shoulders and forward head   PALPATION: TTP to lateral R ankle   LOWER EXTREMITY ROM:   Active ROM Right eval Left eval  Hip flexion      Hip extension      Hip abduction      Hip adduction  Hip internal rotation      Hip external rotation      Knee flexion      Knee extension      Ankle dorsiflexion -6 -5  Ankle plantarflexion      Ankle inversion      Ankle eversion       (Blank rows = not tested)   LOWER EXTREMITY MMT:   MMT Right eval Left eval  Hip flexion      Hip extension      Hip abduction      Hip adduction      Hip internal rotation      Hip external rotation      Knee flexion      Knee extension      Ankle dorsiflexion 5 5  Ankle plantarflexion 5 5  Ankle inversion 3+ p! 4  Ankle eversion 4 p! 4   (Blank rows = not tested)   LOWER EXTREMITY SPECIAL TESTS:  DNT   FUNCTIONAL TESTS:  30 Second Sit to Stand: 10 reps SLS: R 9 seconds; L 8 seconds   GAIT: Distance walked: 6f Assistive device utilized: None Level of assistance: Complete Independence Comments: antalgic gait R     TREATMENT: [facility] Adult PT Treatment:                                                DATE: 04/27/2022 Therapeutic Exercise: Nustep level 5 x 5 mins Slant board gastroc stretch x2' Heel raises 3x10 Toe raises back against wall 2x10 Rocker board PF/DF x2' Rocker board inv/ev x2' Ankle inv/ev RTB BIL 2x10 BIL BAPs L2 PF/DF/CW/CCW x20 each Rt  Neuromuscular re-ed: FT stance EC x30" Tandem stance x30" BIL SLS x30" BIL FT stance on airex EC x30" BIL Tandem stance on airex x30" BIL Tandem walking on airex balance beam x4 trips fwd/bwd  [facility]                                                [facility]: 04[facility]26/20[facility] Therapeutic Exercise: Nustep  level 5 x 5 mins Slant board gastroc stretch x2' Heel raises 3x10 Toe raises back against wall 2x10 Rocker board PF/DF x2' Rocker board inv/ev x2' Ankle inv/ev RTB BIL 2x10 BIL BAPs L2 PF/DF/CW/CCW x20 each Rt  Neuromuscular re-ed: FT stance EC x30" Tandem stance x30" BIL SLS x30" BIL FT stance on airex EC x30" BIL Tandem stance on airex x30" BIL Tandem walking on airex balance beam x4 trips fwd/bwd  [facility]                                                DATE: 04/12/2022 Therapeutic Exercise: Calf stretch with towel x 30" each Tennis ball calf mobilization x 30" Ankle inv/ev x 10 RTB Tandem stance x 30" each   PATIENT EDUCATION:  Education details: eval findings, FOTO, HEP, POC Person educated: Patient Education method: Explanation, Demonstration, and Handouts Education comprehension: verbalized understanding and returned demonstration   HOME EXERCISE PROGRAM: Access Code: [email or web address]: [email or web address] Date: 04/12/2022 Prepared by: DOctavio Manns  Exercises - Long Sitting Calf Stretch with Strap  - 1 x daily - 7 x weekly - 2 reps - 30 sec hold - Calf Mobilization with Small Ball  - 1 x daily - 7 x weekly - 2 reps - 1-2 min hold - Seated Ankle Inversion with Resistance and Legs Crossed  - 1 x daily - 7 x weekly - 2 sets - 10 reps - red theraband hold - Long Sitting Ankle Eversion with Resistance  - 1 x daily - 7 x weekly - 2 sets - 10 reps - red theraband hold - Standing Tandem Balance with Counter Support  - 1 x daily - 7 x weekly - 2 sets - 30 sec hold   ASSESSMENT:   CLINICAL IMPRESSION: ***   OBJECTIVE IMPAIRMENTS: Abnormal gait, decreased activity tolerance, decreased balance, decreased ROM, decreased strength, and pain.    ACTIVITY LIMITATIONS: standing and transfers   PARTICIPATION LIMITATIONS: driving and occupation   PERSONAL FACTORS: Fitness and 1-2 comorbidities: HTN   are also affecting patient's functional outcome.       GOALS: Goals reviewed with patient? No   SHORT TERM GOALS: Target date: 05/03/2022   Pt will be compliant and knowledgeable with initial HEP for improved comfort and carryover Baseline: initial HEP given  Goal status: MET Pt reports adherence 04/26/22   2.  Pt will self report ankle pain no greater than 6/10 for improved comfort and functional ability Baseline: 9/10 at worst Goal status: INITIAL    LONG TERM GOALS: Target date: 06/07/2022   Pt will self report ankle pain no greater than 3/10 for improved comfort and functional ability Baseline: 9/10 at worst Goal status: INITIAL    2.  Pt will improve FOTO function score to no less than 75% as proxy for functional improvement Baseline: 59% function Goal status: INITIAL    3.  Pt will increase 30 Second Sit to Stand rep count to no less than 12 reps for improved balance, strength, and functional mobility Baseline: 10 reps  Goal status: INITIAL    4.  Pt will improve bilateral SLS time to no less than 30 seconds for improved balance and functional mobility Baseline: R 9 seconds; L 8 seconds Goal status: INITIAL   PLAN:   PT FREQUENCY: 1-2x/week   PT DURATION: 8 weeks   PLANNED INTERVENTIONS: Therapeutic exercises, Therapeutic activity, Neuromuscular re-education, Balance training, Gait training, Patient/Family education, Self Care, Joint mobilization, Dry Needling, Electrical stimulation, Cryotherapy, Moist heat, Vasopneumatic device, Manual therapy, and Re-evaluation   PLAN FOR NEXT SESSION: assess HEP response, ankle strengthening, balance work   Ward Chatters, PT 04/26/2022, 3:39 PM

## 2022-04-27 ENCOUNTER — Ambulatory Visit: Payer: BC Managed Care – PPO

## 2022-04-27 ENCOUNTER — Ambulatory Visit (INDEPENDENT_AMBULATORY_CARE_PROVIDER_SITE_OTHER): Payer: BC Managed Care – PPO | Admitting: Sports Medicine

## 2022-04-27 VITALS — BP 130/78 | HR 74 | Ht 64.0 in | Wt 172.0 lb

## 2022-04-27 DIAGNOSIS — Q6672 Congenital pes cavus, left foot: Secondary | ICD-10-CM | POA: Diagnosis not present

## 2022-04-27 DIAGNOSIS — R2681 Unsteadiness on feet: Secondary | ICD-10-CM

## 2022-04-27 DIAGNOSIS — M6281 Muscle weakness (generalized): Secondary | ICD-10-CM

## 2022-04-27 DIAGNOSIS — Q6671 Congenital pes cavus, right foot: Secondary | ICD-10-CM

## 2022-04-27 DIAGNOSIS — M25571 Pain in right ankle and joints of right foot: Secondary | ICD-10-CM | POA: Diagnosis not present

## 2022-04-27 DIAGNOSIS — M25572 Pain in left ankle and joints of left foot: Secondary | ICD-10-CM

## 2022-04-27 DIAGNOSIS — G8929 Other chronic pain: Secondary | ICD-10-CM

## 2022-04-27 NOTE — Therapy (Signed)
OUTPATIENT PHYSICAL THERAPY TREATMENT NOTE   Patient Name: Debra Ayers MRN: [id number] DOB:12-15-1970, 52 y.o., female Today's Date: 04/27/2022  PCP: Haydee Salter, MD  REFERRING PROVIDER: Glennon Mac, DO   END OF SESSION:   PT End of Session - 04/27/22 0905     Visit Number 3    Number of Visits 17    Date for PT Re-Evaluation 06/07/22    PT Start Time 0915    PT Stop Time 1000    PT Time Calculation (min) 45 min    Activity Tolerance Patient tolerated treatment well              Past Medical History:  Diagnosis Date   Anemia    Heart murmur    Hypertension    medication   Uterine fibroid    Past Surgical History:  Procedure Laterality Date   AUGMENTATION MAMMAPLASTY Bilateral 07/2020   BACK SURGERY  2004   Benign fatty tumor removed from mid upper back   BREAST ENHANCEMENT SURGERY  2010   BREAST IMPLANT REMOVAL  2013   BREAST SURGERY  2014   Left breast biopsy, benign   LIPOSUCTION  06/2020   TUBAL LIGATION  2001   tummy tuck  66/4403   UMBILICAL HERNIA REPAIR  2018   Patient Active Problem List   Diagnosis Date Noted   Heart murmur 03/28/2022   Second degree hemorrhoids 07/11/2021   Uterine leiomyoma 08/02/2016   Stress incontinence 08/02/2016   Chronic idiopathic constipation 08/12/2014   Family history of diabetes mellitus 06/25/2013   Herpetic whitlow 02/12/2012   Essential hypertension 02/12/2012   Hearing problem 02/12/2012    REFERRING DIAG: M25.571,G89.29,M25.572 (ICD-10-CM) - Chronic pain of both ankles    THERAPY DIAG:  Pain in right ankle and joints of right foot  Muscle weakness (generalized)  Unsteadiness on feet  Rationale for Evaluation and Treatment Rehabilitation  PERTINENT HISTORY: HTN  PRECAUTIONS: None  SUBJECTIVE:                                                                                                                                                                                      SUBJECTIVE  STATEMENT:  Patient reports continued lateral ankle pain, R>L and that she has some soreness from yesterdays session.   PAIN:  Are you having pain?  Yes: NPRS scale: 8/10 Worst: 9/10 Pain location: lateral ankles R>L Pain description: sharp Aggravating factors: driving, standing Relieving factors: rest   OBJECTIVE: (objective measures completed at initial evaluation unless otherwise dated)   DIAGNOSTIC FINDINGS:             See imaging   PATIENT SURVEYS:  FOTO: 59% function; 75% predicted    COGNITION: Overall cognitive status: Within functional limits for tasks assessed                         SENSATION: WFL   POSTURE: rounded shoulders and forward head   PALPATION: TTP to lateral R ankle   LOWER EXTREMITY ROM:   Active ROM Right eval Left eval  Hip flexion      Hip extension      Hip abduction      Hip adduction      Hip internal rotation      Hip external rotation      Knee flexion      Knee extension      Ankle dorsiflexion -6 -5  Ankle plantarflexion      Ankle inversion      Ankle eversion       (Blank rows = not tested)   LOWER EXTREMITY MMT:   MMT Right eval Left eval  Hip flexion      Hip extension      Hip abduction      Hip adduction      Hip internal rotation      Hip external rotation      Knee flexion      Knee extension      Ankle dorsiflexion 5 5  Ankle plantarflexion 5 5  Ankle inversion 3+ p! 4  Ankle eversion 4 p! 4   (Blank rows = not tested)   LOWER EXTREMITY SPECIAL TESTS:  DNT   FUNCTIONAL TESTS:  30 Second Sit to Stand: 10 reps SLS: R 9 seconds; L 8 seconds   GAIT: Distance walked: 71f Assistive device utilized: None Level of assistance: Complete Independence Comments: antalgic gait R     TREATMENT: [facility] Adult PT Treatment:                                                DATE: 04/27/2022 Therapeutic Exercise: Bike level 3 x 5 mins Slant board gastroc stretch x2' Slant board soleus stretch x1' Heel  raises with tennis ball between heel  2x10 Alternating toe raises back against wall 2x10 BIL Rocker board PF/DF x2' Rocker board inv/ev x2' Ankle inv/ev RTB BIL 2x10 BIL BAPs L2 PF/DF/CW/CCW x20 each BIL Neuromuscular re-ed: FT stance EC x30" Tandem stance x30" BIL SLS x30" BIL FT stance on airex EC x30" BIL Tandem stance on airex x30" BIL Tandem walking on airex balance beam x5 trips fwd/bwd [place] walking on airex balance beam x3 trips SLS on Airex x30" BIL  [facility] Adult PT Treatment:                                                DATE: 04/26/2022 Therapeutic Exercise: Nustep level 5 x 5 mins Slant board gastroc stretch x2' Heel raises 3x10 Toe raises back against wall 2x10 Rocker board PF/DF x2' Rocker board inv/ev x2' Ankle inv/ev RTB BIL 2x10 BIL BAPs L2 PF/DF/CW/CCW x20 each Rt  Neuromuscular re-ed: FT stance EC x30" Tandem stance x30" BIL SLS x30" BIL FT stance on airex EC x30" BIL Tandem stance on airex x30" BIL Tandem walking on airex balance beam  x4 trips fwd/bwd  [facility] Adult PT Treatment:                                                DATE: 04/12/2022 Therapeutic Exercise: Calf stretch with towel x 30" each Tennis ball calf mobilization x 30" Ankle inv/ev x 10 RTB Tandem stance x 30" each   PATIENT EDUCATION:  Education details: eval findings, FOTO, HEP, POC Person educated: Patient Education method: Explanation, Demonstration, and Handouts Education comprehension: verbalized understanding and returned demonstration   HOME EXERCISE PROGRAM: Access Code: [email or web address] URL: [email or web address] Date: 04/12/2022 Prepared by: Octavio Manns   Exercises - Long Sitting Calf Stretch with Strap  - 1 x daily - 7 x weekly - 2 reps - 30 sec hold - Calf Mobilization with Small Ball  - 1 x daily - 7 x weekly - 2 reps - 1-2 min hold - Seated Ankle Inversion with Resistance and Legs Crossed  - 1 x daily - 7 x weekly - 2 sets - 10 reps - red theraband hold -  Long Sitting Ankle Eversion with Resistance  - 1 x daily - 7 x weekly - 2 sets - 10 reps - red theraband hold - Standing Tandem Balance with Counter Support  - 1 x daily - 7 x weekly - 2 sets - 30 sec hold   ASSESSMENT:   CLINICAL IMPRESSION: Patient presents to PT with some soreness from yesterdays session and reports continued lateral ankle pain R>L. Session today continued to focus on distal LE and ankle strengthening as well as balance tasks. She has the most pain during session with resisted inversion on the Rt. Patient was able to tolerate all prescribed exercises with no adverse effects. Patient continues to benefit from skilled PT services and should be progressed as able to improve functional independence.     OBJECTIVE IMPAIRMENTS: Abnormal gait, decreased activity tolerance, decreased balance, decreased ROM, decreased strength, and pain.    ACTIVITY LIMITATIONS: standing and transfers   PARTICIPATION LIMITATIONS: driving and occupation   PERSONAL FACTORS: Fitness and 1-2 comorbidities: HTN   are also affecting patient's functional outcome.    REHAB POTENTIAL: Excellent   CLINICAL DECISION MAKING: Stable/uncomplicated   EVALUATION COMPLEXITY: Low     GOALS: Goals reviewed with patient? No   SHORT TERM GOALS: Target date: 05/03/2022   Pt will be compliant and knowledgeable with initial HEP for improved comfort and carryover Baseline: initial HEP given  Goal status: MET Pt reports adherence 04/26/22   2.  Pt will self report ankle pain no greater than 6/10 for improved comfort and functional ability Baseline: 9/10 at worst Goal status: INITIAL    LONG TERM GOALS: Target date: 06/07/2022   Pt will self report ankle pain no greater than 3/10 for improved comfort and functional ability Baseline: 9/10 at worst Goal status: INITIAL    2.  Pt will improve FOTO function score to no less than 75% as proxy for functional improvement Baseline: 59% function Goal status: INITIAL     3.  Pt will increase 30 Second Sit to Stand rep count to no less than 12 reps for improved balance, strength, and functional mobility Baseline: 10 reps  Goal status: INITIAL    4.  Pt will improve bilateral SLS time to no less than 30 seconds for improved balance and functional mobility Baseline: R 9 seconds;  L 8 seconds Goal status: INITIAL   PLAN:   PT FREQUENCY: 1-2x/week   PT DURATION: 8 weeks   PLANNED INTERVENTIONS: Therapeutic exercises, Therapeutic activity, Neuromuscular re-education, Balance training, Gait training, Patient/Family education, Self Care, Joint mobilization, Dry Needling, Electrical stimulation, Cryotherapy, Moist heat, Vasopneumatic device, Manual therapy, and Re-evaluation   PLAN FOR NEXT SESSION: assess HEP response, ankle strengthening, balance work   [employer], PTA 04/27/2022, 9:06 AM

## 2022-04-27 NOTE — Patient Instructions (Signed)
Discontinue meloxicam and use remainder as needed Use tylenol for day to day pain relief  Continue PT  Recommend getting flat feet inserts [employer] or similar store Recommend getting a lace up ankle brace to use on busy days  3-4 week follow up

## 2022-05-02 ENCOUNTER — Ambulatory Visit: Payer: BC Managed Care – PPO

## 2022-05-02 NOTE — Therapy (Incomplete)
OUTPATIENT PHYSICAL THERAPY TREATMENT NOTE   Patient Name: Debra Ayers MRN: [id number] DOB:[birth year], 52 y.o., female Today's Date: 05/02/2022  PCP: Haydee Salter, MD  REFERRING PROVIDER: Glennon Mac, DO   END OF SESSION:      Past Medical History:  Diagnosis Date   Anemia    Heart murmur    Hypertension    medication   Uterine fibroid    Past Surgical History:  Procedure Laterality Date   AUGMENTATION MAMMAPLASTY Bilateral 07/2020   BACK SURGERY  2004   Benign fatty tumor removed from mid upper back   BREAST ENHANCEMENT SURGERY  2010   BREAST IMPLANT REMOVAL  2013   BREAST SURGERY  2014   Left breast biopsy, benign   LIPOSUCTION  06/2020   TUBAL LIGATION  2001   tummy tuck  63/0160   UMBILICAL HERNIA REPAIR  2018   Patient Active Problem List   Diagnosis Date Noted   Heart murmur 03/28/2022   Second degree hemorrhoids 07/11/2021   Uterine leiomyoma 08/02/2016   Stress incontinence 08/02/2016   Chronic idiopathic constipation 08/12/2014   Family history of diabetes mellitus 06/25/2013   Herpetic whitlow 02/12/2012   Essential hypertension 02/12/2012   Hearing problem 02/12/2012    REFERRING DIAG: M25.571,G89.29,M25.572 (ICD-10-CM) - Chronic pain of both ankles    THERAPY DIAG:  No diagnosis found.  Rationale for Evaluation and Treatment Rehabilitation  PERTINENT HISTORY: HTN  PRECAUTIONS: None  SUBJECTIVE:                                                                                                                                                                                      SUBJECTIVE STATEMENT:  ***   PAIN:  Are you having pain?  Yes: NPRS scale: 8/10 Worst: 9/10 Pain location: lateral ankles R>L Pain description: sharp Aggravating factors: driving, standing Relieving factors: rest   OBJECTIVE: (objective measures completed at initial evaluation unless otherwise dated)   DIAGNOSTIC FINDINGS:             See  imaging   PATIENT SURVEYS:  FOTO: 59% function; 75% predicted    COGNITION: Overall cognitive status: Within functional limits for tasks assessed                         SENSATION: WFL   POSTURE: rounded shoulders and forward head   PALPATION: TTP to lateral R ankle   LOWER EXTREMITY ROM:   Active ROM Right eval Left eval  Hip flexion      Hip extension      Hip abduction      Hip adduction  Hip internal rotation      Hip external rotation      Knee flexion      Knee extension      Ankle dorsiflexion -6 -5  Ankle plantarflexion      Ankle inversion      Ankle eversion       (Blank rows = not tested)   LOWER EXTREMITY MMT:   MMT Right eval Left eval  Hip flexion      Hip extension      Hip abduction      Hip adduction      Hip internal rotation      Hip external rotation      Knee flexion      Knee extension      Ankle dorsiflexion 5 5  Ankle plantarflexion 5 5  Ankle inversion 3+ p! 4  Ankle eversion 4 p! 4   (Blank rows = not tested)   LOWER EXTREMITY SPECIAL TESTS:  DNT   FUNCTIONAL TESTS:  30 Second Sit to Stand: 10 reps SLS: R 9 seconds; L 8 seconds   GAIT: Distance walked: 48f Assistive device utilized: None Level of assistance: Complete Independence Comments: antalgic gait R     TREATMENT: [facility] Adult PT Treatment:                                                DATE: 05/02/2022 Therapeutic Exercise: Bike level 3 x 5 mins Slant board gastroc stretch x2' Slant board soleus stretch x1' Heel raises with tennis ball between heel  2x10 Alternating toe raises back against wall 2x10 BIL Rocker board PF/DF x2' Rocker board inv/ev x2' Ankle inv/ev RTB BIL 2x10 BIL BAPs L2 PF/DF/CW/CCW x20 each BIL Neuromuscular re-ed: FT stance EC x30" Tandem stance x30" BIL SLS x30" BIL FT stance on airex EC x30" BIL Tandem stance on airex x30" BIL Tandem walking on airex balance beam x5 trips fwd/bwd [place] walking on airex balance beam x3  trips SLS on Airex x30" BIL  [facility] Adult PT Treatment:                                                DATE: 04/27/2022 Therapeutic Exercise: Bike level 3 x 5 mins Slant board gastroc stretch x2' Slant board soleus stretch x1' Heel raises with tennis ball between heel  2x10 Alternating toe raises back against wall 2x10 BIL Rocker board PF/DF x2' Rocker board inv/ev x2' Ankle inv/ev RTB BIL 2x10 BIL BAPs L2 PF/DF/CW/CCW x20 each BIL Neuromuscular re-ed: FT stance EC x30" Tandem stance x30" BIL SLS x30" BIL FT stance on airex EC x30" BIL Tandem stance on airex x30" BIL Tandem walking on airex balance beam x5 trips fwd/bwd [place] walking on airex balance beam x3 trips SLS on Airex x30" BIL  [facility] Adult PT Treatment:                                                DATE: 04/26/2022 Therapeutic Exercise: Nustep level 5 x 5 mins Slant board gastroc stretch x2' Heel raises 3x10 Toe raises back against  wall 2x10 Rocker board PF/DF x2' Rocker board inv/ev x2' Ankle inv/ev RTB BIL 2x10 BIL BAPs L2 PF/DF/CW/CCW x20 each Rt  Neuromuscular re-ed: FT stance EC x30" Tandem stance x30" BIL SLS x30" BIL FT stance on airex EC x30" BIL Tandem stance on airex x30" BIL Tandem walking on airex balance beam x4 trips fwd/bwd  [facility] Adult PT Treatment:                                                DATE: 04/12/2022 Therapeutic Exercise: Calf stretch with towel x 30" each Tennis ball calf mobilization x 30" Ankle inv/ev x 10 RTB Tandem stance x 30" each   PATIENT EDUCATION:  Education details: eval findings, FOTO, HEP, POC Person educated: Patient Education method: Explanation, Demonstration, and Handouts Education comprehension: verbalized understanding and returned demonstration   HOME EXERCISE PROGRAM: Access Code: [email or web address] URL: [email or web address] Date: 04/12/2022 Prepared by: Octavio Manns   Exercises - Long Sitting Calf Stretch with Strap  - 1 x daily - 7 x weekly - 2  reps - 30 sec hold - Calf Mobilization with Small Ball  - 1 x daily - 7 x weekly - 2 reps - 1-2 min hold - Seated Ankle Inversion with Resistance and Legs Crossed  - 1 x daily - 7 x weekly - 2 sets - 10 reps - red theraband hold - Long Sitting Ankle Eversion with Resistance  - 1 x daily - 7 x weekly - 2 sets - 10 reps - red theraband hold - Standing Tandem Balance with Counter Support  - 1 x daily - 7 x weekly - 2 sets - 30 sec hold   ASSESSMENT:   CLINICAL IMPRESSION: ***    OBJECTIVE IMPAIRMENTS: Abnormal gait, decreased activity tolerance, decreased balance, decreased ROM, decreased strength, and pain.    ACTIVITY LIMITATIONS: standing and transfers   PARTICIPATION LIMITATIONS: driving and occupation   PERSONAL FACTORS: Fitness and 1-2 comorbidities: HTN   are also affecting patient's functional outcome.      GOALS: Goals reviewed with patient? No   SHORT TERM GOALS: Target date: 05/03/2022   Pt will be compliant and knowledgeable with initial HEP for improved comfort and carryover Baseline: initial HEP given  Goal status: MET Pt reports adherence 04/26/22   2.  Pt will self report ankle pain no greater than 6/10 for improved comfort and functional ability Baseline: 9/10 at worst Goal status: INITIAL    LONG TERM GOALS: Target date: 06/07/2022   Pt will self report ankle pain no greater than 3/10 for improved comfort and functional ability Baseline: 9/10 at worst Goal status: INITIAL    2.  Pt will improve FOTO function score to no less than 75% as proxy for functional improvement Baseline: 59% function Goal status: INITIAL    3.  Pt will increase 30 Second Sit to Stand rep count to no less than 12 reps for improved balance, strength, and functional mobility Baseline: 10 reps  Goal status: INITIAL    4.  Pt will improve bilateral SLS time to no less than 30 seconds for improved balance and functional mobility Baseline: R 9 seconds; L 8 seconds Goal status: INITIAL    PLAN:   PT FREQUENCY: 1-2x/week   PT DURATION: 8 weeks   PLANNED INTERVENTIONS: Therapeutic exercises, Therapeutic activity, Neuromuscular re-education, Balance training, Gait training, Patient/Family education, Self  Care, Joint mobilization, Dry Needling, Electrical stimulation, Cryotherapy, Moist heat, Vasopneumatic device, Manual therapy, and Re-evaluation   PLAN FOR NEXT SESSION: assess HEP response, ankle strengthening, balance work   Ward Chatters, PT 05/02/2022, 7:44 AM

## 2022-05-03 ENCOUNTER — Other Ambulatory Visit: Payer: Self-pay | Admitting: Sports Medicine

## 2022-05-03 NOTE — Therapy (Incomplete)
OUTPATIENT PHYSICAL THERAPY TREATMENT NOTE   Patient Name: Debra Ayers MRN: [id number] DOB:1971/01/03, 52 y.o., female Today's Date: 05/03/2022  PCP: Haydee Salter, MD  REFERRING PROVIDER: Glennon Mac, DO   END OF SESSION:      Past Medical History:  Diagnosis Date   Anemia    Heart murmur    Hypertension    medication   Uterine fibroid    Past Surgical History:  Procedure Laterality Date   AUGMENTATION MAMMAPLASTY Bilateral 07/2020   BACK SURGERY  2004   Benign fatty tumor removed from mid upper back   BREAST ENHANCEMENT SURGERY  2010   BREAST IMPLANT REMOVAL  2013   BREAST SURGERY  2014   Left breast biopsy, benign   LIPOSUCTION  06/2020   TUBAL LIGATION  2001   tummy tuck  40/9735   UMBILICAL HERNIA REPAIR  2018   Patient Active Problem List   Diagnosis Date Noted   Heart murmur 03/28/2022   Second degree hemorrhoids 07/11/2021   Uterine leiomyoma 08/02/2016   Stress incontinence 08/02/2016   Chronic idiopathic constipation 08/12/2014   Family history of diabetes mellitus 06/25/2013   Herpetic whitlow 02/12/2012   Essential hypertension 02/12/2012   Hearing problem 02/12/2012    REFERRING DIAG: M25.571,G89.29,M25.572 (ICD-10-CM) - Chronic pain of both ankles    THERAPY DIAG:  No diagnosis found.  Rationale for Evaluation and Treatment Rehabilitation  PERTINENT HISTORY: HTN  PRECAUTIONS: None  SUBJECTIVE:                                                                                                                                                                                      SUBJECTIVE STATEMENT:  ***   PAIN:  Are you having pain?  Yes: NPRS scale: 8/10 Worst: 9/10 Pain location: lateral ankles R>L Pain description: sharp Aggravating factors: driving, standing Relieving factors: rest   OBJECTIVE: (objective measures completed at initial evaluation unless otherwise dated)   DIAGNOSTIC FINDINGS:             See  imaging   PATIENT SURVEYS:  FOTO: 59% function; 75% predicted    COGNITION: Overall cognitive status: Within functional limits for tasks assessed                         SENSATION: WFL   POSTURE: rounded shoulders and forward head   PALPATION: TTP to lateral R ankle   LOWER EXTREMITY ROM:   Active ROM Right eval Left eval  Hip flexion      Hip extension      Hip abduction      Hip adduction  Hip internal rotation      Hip external rotation      Knee flexion      Knee extension      Ankle dorsiflexion -6 -5  Ankle plantarflexion      Ankle inversion      Ankle eversion       (Blank rows = not tested)   LOWER EXTREMITY MMT:   MMT Right eval Left eval  Hip flexion      Hip extension      Hip abduction      Hip adduction      Hip internal rotation      Hip external rotation      Knee flexion      Knee extension      Ankle dorsiflexion 5 5  Ankle plantarflexion 5 5  Ankle inversion 3+ p! 4  Ankle eversion 4 p! 4   (Blank rows = not tested)   LOWER EXTREMITY SPECIAL TESTS:  DNT   FUNCTIONAL TESTS:  30 Second Sit to Stand: 10 reps SLS: R 9 seconds; L 8 seconds   GAIT: Distance walked: 2f Assistive device utilized: None Level of assistance: Complete Independence Comments: antalgic gait R     TREATMENT: [facility] Adult PT Treatment:                                                DATE: 05/04/2022 Therapeutic Exercise: Bike level 3 x 5 mins Slant board gastroc stretch x2' Slant board soleus stretch x1' Heel raises with tennis ball between heel  2x10 Alternating toe raises back against wall 2x10 BIL Rocker board PF/DF x2' Rocker board inv/ev x2' Ankle inv/ev RTB BIL 2x10 BIL BAPs L2 PF/DF/CW/CCW x20 each BIL Neuromuscular re-ed: FT stance EC x30" Tandem stance x30" BIL SLS x30" BIL FT stance on airex EC x30" BIL Tandem stance on airex x30" BIL Tandem walking on airex balance beam x5 trips fwd/bwd [place] walking on airex balance beam x3  trips SLS on Airex x30" BIL  [facility] Adult PT Treatment:                                                DATE: 04/27/2022 Therapeutic Exercise: Bike level 3 x 5 mins Slant board gastroc stretch x2' Slant board soleus stretch x1' Heel raises with tennis ball between heel  2x10 Alternating toe raises back against wall 2x10 BIL Rocker board PF/DF x2' Rocker board inv/ev x2' Ankle inv/ev RTB BIL 2x10 BIL BAPs L2 PF/DF/CW/CCW x20 each BIL Neuromuscular re-ed: FT stance EC x30" Tandem stance x30" BIL SLS x30" BIL FT stance on airex EC x30" BIL Tandem stance on airex x30" BIL Tandem walking on airex balance beam x5 trips fwd/bwd [place] walking on airex balance beam x3 trips SLS on Airex x30" BIL  [facility] Adult PT Treatment:                                                DATE: 04/26/2022 Therapeutic Exercise: Nustep level 5 x 5 mins Slant board gastroc stretch x2' Heel raises 3x10 Toe raises back against  wall 2x10 Rocker board PF/DF x2' Rocker board inv/ev x2' Ankle inv/ev RTB BIL 2x10 BIL BAPs L2 PF/DF/CW/CCW x20 each Rt  Neuromuscular re-ed: FT stance EC x30" Tandem stance x30" BIL SLS x30" BIL FT stance on airex EC x30" BIL Tandem stance on airex x30" BIL Tandem walking on airex balance beam x4 trips fwd/bwd  [facility] Adult PT Treatment:                                                DATE: 04/12/2022 Therapeutic Exercise: Calf stretch with towel x 30" each Tennis ball calf mobilization x 30" Ankle inv/ev x 10 RTB Tandem stance x 30" each   PATIENT EDUCATION:  Education details: eval findings, FOTO, HEP, POC Person educated: Patient Education method: Explanation, Demonstration, and Handouts Education comprehension: verbalized understanding and returned demonstration   HOME EXERCISE PROGRAM: Access Code: [email or web address] URL: [email or web address] Date: 04/12/2022 Prepared by: Octavio Manns   Exercises - Long Sitting Calf Stretch with Strap  - 1 x daily - 7 x weekly - 2  reps - 30 sec hold - Calf Mobilization with Small Ball  - 1 x daily - 7 x weekly - 2 reps - 1-2 min hold - Seated Ankle Inversion with Resistance and Legs Crossed  - 1 x daily - 7 x weekly - 2 sets - 10 reps - red theraband hold - Long Sitting Ankle Eversion with Resistance  - 1 x daily - 7 x weekly - 2 sets - 10 reps - red theraband hold - Standing Tandem Balance with Counter Support  - 1 x daily - 7 x weekly - 2 sets - 30 sec hold   ASSESSMENT:   CLINICAL IMPRESSION: ***    OBJECTIVE IMPAIRMENTS: Abnormal gait, decreased activity tolerance, decreased balance, decreased ROM, decreased strength, and pain.    ACTIVITY LIMITATIONS: standing and transfers   PARTICIPATION LIMITATIONS: driving and occupation   PERSONAL FACTORS: Fitness and 1-2 comorbidities: HTN   are also affecting patient's functional outcome.      GOALS: Goals reviewed with patient? No   SHORT TERM GOALS: Target date: 05/03/2022   Pt will be compliant and knowledgeable with initial HEP for improved comfort and carryover Baseline: initial HEP given  Goal status: MET Pt reports adherence 04/26/22   2.  Pt will self report ankle pain no greater than 6/10 for improved comfort and functional ability Baseline: 9/10 at worst Goal status: INITIAL    LONG TERM GOALS: Target date: 06/07/2022   Pt will self report ankle pain no greater than 3/10 for improved comfort and functional ability Baseline: 9/10 at worst Goal status: INITIAL    2.  Pt will improve FOTO function score to no less than 75% as proxy for functional improvement Baseline: 59% function Goal status: INITIAL    3.  Pt will increase 30 Second Sit to Stand rep count to no less than 12 reps for improved balance, strength, and functional mobility Baseline: 10 reps  Goal status: INITIAL    4.  Pt will improve bilateral SLS time to no less than 30 seconds for improved balance and functional mobility Baseline: R 9 seconds; L 8 seconds Goal status: INITIAL    PLAN:   PT FREQUENCY: 1-2x/week   PT DURATION: 8 weeks   PLANNED INTERVENTIONS: Therapeutic exercises, Therapeutic activity, Neuromuscular re-education, Balance training, Gait training, Patient/Family education, Self  Care, Joint mobilization, Dry Needling, Electrical stimulation, Cryotherapy, Moist heat, Vasopneumatic device, Manual therapy, and Re-evaluation   PLAN FOR NEXT SESSION: assess HEP response, ankle strengthening, balance work   Ward Chatters, PT 05/03/2022, 9:38 AM

## 2022-05-04 ENCOUNTER — Ambulatory Visit: Payer: BC Managed Care – PPO

## 2022-05-09 ENCOUNTER — Ambulatory Visit: Payer: BC Managed Care – PPO

## 2022-05-09 DIAGNOSIS — M25571 Pain in right ankle and joints of right foot: Secondary | ICD-10-CM | POA: Diagnosis not present

## 2022-05-09 DIAGNOSIS — R2681 Unsteadiness on feet: Secondary | ICD-10-CM

## 2022-05-09 DIAGNOSIS — M6281 Muscle weakness (generalized): Secondary | ICD-10-CM | POA: Diagnosis not present

## 2022-05-09 NOTE — Therapy (Addendum)
OUTPATIENT PHYSICAL THERAPY TREATMENT NOTE   Patient Name: Debra Ayers MRN: [id number] DOB:1970-06-17, 52 y.o., female Today's Date: 05/10/2022  PCP: Haydee Salter, MD  REFERRING PROVIDER: Glennon Mac, DO   END OF SESSION:   PT End of Session - 05/09/22 1702     Visit Number 4    Number of Visits 17    Date for PT Re-Evaluation 06/07/22    PT Start Time 1701    PT Stop Time 2774    PT Time Calculation (min) 39 min    Activity Tolerance Patient tolerated treatment well    Behavior During Therapy [facility] for tasks assessed/performed               Past Medical History:  Diagnosis Date   Anemia    Heart murmur    Hypertension    medication   Uterine fibroid    Past Surgical History:  Procedure Laterality Date   AUGMENTATION MAMMAPLASTY Bilateral 07/2020   BACK SURGERY  2004   Benign fatty tumor removed from mid upper back   BREAST ENHANCEMENT SURGERY  2010   BREAST IMPLANT REMOVAL  2013   BREAST SURGERY  2014   Left breast biopsy, benign   LIPOSUCTION  06/2020   TUBAL LIGATION  2001   tummy tuck  03/8785   UMBILICAL HERNIA REPAIR  2018   Patient Active Problem List   Diagnosis Date Noted   Heart murmur 03/28/2022   Second degree hemorrhoids 07/11/2021   Uterine leiomyoma 08/02/2016   Stress incontinence 08/02/2016   Chronic idiopathic constipation 08/12/2014   Family history of diabetes mellitus 06/25/2013   Herpetic whitlow 02/12/2012   Essential hypertension 02/12/2012   Hearing problem 02/12/2012    REFERRING DIAG: M25.571,G89.29,M25.572 (ICD-10-CM) - Chronic pain of both ankles    THERAPY DIAG:  Pain in right ankle and joints of right foot  Muscle weakness (generalized)  Unsteadiness on feet  Rationale for Evaluation and Treatment Rehabilitation  PERTINENT HISTORY: HTN  PRECAUTIONS: None  SUBJECTIVE:                                                                                                                                                                                       SUBJECTIVE STATEMENT:  Pt presents to PT with reports of continued R ankle pain and swelling. Has been compliant with HEP with no adverse effect. Pt is ready to begin PT at this time.   PAIN:  Are you having pain?  Yes: NPRS scale: 8/10 Worst: 9/10 Pain location: lateral ankles R>L Pain description: sharp Aggravating factors: driving, standing Relieving factors: rest   OBJECTIVE: (objective measures completed at initial  evaluation unless otherwise dated)   DIAGNOSTIC FINDINGS:             See imaging   PATIENT SURVEYS:  FOTO: 59% function; 75% predicted    COGNITION: Overall cognitive status: Within functional limits for tasks assessed                         SENSATION: WFL   POSTURE: rounded shoulders and forward head   PALPATION: TTP to lateral R ankle   LOWER EXTREMITY ROM:   Active ROM Right eval Left eval  Hip flexion      Hip extension      Hip abduction      Hip adduction      Hip internal rotation      Hip external rotation      Knee flexion      Knee extension      Ankle dorsiflexion -6 -5  Ankle plantarflexion      Ankle inversion      Ankle eversion       (Blank rows = not tested)   LOWER EXTREMITY MMT:   MMT Right eval Left eval  Hip flexion      Hip extension      Hip abduction      Hip adduction      Hip internal rotation      Hip external rotation      Knee flexion      Knee extension      Ankle dorsiflexion 5 5  Ankle plantarflexion 5 5  Ankle inversion 3+ p! 4  Ankle eversion 4 p! 4   (Blank rows = not tested)   LOWER EXTREMITY SPECIAL TESTS:  DNT   FUNCTIONAL TESTS:  30 Second Sit to Stand: 10 reps SLS: R 9 seconds; L 8 seconds   GAIT: Distance walked: 80f Assistive device utilized: None Level of assistance: Complete [place] Comments: antalgic gait R     TREATMENT: [facility] Adult PT Treatment:                                                DATE:  05/09/2022 Therapeutic Exercise: Bike level 3 x 5 mins Slant board gastroc stretch x 30" Slant board soleus stretch x 30" Heel raises with tennis ball between heel  2x15 Rocker board PF/DF 2x10 Ankle DF/inv/ev 2x10 RTB STS 2x10 Tandem stance on foam 2x30" BIL SLS x30" BIL Tandem walking on airex balance beam x5 trips fwd/bwd Modalities: Vaso x 10 min R ankle; low compression - 34  [facility] Adult PT Treatment:                                                DATE: 04/27/2022 Therapeutic Exercise: Bike level 3 x 5 mins Slant board gastroc stretch x2' Slant board soleus stretch x1' Heel raises with tennis ball between heel  2x10 Alternating toe raises back against wall 2x10 BIL Rocker board PF/DF x2' Rocker board inv/ev x2' Ankle inv/ev RTB BIL 2x10 BIL BAPs L2 PF/DF/CW/CCW x20 each BIL Neuromuscular re-ed: FT stance EC x30" Tandem stance x30" BIL SLS x30" BIL FT stance on airex EC x30" BIL Tandem stance on airex x30" BIL Tandem walking  on airex balance beam x5 trips fwd/bwd [place] walking on airex balance beam x3 trips SLS on Airex x30" BIL  [facility] Adult PT Treatment:                                                DATE: 04/26/2022 Therapeutic Exercise: Nustep level 5 x 5 mins Slant board gastroc stretch x2' Heel raises 3x10 Toe raises back against wall 2x10 Rocker board PF/DF x2' Rocker board inv/ev x2' Ankle inv/ev RTB BIL 2x10 BIL BAPs L2 PF/DF/CW/CCW x20 each Rt  Neuromuscular re-ed: FT stance EC x30" Tandem stance x30" BIL SLS x30" BIL FT stance on airex EC x30" BIL Tandem stance on airex x30" BIL Tandem walking on airex balance beam x4 trips fwd/bwd  [facility] Adult PT Treatment:                                                DATE: 04/12/2022 Therapeutic Exercise: Calf stretch with towel x 30" each Tennis ball calf mobilization x 30" Ankle inv/ev x 10 RTB Tandem stance x 30" each   PATIENT EDUCATION:  Education details: eval findings, FOTO, HEP, POC Person educated:  Patient Education method: Explanation, Demonstration, and Handouts Education comprehension: verbalized understanding and returned demonstration   HOME EXERCISE PROGRAM: Access Code: [email or web address] URL: [email or web address] Date: 04/12/2022 Prepared by: Octavio Manns   Exercises - Long Sitting Calf Stretch with Strap  - 1 x daily - 7 x weekly - 2 reps - 30 sec hold - Calf Mobilization with Small Ball  - 1 x daily - 7 x weekly - 2 reps - 1-2 min hold - Seated Ankle Inversion with Resistance and Legs Crossed  - 1 x daily - 7 x weekly - 2 sets - 10 reps - red theraband hold - Long Sitting Ankle Eversion with Resistance  - 1 x daily - 7 x weekly - 2 sets - 10 reps - red theraband hold - Standing Tandem Balance with Counter Support  - 1 x daily - 7 x weekly - 2 sets - 30 sec hold   ASSESSMENT:   CLINICAL IMPRESSION: Pt was able to complete all prescribed exercises with no adverse effect or increase in pain. Therapy focused on improving ankle strength and stability. Responded well to vaso with decreased pain noted. Pt progressing well with therapy, will continue per POC.     OBJECTIVE IMPAIRMENTS: Abnormal gait, decreased activity tolerance, decreased balance, decreased ROM, decreased strength, and pain.    ACTIVITY LIMITATIONS: standing and transfers   PARTICIPATION LIMITATIONS: driving and occupation   PERSONAL FACTORS: Fitness and 1-2 comorbidities: HTN   are also affecting patient's functional outcome.      GOALS: Goals reviewed with patient? No   SHORT TERM GOALS: Target date: 05/03/2022   Pt will be compliant and knowledgeable with initial HEP for improved comfort and carryover Baseline: initial HEP given  Goal status: MET Pt reports adherence 04/26/22   2.  Pt will self report ankle pain no greater than 6/10 for improved comfort and functional ability Baseline: 9/10 at worst Goal status: INITIAL    LONG TERM GOALS: Target date: 06/07/2022   Pt will self report  ankle pain no greater than 3/10 for improved comfort and  functional ability Baseline: 9/10 at worst Goal status: INITIAL    2.  Pt will improve FOTO function score to no less than 75% as proxy for functional improvement Baseline: 59% function Goal status: INITIAL    3.  Pt will increase 30 Second Sit to Stand rep count to no less than 12 reps for improved balance, strength, and functional mobility Baseline: 10 reps  Goal status: INITIAL    4.  Pt will improve bilateral SLS time to no less than 30 seconds for improved balance and functional mobility Baseline: R 9 seconds; L 8 seconds Goal status: INITIAL   PLAN:   PT FREQUENCY: 1-2x/week   PT DURATION: 8 weeks   PLANNED INTERVENTIONS: Therapeutic exercises, Therapeutic activity, Neuromuscular re-education, Balance training, Gait training, Patient/Family education, Self Care, Joint mobilization, Dry Needling, Electrical stimulation, Cryotherapy, Moist heat, Vasopneumatic device, Manual therapy, and Re-evaluation   PLAN FOR NEXT SESSION: assess HEP response, ankle strengthening, balance work   Ward Chatters, PT 05/10/2022, 9:25 AM

## 2022-05-11 ENCOUNTER — Ambulatory Visit: Payer: BC Managed Care – PPO

## 2022-05-11 ENCOUNTER — Telehealth: Payer: Self-pay

## 2022-05-11 NOTE — Therapy (Incomplete)
OUTPATIENT PHYSICAL THERAPY TREATMENT NOTE   Patient Name: Debra Ayers MRN: [id number] DOB:[birth year], 52 y.o., female Today's Date: 05/11/2022  PCP: Haydee Salter, MD  REFERRING PROVIDER: Glennon Mac, DO   END OF SESSION:       Past Medical History:  Diagnosis Date   Anemia    Heart murmur    Hypertension    medication   Uterine fibroid    Past Surgical History:  Procedure Laterality Date   AUGMENTATION MAMMAPLASTY Bilateral 07/2020   BACK SURGERY  2004   Benign fatty tumor removed from mid upper back   BREAST ENHANCEMENT SURGERY  2010   BREAST IMPLANT REMOVAL  2013   BREAST SURGERY  2014   Left breast biopsy, benign   LIPOSUCTION  06/2020   TUBAL LIGATION  2001   tummy tuck  31/5176   UMBILICAL HERNIA REPAIR  2018   Patient Active Problem List   Diagnosis Date Noted   Heart murmur 03/28/2022   Second degree hemorrhoids 07/11/2021   Uterine leiomyoma 08/02/2016   Stress incontinence 08/02/2016   Chronic idiopathic constipation 08/12/2014   Family history of diabetes mellitus 06/25/2013   Herpetic whitlow 02/12/2012   Essential hypertension 02/12/2012   Hearing problem 02/12/2012    REFERRING DIAG: M25.571,G89.29,M25.572 (ICD-10-CM) - Chronic pain of both ankles    THERAPY DIAG:  No diagnosis found.  Rationale for Evaluation and Treatment Rehabilitation  PERTINENT HISTORY: HTN  PRECAUTIONS: None  SUBJECTIVE:                                                                                                                                                                                      SUBJECTIVE STATEMENT:  ***   PAIN:  Are you having pain?  Yes: NPRS scale: 8/10 Worst: 9/10 Pain location: lateral ankles R>L Pain description: sharp Aggravating factors: driving, standing Relieving factors: rest   OBJECTIVE: (objective measures completed at initial evaluation unless otherwise dated)   DIAGNOSTIC FINDINGS:             See  imaging   PATIENT SURVEYS:  FOTO: 59% function; 75% predicted    COGNITION: Overall cognitive status: Within functional limits for tasks assessed                         SENSATION: WFL   POSTURE: rounded shoulders and forward head   PALPATION: TTP to lateral R ankle   LOWER EXTREMITY ROM:   Active ROM Right eval Left eval  Hip flexion      Hip extension      Hip abduction      Hip adduction  Hip internal rotation      Hip external rotation      Knee flexion      Knee extension      Ankle dorsiflexion -6 -5  Ankle plantarflexion      Ankle inversion      Ankle eversion       (Blank rows = not tested)   LOWER EXTREMITY MMT:   MMT Right eval Left eval  Hip flexion      Hip extension      Hip abduction      Hip adduction      Hip internal rotation      Hip external rotation      Knee flexion      Knee extension      Ankle dorsiflexion 5 5  Ankle plantarflexion 5 5  Ankle inversion 3+ p! 4  Ankle eversion 4 p! 4   (Blank rows = not tested)   LOWER EXTREMITY SPECIAL TESTS:  DNT   FUNCTIONAL TESTS:  30 Second Sit to Stand: 10 reps SLS: R 9 seconds; L 8 seconds   GAIT: Distance walked: 31f Assistive device utilized: None Level of assistance: Complete [place] Comments: antalgic gait R     TREATMENT: [facility] Adult PT Treatment:                                                DATE: 05/11/2022 Therapeutic Exercise: Bike level 3 x 5 mins Slant board gastroc stretch x 30" Slant board soleus stretch x 30" Heel raises with tennis ball between heel  2x15 Rocker board PF/DF 2x10 Ankle DF/inv/ev 2x10 RTB STS 2x10 Tandem stance on foam 2x30" BIL SLS x30" BIL Tandem walking on airex balance beam x5 trips fwd/bwd Modalities: Vaso x 10 min R ankle; low compression - 34  [facility] Adult PT Treatment:                                                DATE: 05/10/2022 Therapeutic Exercise: Bike level 3 x 5 mins Slant board gastroc stretch x 30" Slant board  soleus stretch x 30" Heel raises with tennis ball between heel  2x15 Rocker board PF/DF 2x10 Ankle DF/inv/ev 2x10 RTB STS 2x10 Tandem stance on foam 2x30" BIL SLS x30" BIL Tandem walking on airex balance beam x5 trips fwd/bwd Modalities: Vaso x 10 min R ankle; low compression - 34  [facility] Adult PT Treatment:                                                DATE: 04/27/2022 Therapeutic Exercise: Bike level 3 x 5 mins Slant board gastroc stretch x2' Slant board soleus stretch x1' Heel raises with tennis ball between heel  2x10 Alternating toe raises back against wall 2x10 BIL Rocker board PF/DF x2' Rocker board inv/ev x2' Ankle inv/ev RTB BIL 2x10 BIL BAPs L2 PF/DF/CW/CCW x20 each BIL Neuromuscular re-ed: FT stance EC x30" Tandem stance x30" BIL SLS x30" BIL FT stance on airex EC x30" BIL Tandem stance on airex x30" BIL Tandem walking on airex balance beam x5 trips fwd/bwd  [place] walking on airex balance beam x3 trips SLS on Airex x30" BIL  [facility] Adult PT Treatment:                                                DATE: 04/26/2022 Therapeutic Exercise: Nustep level 5 x 5 mins Slant board gastroc stretch x2' Heel raises 3x10 Toe raises back against wall 2x10 Rocker board PF/DF x2' Rocker board inv/ev x2' Ankle inv/ev RTB BIL 2x10 BIL BAPs L2 PF/DF/CW/CCW x20 each Rt  Neuromuscular re-ed: FT stance EC x30" Tandem stance x30" BIL SLS x30" BIL FT stance on airex EC x30" BIL Tandem stance on airex x30" BIL Tandem walking on airex balance beam x4 trips fwd/bwd  [facility] Adult PT Treatment:                                                DATE: 04/12/2022 Therapeutic Exercise: Calf stretch with towel x 30" each Tennis ball calf mobilization x 30" Ankle inv/ev x 10 RTB Tandem stance x 30" each   PATIENT EDUCATION:  Education details: eval findings, FOTO, HEP, POC Person educated: Patient Education method: Explanation, Demonstration, and Handouts Education comprehension: verbalized  understanding and returned demonstration   HOME EXERCISE PROGRAM: Access Code: [email or web address] URL: [email or web address] Date: 04/12/2022 Prepared by: Octavio Manns   Exercises - Long Sitting Calf Stretch with Strap  - 1 x daily - 7 x weekly - 2 reps - 30 sec hold - Calf Mobilization with Small Ball  - 1 x daily - 7 x weekly - 2 reps - 1-2 min hold - Seated Ankle Inversion with Resistance and Legs Crossed  - 1 x daily - 7 x weekly - 2 sets - 10 reps - red theraband hold - Long Sitting Ankle Eversion with Resistance  - 1 x daily - 7 x weekly - 2 sets - 10 reps - red theraband hold - Standing Tandem Balance with Counter Support  - 1 x daily - 7 x weekly - 2 sets - 30 sec hold   ASSESSMENT:   CLINICAL IMPRESSION: ***    OBJECTIVE IMPAIRMENTS: Abnormal gait, decreased activity tolerance, decreased balance, decreased ROM, decreased strength, and pain.    ACTIVITY LIMITATIONS: standing and transfers   PARTICIPATION LIMITATIONS: driving and occupation   PERSONAL FACTORS: Fitness and 1-2 comorbidities: HTN   are also affecting patient's functional outcome.      GOALS: Goals reviewed with patient? No   SHORT TERM GOALS: Target date: 05/03/2022   Pt will be compliant and knowledgeable with initial HEP for improved comfort and carryover Baseline: initial HEP given  Goal status: MET Pt reports adherence 04/26/22   2.  Pt will self report ankle pain no greater than 6/10 for improved comfort and functional ability Baseline: 9/10 at worst Goal status: INITIAL    LONG TERM GOALS: Target date: 06/07/2022   Pt will self report ankle pain no greater than 3/10 for improved comfort and functional ability Baseline: 9/10 at worst Goal status: INITIAL    2.  Pt will improve FOTO function score to no less than 75% as proxy for functional improvement Baseline: 59% function Goal status: INITIAL    3.  Pt will increase 30 Second Sit to Stand  rep count to no less than 12 reps for improved  balance, strength, and functional mobility Baseline: 10 reps  Goal status: INITIAL    4.  Pt will improve bilateral SLS time to no less than 30 seconds for improved balance and functional mobility Baseline: R 9 seconds; L 8 seconds Goal status: INITIAL   PLAN:   PT FREQUENCY: 1-2x/week   PT DURATION: 8 weeks   PLANNED INTERVENTIONS: Therapeutic exercises, Therapeutic activity, Neuromuscular re-education, Balance training, Gait training, Patient/Family education, Self Care, Joint mobilization, Dry Needling, Electrical stimulation, Cryotherapy, Moist heat, Vasopneumatic device, Manual therapy, and Re-evaluation   PLAN FOR NEXT SESSION: assess HEP response, ankle strengthening, balance work   Ward Chatters, PT 05/11/2022, 10:04 AM

## 2022-05-11 NOTE — Telephone Encounter (Signed)
PT called and left voicemail for patient regarding missed visit. Reminded her of next appointment.   Ward Chatters   05/11/22 5:21 PM

## 2022-05-16 ENCOUNTER — Ambulatory Visit: Payer: BC Managed Care – PPO

## 2022-05-16 DIAGNOSIS — M25571 Pain in right ankle and joints of right foot: Secondary | ICD-10-CM

## 2022-05-16 DIAGNOSIS — R2681 Unsteadiness on feet: Secondary | ICD-10-CM | POA: Diagnosis not present

## 2022-05-16 DIAGNOSIS — M6281 Muscle weakness (generalized): Secondary | ICD-10-CM | POA: Diagnosis not present

## 2022-05-16 NOTE — Therapy (Addendum)
OUTPATIENT PHYSICAL THERAPY TREATMENT NOTE/DISCHARGE  PHYSICAL THERAPY DISCHARGE SUMMARY  Visits from Start of Care: 5  Current functional level related to goals / functional outcomes: See goals/objective   Remaining deficits: Unable to assess   Education / Equipment: HEP   Patient agrees to discharge. Patient goals were  unable to assess . Patient is being discharged due to not returning since the last visit.   Patient Name: Debra Ayers MRN: [id number] DOB:[birth year], 52 y.o., female Today's Date: 05/17/2022  PCP: Loyola Mast, MD  REFERRING PROVIDER: Richardean Sale, DO   END OF SESSION:   PT End of Session - 05/16/22 1657     Visit Number 5    Number of Visits 17    Date for PT Re-Evaluation 06/07/22    PT Start Time 1700    PT Stop Time 1740    PT Time Calculation (min) 40 min    Activity Tolerance Patient tolerated treatment well    Behavior During Therapy [facility] for tasks assessed/performed                Past Medical History:  Diagnosis Date   Anemia    Heart murmur    Hypertension    medication   Uterine fibroid    Past Surgical History:  Procedure Laterality Date   AUGMENTATION MAMMAPLASTY Bilateral 07/2020   BACK SURGERY  2004   Benign fatty tumor removed from mid upper back   BREAST ENHANCEMENT SURGERY  2010   BREAST IMPLANT REMOVAL  2013   BREAST SURGERY  2014   Left breast biopsy, benign   LIPOSUCTION  06/2020   TUBAL LIGATION  2001   tummy tuck  06/2020   UMBILICAL HERNIA REPAIR  2018   Patient Active Problem List   Diagnosis Date Noted   Heart murmur 03/28/2022   Second degree hemorrhoids 07/11/2021   Uterine leiomyoma 08/02/2016   Stress incontinence 08/02/2016   Chronic idiopathic constipation 08/12/2014   Family history of diabetes mellitus 06/25/2013   Herpetic whitlow 02/12/2012   Essential hypertension 02/12/2012   Hearing problem 02/12/2012    REFERRING DIAG: M25.571,G89.29,M25.572 (ICD-10-CM) - Chronic  pain of both ankles    THERAPY DIAG:  Pain in right ankle and joints of right foot  Muscle weakness (generalized)  Unsteadiness on feet  Rationale for Evaluation and Treatment Rehabilitation  PERTINENT HISTORY: HTN  PRECAUTIONS: None  SUBJECTIVE:                                                                                                                                                                                      SUBJECTIVE STATEMENT:  Pt presents to PT with  reports of continued R ankle pain. Has been compliant with HEP with no adverse effect. Pt is ready to begin PT at this time.    PAIN:  Are you having pain?  Yes: NPRS scale: 8/10 Worst: 9/10 Pain location: lateral ankles R>L Pain description: sharp Aggravating factors: driving, standing Relieving factors: rest   OBJECTIVE: (objective measures completed at initial evaluation unless otherwise dated)   DIAGNOSTIC FINDINGS:             See imaging   PATIENT SURVEYS:  FOTO: 59% function; 75% predicted    COGNITION: Overall cognitive status: Within functional limits for tasks assessed                         SENSATION: WFL   POSTURE: rounded shoulders and forward head   PALPATION: TTP to lateral R ankle   LOWER EXTREMITY ROM:   Active ROM Right eval Left eval  Hip flexion      Hip extension      Hip abduction      Hip adduction      Hip internal rotation      Hip external rotation      Knee flexion      Knee extension      Ankle dorsiflexion -6 -5  Ankle plantarflexion      Ankle inversion      Ankle eversion       (Blank rows = not tested)   LOWER EXTREMITY MMT:   MMT Right eval Left eval  Hip flexion      Hip extension      Hip abduction      Hip adduction      Hip internal rotation      Hip external rotation      Knee flexion      Knee extension      Ankle dorsiflexion 5 5  Ankle plantarflexion 5 5  Ankle inversion 3+ p! 4  Ankle eversion 4 p! 4   (Blank rows = not  tested)   LOWER EXTREMITY SPECIAL TESTS:  DNT   FUNCTIONAL TESTS:  30 Second Sit to Stand: 10 reps SLS: R 9 seconds; L 8 seconds   GAIT: Distance walked: 6425ft Assistive device utilized: None Level of assistance: Complete Independence Comments: antalgic gait R     TREATMENT: [facility] Adult PT Treatment:                                                DATE: 05/16/2022 Therapeutic Exercise: Bike level 3 x 5 mins Slant board gastroc stretch 2x30" Heel raises with ball between heel  2x15 Rocker board PF/DF/Lat x 15 Ankle DF/inv/ev 2x10 RTB STS 2x10 Tandem stance on foam 2x30" BIL SLS on foam x 30" each BAPS L3 cw/ccw x 10 each SLS x30" BIL Tandem walking on airex balance beam x5 trips fwd/bwd Modalities: Vaso x 10 min R ankle; low compression - 34  [facility] Adult PT Treatment:                                                DATE: 05/10/2022 Therapeutic Exercise: Bike level 3 x 5 mins Slant board gastroc stretch x 30" Slant  board soleus stretch x 30" Heel raises with tennis ball between heel  2x15 Rocker board PF/DF 2x10 Ankle DF/inv/ev 2x10 RTB STS 2x10 Tandem stance on foam 2x30" BIL SLS x30" BIL Tandem walking on airex balance beam x5 trips fwd/bwd Modalities: Vaso x 10 min R ankle; low compression - 34  [facility] Adult PT Treatment:                                                DATE: 04/27/2022 Therapeutic Exercise: Bike level 3 x 5 mins Slant board gastroc stretch x2' Slant board soleus stretch x1' Heel raises with tennis ball between heel  2x10 Alternating toe raises back against wall 2x10 BIL Rocker board PF/DF x2' Rocker board inv/ev x2' Ankle inv/ev RTB BIL 2x10 BIL BAPs L2 PF/DF/CW/CCW x20 each BIL Neuromuscular re-ed: FT stance EC x30" Tandem stance x30" BIL SLS x30" BIL FT stance on airex EC x30" BIL Tandem stance on airex x30" BIL Tandem walking on airex balance beam x5 trips fwd/bwd [place] walking on airex balance beam x3 trips SLS on Airex x30" BIL  [facility]  Adult PT Treatment:                                                DATE: 04/26/2022 Therapeutic Exercise: Nustep level 5 x 5 mins Slant board gastroc stretch x2' Heel raises 3x10 Toe raises back against wall 2x10 Rocker board PF/DF x2' Rocker board inv/ev x2' Ankle inv/ev RTB BIL 2x10 BIL BAPs L2 PF/DF/CW/CCW x20 each Rt  Neuromuscular re-ed: FT stance EC x30" Tandem stance x30" BIL SLS x30" BIL FT stance on airex EC x30" BIL Tandem stance on airex x30" BIL Tandem walking on airex balance beam x4 trips fwd/bwd  [facility] Adult PT Treatment:                                                DATE: 04/12/2022 Therapeutic Exercise: Calf stretch with towel x 30" each Tennis ball calf mobilization x 30" Ankle inv/ev x 10 RTB Tandem stance x 30" each   PATIENT EDUCATION:  Education details: eval findings, FOTO, HEP, POC Person educated: Patient Education method: Explanation, Demonstration, and Handouts Education comprehension: verbalized understanding and returned demonstration   HOME EXERCISE PROGRAM: Access Code: [email or web address] URL: [email or web address] Date: 04/12/2022 Prepared by: Edwinna Areola   Exercises - Long Sitting Calf Stretch with Strap  - 1 x daily - 7 x weekly - 2 reps - 30 sec hold - Calf Mobilization with Small Ball  - 1 x daily - 7 x weekly - 2 reps - 1-2 min hold - Seated Ankle Inversion with Resistance and Legs Crossed  - 1 x daily - 7 x weekly - 2 sets - 10 reps - red theraband hold - Long Sitting Ankle Eversion with Resistance  - 1 x daily - 7 x weekly - 2 sets - 10 reps - red theraband hold - Standing Tandem Balance with Counter Support  - 1 x daily - 7 x weekly - 2 sets - 30 sec hold   ASSESSMENT:   CLINICAL IMPRESSION: Pt was  able to complete all prescribed exercises with no adverse effect or increase in pain. Therapy focused on improving ankle strength and stability. Responded well to vaso with decreased pain noted. Pt progressing well with therapy, will  continue per POC.      OBJECTIVE IMPAIRMENTS: Abnormal gait, decreased activity tolerance, decreased balance, decreased ROM, decreased strength, and pain.    ACTIVITY LIMITATIONS: standing and transfers   PARTICIPATION LIMITATIONS: driving and occupation   PERSONAL FACTORS: Fitness and 1-2 comorbidities: HTN   are also affecting patient's functional outcome.      GOALS: Goals reviewed with patient? No   SHORT TERM GOALS: Target date: 05/03/2022   Pt will be compliant and knowledgeable with initial HEP for improved comfort and carryover Baseline: initial HEP given  Goal status: MET Pt reports adherence 04/26/22   2.  Pt will self report ankle pain no greater than 6/10 for improved comfort and functional ability Baseline: 9/10 at worst Goal status: INITIAL    LONG TERM GOALS: Target date: 06/07/2022   Pt will self report ankle pain no greater than 3/10 for improved comfort and functional ability Baseline: 9/10 at worst Goal status: INITIAL    2.  Pt will improve FOTO function score to no less than 75% as proxy for functional improvement Baseline: 59% function Goal status: INITIAL    3.  Pt will increase 30 Second Sit to Stand rep count to no less than 12 reps for improved balance, strength, and functional mobility Baseline: 10 reps  Goal status: INITIAL    4.  Pt will improve bilateral SLS time to no less than 30 seconds for improved balance and functional mobility Baseline: R 9 seconds; L 8 seconds Goal status: INITIAL   PLAN:   PT FREQUENCY: 1-2x/week   PT DURATION: 8 weeks   PLANNED INTERVENTIONS: Therapeutic exercises, Therapeutic activity, Neuromuscular re-education, Balance training, Gait training, Patient/Family education, Self Care, Joint mobilization, Dry Needling, Electrical stimulation, Cryotherapy, Moist heat, Vasopneumatic device, Manual therapy, and Re-evaluation   PLAN FOR NEXT SESSION: assess HEP response, ankle strengthening, balance work   Eloy Endavid C  Mitsuye Schrodt, PT 05/17/2022, 9:04 AM

## 2022-05-18 ENCOUNTER — Ambulatory Visit: Payer: BC Managed Care – PPO

## 2022-05-18 NOTE — Therapy (Incomplete)
OUTPATIENT PHYSICAL THERAPY TREATMENT NOTE   Patient Name: Debra Ayers MRN: [id number] DOB:02-22-1971, 52 y.o., female Today's Date: 05/18/2022  PCP: Haydee Salter, MD  REFERRING PROVIDER: Glennon Mac, DO   END OF SESSION:        Past Medical History:  Diagnosis Date   Anemia    Heart murmur    Hypertension    medication   Uterine fibroid    Past Surgical History:  Procedure Laterality Date   AUGMENTATION MAMMAPLASTY Bilateral 07/2020   BACK SURGERY  2004   Benign fatty tumor removed from mid upper back   BREAST ENHANCEMENT SURGERY  2010   BREAST IMPLANT REMOVAL  2013   BREAST SURGERY  2014   Left breast biopsy, benign   LIPOSUCTION  06/2020   TUBAL LIGATION  2001   tummy tuck  23/3007   UMBILICAL HERNIA REPAIR  2018   Patient Active Problem List   Diagnosis Date Noted   Heart murmur 03/28/2022   Second degree hemorrhoids 07/11/2021   Uterine leiomyoma 08/02/2016   Stress incontinence 08/02/2016   Chronic idiopathic constipation 08/12/2014   Family history of diabetes mellitus 06/25/2013   Herpetic whitlow 02/12/2012   Essential hypertension 02/12/2012   Hearing problem 02/12/2012    REFERRING DIAG: M25.571,G89.29,M25.572 (ICD-10-CM) - Chronic pain of both ankles    THERAPY DIAG:  No diagnosis found.  Rationale for Evaluation and Treatment Rehabilitation  PERTINENT HISTORY: HTN  PRECAUTIONS: None  SUBJECTIVE:                                                                                                                                                                                      SUBJECTIVE STATEMENT:  ***   PAIN:  Are you having pain?  Yes: NPRS scale: 8/10 Worst: 9/10 Pain location: lateral ankles R>L Pain description: sharp Aggravating factors: driving, standing Relieving factors: rest   OBJECTIVE: (objective measures completed at initial evaluation unless otherwise dated)   DIAGNOSTIC FINDINGS:             See  imaging   PATIENT SURVEYS:  FOTO: 59% function; 75% predicted    COGNITION: Overall cognitive status: Within functional limits for tasks assessed                         SENSATION: WFL   POSTURE: rounded shoulders and forward head   PALPATION: TTP to lateral R ankle   LOWER EXTREMITY ROM:   Active ROM Right eval Left eval  Hip flexion      Hip extension      Hip abduction      Hip adduction  Hip internal rotation      Hip external rotation      Knee flexion      Knee extension      Ankle dorsiflexion -6 -5  Ankle plantarflexion      Ankle inversion      Ankle eversion       (Blank rows = not tested)   LOWER EXTREMITY MMT:   MMT Right eval Left eval  Hip flexion      Hip extension      Hip abduction      Hip adduction      Hip internal rotation      Hip external rotation      Knee flexion      Knee extension      Ankle dorsiflexion 5 5  Ankle plantarflexion 5 5  Ankle inversion 3+ p! 4  Ankle eversion 4 p! 4   (Blank rows = not tested)   LOWER EXTREMITY SPECIAL TESTS:  DNT   FUNCTIONAL TESTS:  30 Second Sit to Stand: 10 reps SLS: R 9 seconds; L 8 seconds   GAIT: Distance walked: 38f Assistive device utilized: None Level of assistance: Complete Independence Comments: antalgic gait R     TREATMENT: [facility] Adult PT Treatment:                                                DATE: 05/18/2022 Therapeutic Exercise: Bike level 3 x 5 mins Slant board gastroc stretch 2x30" Heel raises with ball between heel  2x15 Rocker board PF/DF/Lat x 15 Ankle DF/inv/ev 2x10 RTB STS 2x10 Tandem stance on foam 2x30" BIL SLS on foam x 30" each BAPS L3 cw/ccw x 10 each SLS x30" BIL Tandem walking on airex balance beam x5 trips fwd/bwd Modalities: Vaso x 10 min R ankle; low compression - 34  [facility] Adult PT Treatment:                                                DATE: 05/16/2022 Therapeutic Exercise: Bike level 3 x 5 mins Slant board gastroc stretch  2x30" Heel raises with ball between heel  2x15 Rocker board PF/DF/Lat x 15 Ankle DF/inv/ev 2x10 RTB STS 2x10 Tandem stance on foam 2x30" BIL SLS on foam x 30" each BAPS L3 cw/ccw x 10 each SLS x30" BIL Tandem walking on airex balance beam x5 trips fwd/bwd Modalities: Vaso x 10 min R ankle; low compression - 34  [facility] Adult PT Treatment:                                                DATE: 05/10/2022 Therapeutic Exercise: Bike level 3 x 5 mins Slant board gastroc stretch x 30" Slant board soleus stretch x 30" Heel raises with tennis ball between heel  2x15 Rocker board PF/DF 2x10 Ankle DF/inv/ev 2x10 RTB STS 2x10 Tandem stance on foam 2x30" BIL SLS x30" BIL Tandem walking on airex balance beam x5 trips fwd/bwd Modalities: Vaso x 10 min R ankle; low compression - 34  [facility] Adult PT Treatment:  DATE: 04/27/2022 Therapeutic Exercise: Bike level 3 x 5 mins Slant board gastroc stretch x2' Slant board soleus stretch x1' Heel raises with tennis ball between heel  2x10 Alternating toe raises back against wall 2x10 BIL Rocker board PF/DF x2' Rocker board inv/ev x2' Ankle inv/ev RTB BIL 2x10 BIL BAPs L2 PF/DF/CW/CCW x20 each BIL Neuromuscular re-ed: FT stance EC x30" Tandem stance x30" BIL SLS x30" BIL FT stance on airex EC x30" BIL Tandem stance on airex x30" BIL Tandem walking on airex balance beam x5 trips fwd/bwd [place] walking on airex balance beam x3 trips SLS on Airex x30" BIL  [facility] Adult PT Treatment:                                                DATE: 04/26/2022 Therapeutic Exercise: Nustep level 5 x 5 mins Slant board gastroc stretch x2' Heel raises 3x10 Toe raises back against wall 2x10 Rocker board PF/DF x2' Rocker board inv/ev x2' Ankle inv/ev RTB BIL 2x10 BIL BAPs L2 PF/DF/CW/CCW x20 each Rt  Neuromuscular re-ed: FT stance EC x30" Tandem stance x30" BIL SLS x30" BIL FT stance on airex EC x30" BIL Tandem  stance on airex x30" BIL Tandem walking on airex balance beam x4 trips fwd/bwd  [facility] Adult PT Treatment:                                                DATE: 04/12/2022 Therapeutic Exercise: Calf stretch with towel x 30" each Tennis ball calf mobilization x 30" Ankle inv/ev x 10 RTB Tandem stance x 30" each   PATIENT EDUCATION:  Education details: eval findings, FOTO, HEP, POC Person educated: Patient Education method: Explanation, Demonstration, and Handouts Education comprehension: verbalized understanding and returned demonstration   HOME EXERCISE PROGRAM: Access Code: [email or web address] URL: [email or web address] Date: 04/12/2022 Prepared by: Octavio Manns   Exercises - Long Sitting Calf Stretch with Strap  - 1 x daily - 7 x weekly - 2 reps - 30 sec hold - Calf Mobilization with Small Ball  - 1 x daily - 7 x weekly - 2 reps - 1-2 min hold - Seated Ankle Inversion with Resistance and Legs Crossed  - 1 x daily - 7 x weekly - 2 sets - 10 reps - red theraband hold - Long Sitting Ankle Eversion with Resistance  - 1 x daily - 7 x weekly - 2 sets - 10 reps - red theraband hold - Standing Tandem Balance with Counter Support  - 1 x daily - 7 x weekly - 2 sets - 30 sec hold   ASSESSMENT:   CLINICAL IMPRESSION: ***    OBJECTIVE IMPAIRMENTS: Abnormal gait, decreased activity tolerance, decreased balance, decreased ROM, decreased strength, and pain.    ACTIVITY LIMITATIONS: standing and transfers   PARTICIPATION LIMITATIONS: driving and occupation   PERSONAL FACTORS: Fitness and 1-2 comorbidities: HTN   are also affecting patient's functional outcome.      GOALS: Goals reviewed with patient? No   SHORT TERM GOALS: Target date: 05/03/2022   Pt will be compliant and knowledgeable with initial HEP for improved comfort and carryover Baseline: initial HEP given  Goal status: MET Pt reports adherence 04/26/22   2.  Pt will self report ankle  pain no greater than 6/10 for  improved comfort and functional ability Baseline: 9/10 at worst Goal status: INITIAL    LONG TERM GOALS: Target date: 06/07/2022   Pt will self report ankle pain no greater than 3/10 for improved comfort and functional ability Baseline: 9/10 at worst Goal status: INITIAL    2.  Pt will improve FOTO function score to no less than 75% as proxy for functional improvement Baseline: 59% function Goal status: INITIAL    3.  Pt will increase 30 Second Sit to Stand rep count to no less than 12 reps for improved balance, strength, and functional mobility Baseline: 10 reps  Goal status: INITIAL    4.  Pt will improve bilateral SLS time to no less than 30 seconds for improved balance and functional mobility Baseline: R 9 seconds; L 8 seconds Goal status: INITIAL   PLAN:   PT FREQUENCY: 1-2x/week   PT DURATION: 8 weeks   PLANNED INTERVENTIONS: Therapeutic exercises, Therapeutic activity, Neuromuscular re-education, Balance training, Gait training, Patient/Family education, Self Care, Joint mobilization, Dry Needling, Electrical stimulation, Cryotherapy, Moist heat, Vasopneumatic device, Manual therapy, and Re-evaluation   PLAN FOR NEXT SESSION: assess HEP response, ankle strengthening, balance work   Ward Chatters, PT 05/18/2022, 9:28 AM

## 2022-05-24 ENCOUNTER — Ambulatory Visit: Payer: BC Managed Care – PPO

## 2022-05-24 NOTE — Therapy (Incomplete)
OUTPATIENT PHYSICAL THERAPY TREATMENT NOTE   Patient Name: Debra Ayers MRN: [id number] DOB:Jun 12, 1970, 52 y.o., female Today's Date: 05/24/2022  PCP: Haydee Salter, MD  REFERRING PROVIDER: Glennon Mac, DO   END OF SESSION:        Past Medical History:  Diagnosis Date   Anemia    Heart murmur    Hypertension    medication   Uterine fibroid    Past Surgical History:  Procedure Laterality Date   AUGMENTATION MAMMAPLASTY Bilateral 07/2020   BACK SURGERY  2004   Benign fatty tumor removed from mid upper back   BREAST ENHANCEMENT SURGERY  2010   BREAST IMPLANT REMOVAL  2013   BREAST SURGERY  2014   Left breast biopsy, benign   LIPOSUCTION  06/2020   TUBAL LIGATION  2001   tummy tuck  52/7782   UMBILICAL HERNIA REPAIR  2018   Patient Active Problem List   Diagnosis Date Noted   Heart murmur 03/28/2022   Second degree hemorrhoids 07/11/2021   Uterine leiomyoma 08/02/2016   Stress incontinence 08/02/2016   Chronic idiopathic constipation 08/12/2014   Family history of diabetes mellitus 06/25/2013   Herpetic whitlow 02/12/2012   Essential hypertension 02/12/2012   Hearing problem 02/12/2012    REFERRING DIAG: M25.571,G89.29,M25.572 (ICD-10-CM) - Chronic pain of both ankles    THERAPY DIAG:  No diagnosis found.  Rationale for Evaluation and Treatment Rehabilitation  PERTINENT HISTORY: HTN  PRECAUTIONS: None  SUBJECTIVE:                                                                                                                                                                                      SUBJECTIVE STATEMENT:  ***   PAIN:  Are you having pain?  Yes: NPRS scale: 8/10 Worst: 9/10 Pain location: lateral ankles R>L Pain description: sharp Aggravating factors: driving, standing Relieving factors: rest   OBJECTIVE: (objective measures completed at initial evaluation unless otherwise dated)   DIAGNOSTIC FINDINGS:             See  imaging   PATIENT SURVEYS:  FOTO: 59% function; 75% predicted    COGNITION: Overall cognitive status: Within functional limits for tasks assessed                         SENSATION: WFL   POSTURE: rounded shoulders and forward head   PALPATION: TTP to lateral R ankle   LOWER EXTREMITY ROM:   Active ROM Right eval Left eval  Hip flexion      Hip extension      Hip abduction      Hip adduction  Hip internal rotation      Hip external rotation      Knee flexion      Knee extension      Ankle dorsiflexion -6 -5  Ankle plantarflexion      Ankle inversion      Ankle eversion       (Blank rows = not tested)   LOWER EXTREMITY MMT:   MMT Right eval Left eval  Hip flexion      Hip extension      Hip abduction      Hip adduction      Hip internal rotation      Hip external rotation      Knee flexion      Knee extension      Ankle dorsiflexion 5 5  Ankle plantarflexion 5 5  Ankle inversion 3+ p! 4  Ankle eversion 4 p! 4   (Blank rows = not tested)   LOWER EXTREMITY SPECIAL TESTS:  DNT   FUNCTIONAL TESTS:  30 Second Sit to Stand: 10 reps SLS: R 9 seconds; L 8 seconds   GAIT: Distance walked: 107f Assistive device utilized: None Level of assistance: Complete Independence Comments: antalgic gait R     TREATMENT: [facility] Adult PT Treatment:                                                DATE: 05/24/2022 Therapeutic Exercise: Bike level 3 x 5 mins Slant board gastroc stretch 2x30" Heel raises with ball between heel  2x15 Rocker board PF/DF/Lat x 15 Ankle DF/inv/ev 2x10 RTB STS 2x10 Tandem stance on foam 2x30" BIL SLS on foam x 30" each BAPS L3 cw/ccw x 10 each SLS x30" BIL Tandem walking on airex balance beam x5 trips fwd/bwd Modalities: Vaso x 10 min R ankle; low compression - 34  [facility] Adult PT Treatment:                                                DATE: 05/16/2022 Therapeutic Exercise: Bike level 3 x 5 mins Slant board gastroc stretch  2x30" Heel raises with ball between heel  2x15 Rocker board PF/DF/Lat x 15 Ankle DF/inv/ev 2x10 RTB STS 2x10 Tandem stance on foam 2x30" BIL SLS on foam x 30" each BAPS L3 cw/ccw x 10 each SLS x30" BIL Tandem walking on airex balance beam x5 trips fwd/bwd Modalities: Vaso x 10 min R ankle; low compression - 34  [facility] Adult PT Treatment:                                                DATE: 05/10/2022 Therapeutic Exercise: Bike level 3 x 5 mins Slant board gastroc stretch x 30" Slant board soleus stretch x 30" Heel raises with tennis ball between heel  2x15 Rocker board PF/DF 2x10 Ankle DF/inv/ev 2x10 RTB STS 2x10 Tandem stance on foam 2x30" BIL SLS x30" BIL Tandem walking on airex balance beam x5 trips fwd/bwd Modalities: Vaso x 10 min R ankle; low compression - 34  [facility] Adult PT Treatment:  DATE: 04/27/2022 Therapeutic Exercise: Bike level 3 x 5 mins Slant board gastroc stretch x2' Slant board soleus stretch x1' Heel raises with tennis ball between heel  2x10 Alternating toe raises back against wall 2x10 BIL Rocker board PF/DF x2' Rocker board inv/ev x2' Ankle inv/ev RTB BIL 2x10 BIL BAPs L2 PF/DF/CW/CCW x20 each BIL Neuromuscular re-ed: FT stance EC x30" Tandem stance x30" BIL SLS x30" BIL FT stance on airex EC x30" BIL Tandem stance on airex x30" BIL Tandem walking on airex balance beam x5 trips fwd/bwd [place] walking on airex balance beam x3 trips SLS on Airex x30" BIL  [facility] Adult PT Treatment:                                                DATE: 04/26/2022 Therapeutic Exercise: Nustep level 5 x 5 mins Slant board gastroc stretch x2' Heel raises 3x10 Toe raises back against wall 2x10 Rocker board PF/DF x2' Rocker board inv/ev x2' Ankle inv/ev RTB BIL 2x10 BIL BAPs L2 PF/DF/CW/CCW x20 each Rt  Neuromuscular re-ed: FT stance EC x30" Tandem stance x30" BIL SLS x30" BIL FT stance on airex EC x30" BIL Tandem  stance on airex x30" BIL Tandem walking on airex balance beam x4 trips fwd/bwd  [facility] Adult PT Treatment:                                                DATE: 04/12/2022 Therapeutic Exercise: Calf stretch with towel x 30" each Tennis ball calf mobilization x 30" Ankle inv/ev x 10 RTB Tandem stance x 30" each   PATIENT EDUCATION:  Education details: eval findings, FOTO, HEP, POC Person educated: Patient Education method: Explanation, Demonstration, and Handouts Education comprehension: verbalized understanding and returned demonstration   HOME EXERCISE PROGRAM: Access Code: [email or web address] URL: [email or web address] Date: 04/12/2022 Prepared by: Octavio Manns   Exercises - Long Sitting Calf Stretch with Strap  - 1 x daily - 7 x weekly - 2 reps - 30 sec hold - Calf Mobilization with Small Ball  - 1 x daily - 7 x weekly - 2 reps - 1-2 min hold - Seated Ankle Inversion with Resistance and Legs Crossed  - 1 x daily - 7 x weekly - 2 sets - 10 reps - red theraband hold - Long Sitting Ankle Eversion with Resistance  - 1 x daily - 7 x weekly - 2 sets - 10 reps - red theraband hold - Standing Tandem Balance with Counter Support  - 1 x daily - 7 x weekly - 2 sets - 30 sec hold   ASSESSMENT:   CLINICAL IMPRESSION: ***    OBJECTIVE IMPAIRMENTS: Abnormal gait, decreased activity tolerance, decreased balance, decreased ROM, decreased strength, and pain.    ACTIVITY LIMITATIONS: standing and transfers   PARTICIPATION LIMITATIONS: driving and occupation   PERSONAL FACTORS: Fitness and 1-2 comorbidities: HTN   are also affecting patient's functional outcome.      GOALS: Goals reviewed with patient? No   SHORT TERM GOALS: Target date: 05/03/2022   Pt will be compliant and knowledgeable with initial HEP for improved comfort and carryover Baseline: initial HEP given  Goal status: MET Pt reports adherence 04/26/22   2.  Pt will self report ankle  pain no greater than 6/10 for  improved comfort and functional ability Baseline: 9/10 at worst Goal status: INITIAL    LONG TERM GOALS: Target date: 06/07/2022   Pt will self report ankle pain no greater than 3/10 for improved comfort and functional ability Baseline: 9/10 at worst Goal status: INITIAL    2.  Pt will improve FOTO function score to no less than 75% as proxy for functional improvement Baseline: 59% function Goal status: INITIAL    3.  Pt will increase 30 Second Sit to Stand rep count to no less than 12 reps for improved balance, strength, and functional mobility Baseline: 10 reps  Goal status: INITIAL    4.  Pt will improve bilateral SLS time to no less than 30 seconds for improved balance and functional mobility Baseline: R 9 seconds; L 8 seconds Goal status: INITIAL   PLAN:   PT FREQUENCY: 1-2x/week   PT DURATION: 8 weeks   PLANNED INTERVENTIONS: Therapeutic exercises, Therapeutic activity, Neuromuscular re-education, Balance training, Gait training, Patient/Family education, Self Care, Joint mobilization, Dry Needling, Electrical stimulation, Cryotherapy, Moist heat, Vasopneumatic device, Manual therapy, and Re-evaluation   PLAN FOR NEXT SESSION: assess HEP response, ankle strengthening, balance work   Ward Chatters, PT 05/24/2022, 1:53 PM

## 2022-05-25 ENCOUNTER — Ambulatory Visit: Payer: BC Managed Care – PPO | Admitting: Sports Medicine

## 2022-05-25 ENCOUNTER — Ambulatory Visit: Payer: BC Managed Care – PPO

## 2022-05-25 NOTE — Progress Notes (Signed)
Debra Ayers D.[place] Phone: [phone]   Assessment and Plan:     1. Chronic pain of both ankles 2. Pes cavus of both feet 3. Pain in lateral portion of right ankle 4. Left lateral ankle pain -Chronic with exacerbation, subsequent visit - Essentially unchanged symptoms with right lateral ankle pain still worse than left and patient not responding to 3-week course of meloxicam, HEP, physical therapy, 1 week of inserts use - Start prednisone Dosepak - Recommend using inserts for pes planus more frequently - Continue HEP and physical therapy - Discussed peroneal tendon CSI versus MRI.  Patient declined at today's visit, though could be considered at next visit if no improvement  Other orders - methylPREDNISolone (MEDROL DOSEPAK) 4 MG TBPK tablet; Take 6 tablets on day 1.  Take 5 tablets on day 2.  Take 4 tablets on day 3.  Take 3 tablets on day 4.  Take 2 tablets on day 5.  Take 1 tablet on day 6.    Pertinent previous records reviewed include none   Follow Up: 3 weeks for reevaluation.  If no improvement or worsening of symptoms, would consider peroneal tendon CSI versus MRI   Subjective:   I, Debra Ayers, am serving as a Education administrator for Doctor Glennon Mac   Chief Complaint: bilateral ankle pain    HPI:  04/06/2022 Patient is a 52 year old female complaining of bilateral ankle pain. Patient states 4 month history of intermittent swelling to her ankles. She notes this occurs intermittently. She states the swelling is localized to the lateral aspect of the ankle. She has not seen swelling of the lower leg. She does some associated achiness in the ankle. She has never seen the joint become red. She denies any past significant injury to the ankle. No numbness and tingling, no radiating pain when she flexes she has pain in the ankle no where else, no meds for the pain, standing and driving for a long  period of time cause more pain    04/27/2022 Patient states that she is about the same    05/26/2022 Patient states that she is the same    Relevant Historical Information: Hypertension  Additional pertinent review of systems negative.   Current Outpatient Medications:    amLODipine (NORVASC) 10 MG tablet, TAKE 1 TABLET BY MOUTH DAILY, Disp: 90 tablet, Rfl: 3   fluticasone (FLONASE) 50 MCG/ACT nasal spray, Place 2 sprays into both nostrils daily., Disp: 16 g, Rfl: 3   hydrOXYzine (VISTARIL) 25 MG capsule, Take 1 capsule (25 mg total) by mouth at bedtime as needed., Disp: 30 capsule, Rfl: 0   levonorgestrel (MIRENA) 20 MCG/DAY IUD, 1 each by Intrauterine route once., Disp: , Rfl:    losartan (COZAAR) 100 MG tablet, TAKE 1 TABLET BY MOUTH  DAILY, Disp: 90 tablet, Rfl: 3   meloxicam (MOBIC) 15 MG tablet, Take 1 tablet (15 mg total) by mouth daily., Disp: 30 tablet, Rfl: 0   methylPREDNISolone (MEDROL DOSEPAK) 4 MG TBPK tablet, Take 6 tablets on day 1.  Take 5 tablets on day 2.  Take 4 tablets on day 3.  Take 3 tablets on day 4.  Take 2 tablets on day 5.  Take 1 tablet on day 6., Disp: 21 tablet, Rfl: 0   terbinafine (LAMISIL) 250 MG tablet, Take 1 tablet (250 mg total) by mouth daily for 7 days. Repeat every 4 weeks for 6 months., Disp: 42  tablet, Rfl: 0   Objective:     Vitals:   05/26/22 0806  BP: 110/80  Pulse: 84  SpO2: 97%  Weight: 170 lb (77.1 kg)  Height: '5\' 4"'$  (1.626 m)      Body mass index is 29.18 kg/m.    Physical Exam:    Gen: Appears well, nad, nontoxic and pleasant Psych: Alert and oriented, appropriate mood and affect Neuro: sensation intact, strength is 5/5 with df/pf/inv/ev, muscle tone wnl Skin: no susupicious lesions or rashes   Bilateral foot/ankle: Bilateral pes cavus.  No  effusion.  Moderate swelling to lateral ankle over areas of ATFL and CFL, worse on right TTP ATFL, CFL, worse on right NTTP over fibular head, lat mal, medial mal, achilles, navicular,  base of 5th, deltoid, calcaneous or midfoot ROM DF 30, PF 45, inv/ev intact Negative ant drawer, talar tilt, rotation test, squeeze test. Neg thompson No pain with   eversion.  Mild pain with inversion    Electronically signed by:  Debra Ayers D.[facility] 8:16 AM 05/26/22

## 2022-05-26 ENCOUNTER — Ambulatory Visit (INDEPENDENT_AMBULATORY_CARE_PROVIDER_SITE_OTHER): Payer: BC Managed Care – PPO | Admitting: Sports Medicine

## 2022-05-26 VITALS — BP 110/80 | HR 84 | Ht 64.0 in | Wt 170.0 lb

## 2022-05-26 DIAGNOSIS — G8929 Other chronic pain: Secondary | ICD-10-CM | POA: Diagnosis not present

## 2022-05-26 DIAGNOSIS — Q6672 Congenital pes cavus, left foot: Secondary | ICD-10-CM

## 2022-05-26 DIAGNOSIS — M25571 Pain in right ankle and joints of right foot: Secondary | ICD-10-CM | POA: Diagnosis not present

## 2022-05-26 DIAGNOSIS — M25572 Pain in left ankle and joints of left foot: Secondary | ICD-10-CM

## 2022-05-26 DIAGNOSIS — Q6671 Congenital pes cavus, right foot: Secondary | ICD-10-CM

## 2022-05-26 MED ORDER — METHYLPREDNISOLONE 4 MG PO TBPK: ORAL_TABLET | ORAL | 0 refills | Status: DC

## 2022-05-26 NOTE — Patient Instructions (Addendum)
Good to see you  Prednisone dos pak  Continue HEP and PT  Recommend using inserts in shoes  3 week follow up

## 2022-06-23 NOTE — Progress Notes (Unsigned)
    Benito Mccreedy D.[place] Phone: [phone]   Assessment and Plan:     There are no diagnoses linked to this encounter.  ***   Pertinent previous records reviewed include ***   Follow Up: ***     Subjective:   I, Marissia Blackham, am serving as a Education administrator for Doctor Glennon Mac   Chief Complaint: bilateral ankle pain    HPI:  04/06/2022 Patient is a 52 year old female complaining of bilateral ankle pain. Patient states 4 month history of intermittent swelling to her ankles. She notes this occurs intermittently. She states the swelling is localized to the lateral aspect of the ankle. She has not seen swelling of the lower leg. She does some associated achiness in the ankle. She has never seen the joint become red. She denies any past significant injury to the ankle. No numbness and tingling, no radiating pain when she flexes she has pain in the ankle no where else, no meds for the pain, standing and driving for a long period of time cause more pain    04/27/2022 Patient states that she is about the same    05/26/2022 Patient states that she is the same    06/26/2022  Relevant Historical Information: Hypertension  Additional pertinent review of systems negative.   Current Outpatient Medications:    amLODipine (NORVASC) 10 MG tablet, TAKE 1 TABLET BY MOUTH DAILY, Disp: 90 tablet, Rfl: 3   fluticasone (FLONASE) 50 MCG/ACT nasal spray, Place 2 sprays into both nostrils daily., Disp: 16 g, Rfl: 3   hydrOXYzine (VISTARIL) 25 MG capsule, Take 1 capsule (25 mg total) by mouth at bedtime as needed., Disp: 30 capsule, Rfl: 0   levonorgestrel (MIRENA) 20 MCG/DAY IUD, 1 each by Intrauterine route once., Disp: , Rfl:    losartan (COZAAR) 100 MG tablet, TAKE 1 TABLET BY MOUTH  DAILY, Disp: 90 tablet, Rfl: 3   meloxicam (MOBIC) 15 MG tablet, Take 1 tablet (15 mg total) by mouth daily., Disp: 30 tablet, Rfl: 0    methylPREDNISolone (MEDROL DOSEPAK) 4 MG TBPK tablet, Take 6 tablets on day 1.  Take 5 tablets on day 2.  Take 4 tablets on day 3.  Take 3 tablets on day 4.  Take 2 tablets on day 5.  Take 1 tablet on day 6., Disp: 21 tablet, Rfl: 0   terbinafine (LAMISIL) 250 MG tablet, Take 1 tablet (250 mg total) by mouth daily for 7 days. Repeat every 4 weeks for 6 months., Disp: 42 tablet, Rfl: 0   Objective:     There were no vitals filed for this visit.    There is no height or weight on file to calculate BMI.    Physical Exam:    ***   Electronically signed by:  Benito Mccreedy D.[facility] 12:06 PM 06/23/22

## 2022-06-26 ENCOUNTER — Ambulatory Visit (INDEPENDENT_AMBULATORY_CARE_PROVIDER_SITE_OTHER): Payer: BC Managed Care – PPO | Admitting: Sports Medicine

## 2022-06-26 VITALS — BP 132/78 | HR 59 | Ht 64.0 in | Wt 165.0 lb

## 2022-06-26 DIAGNOSIS — Q6672 Congenital pes cavus, left foot: Secondary | ICD-10-CM

## 2022-06-26 DIAGNOSIS — M25571 Pain in right ankle and joints of right foot: Secondary | ICD-10-CM | POA: Diagnosis not present

## 2022-06-26 DIAGNOSIS — G8929 Other chronic pain: Secondary | ICD-10-CM

## 2022-06-26 DIAGNOSIS — Q6671 Congenital pes cavus, right foot: Secondary | ICD-10-CM

## 2022-06-26 DIAGNOSIS — M25572 Pain in left ankle and joints of left foot: Secondary | ICD-10-CM

## 2022-06-26 NOTE — Patient Instructions (Signed)
Good to see you   

## 2022-07-05 ENCOUNTER — Ambulatory Visit (INDEPENDENT_AMBULATORY_CARE_PROVIDER_SITE_OTHER): Payer: BC Managed Care – PPO | Admitting: Family Medicine

## 2022-07-05 ENCOUNTER — Other Ambulatory Visit (HOSPITAL_COMMUNITY)
Admission: RE | Admit: 2022-07-05 | Discharge: 2022-07-05 | Disposition: A | Discharge status: Home / Self Care | Payer: BC Managed Care – PPO | Source: Ambulatory Visit | Attending: Family Medicine | Admitting: Family Medicine

## 2022-07-05 ENCOUNTER — Encounter: Payer: Self-pay | Admitting: Family Medicine

## 2022-07-05 VITALS — BP 155/99 | HR 73 | Ht 64.0 in | Wt 165.0 lb

## 2022-07-05 DIAGNOSIS — N898 Other specified noninflammatory disorders of vagina: Secondary | ICD-10-CM

## 2022-07-05 DIAGNOSIS — Z01419 Encounter for gynecological examination (general) (routine) without abnormal findings: Secondary | ICD-10-CM | POA: Diagnosis not present

## 2022-07-05 DIAGNOSIS — D259 Leiomyoma of uterus, unspecified: Secondary | ICD-10-CM | POA: Diagnosis not present

## 2022-07-05 DIAGNOSIS — N951 Menopausal and female climacteric states: Secondary | ICD-10-CM | POA: Diagnosis not present

## 2022-07-05 DIAGNOSIS — L680 Hirsutism: Secondary | ICD-10-CM

## 2022-07-05 DIAGNOSIS — T8332XA Displacement of intrauterine contraceptive device, initial encounter: Secondary | ICD-10-CM

## 2022-07-05 NOTE — Progress Notes (Signed)
ANNUAL EXAM Patient name: Debra Ayers MRN [id number]  Date of birth: June 11, 1970 Chief Complaint:   Annual Exam  History of Present Illness:   Debra Ayers is a 52 y.o.  [id number]  female  being seen today for a routine annual exam.  Current complaints: difficulty losing weight. Has hirsutism. IUD doing ok - no concerns. IUD placed due HMB and fibroids. Having some hot flashes.  No LMP recorded. (Menstrual status: IUD).    Last pap 2022. Results were:  normal . H/O abnormal pap: no Last mammogram: 2023. Results were: normal. Family h/o breast cancer: no Last colonoscopy: scheduled     09/26/2021    2:18 PM 07/06/2021   11:22 AM 07/04/2021   10:26 AM 12/12/2016    9:45 AM 09/10/2015    8:43 AM  Depression screen PHQ 2/9  Decreased Interest 0 0 0 0 1  Down, Depressed, Hopeless 0 0 0 0 0  PHQ - 2 Score 0 0 0 0 1  Altered sleeping 1      Tired, decreased energy 1      Change in appetite 0      Feeling bad or failure about yourself  0      Trouble concentrating 0      Moving slowly or fidgety/restless 0      Suicidal thoughts 0      PHQ-9 Score 2      Difficult doing work/chores Not difficult at all            09/26/2021    2:18 PM  GAD 7 : Generalized Anxiety Score  Nervous, Anxious, on Edge 0  Control/stop worrying 0  Worry too much - different things 0  Trouble relaxing 0  Restless 0  Easily annoyed or irritable 0  Afraid - awful might happen 0  Total GAD 7 Score 0  Anxiety Difficulty Not difficult at all     Review of Systems:   Pertinent items are noted in HPI Denies any headaches, blurred vision, fatigue, shortness of breath, chest pain, abdominal pain, abnormal vaginal discharge/itching/odor/irritation, problems with periods, bowel movements, urination, or intercourse unless otherwise stated above. Pertinent History Reviewed:  Reviewed past medical,surgical, social and family history.  Reviewed problem list, medications and allergies. Physical  Assessment:   Vitals:   07/05/22 0812 07/05/22 0817  BP: (!) 163/93 (!) 155/99  Pulse: 71 73  Weight: 165 lb (74.8 kg)   Height: '5\' 4"'$  (1.626 m)   Body mass index is 28.32 kg/m.        Physical Examination:   General appearance - well appearing, and in no distress  Mental status - alert, oriented to person, place, and time  Psych:  She has a normal mood and affect  Skin - warm and dry, normal color, no suspicious lesions noted  Chest - effort normal, all lung fields clear to auscultation bilaterally  Heart - normal rate and regular rhythm  Neck:  midline trachea, no thyromegaly or nodules  Breasts - breasts appear normal, no suspicious masses, no skin or nipple changes or axillary nodes  Abdomen - soft, nontender, nondistended, no masses or organomegaly  Pelvic - VULVA: normal appearing vulva with no masses, tenderness or lesions  VAGINA: normal appearing vagina with normal color and discharge, no lesions  CERVIX: normal appearing cervix without discharge or lesions, no CMT  Thin prep pap is not done   UTERUS: uterus is felt to be normal size, shape, consistency and nontender  ADNEXA: No adnexal masses or tenderness noted.  Extremities:  No swelling or varicosities noted  Chaperone present for exam  Assessment & Plan:  1. Well woman exam with routine gynecological exam - MM 3D SCREENING MAMMOGRAM BILATERAL BREAST; Future - TestT+TestF+SHBG - TSH - Hemoglobin A1c - FSH - Estrogens, Total  2. Perimenopause Will check TSH, FSH, estrogen - TestT+TestF+SHBG - TSH - Hemoglobin A1c - FSH - Estrogens, Total  3. Hirsutism Check testosterone - TestT+TestF+SHBG - TSH - Hemoglobin A1c - FSH - Estrogens, Total  4. Uterine leiomyoma, unspecified location Check [place] to evaluate fibroid size - US PELVIC COMPLETE WITH TRANSVAGINAL; Future  5. Intrauterine contraceptive device threads lost, initial encounter Strings unable to be seen.  Check [place]. - US PELVIC COMPLETE WITH  TRANSVAGINAL; Future   Labs/procedures today:   Orders Placed This Encounter  Procedures   MM 3D SCREENING MAMMOGRAM BILATERAL BREAST   US PELVIC COMPLETE WITH TRANSVAGINAL   TestT+TestF+SHBG   TSH   Hemoglobin A1c   FSH   Estrogens, Total    Meds: No orders of the defined types were placed in this encounter.   Follow-up: No follow-ups on file.  Truett Mainland, DO 07/05/2022 8:42 AM

## 2022-07-06 LAB — CERVICOVAGINAL ANCILLARY ONLY
Bacterial Vaginitis (gardnerella): POSITIVE — AB
Candida Glabrata: NEGATIVE
Candida Vaginitis: NEGATIVE
Comment: NEGATIVE
Comment: NEGATIVE
Comment: NEGATIVE

## 2022-07-08 ENCOUNTER — Ambulatory Visit (HOSPITAL_BASED_OUTPATIENT_CLINIC_OR_DEPARTMENT_OTHER)
Admission: RE | Admit: 2022-07-08 | Discharge: 2022-07-08 | Disposition: A | Discharge status: Home / Self Care | Payer: BC Managed Care – PPO | Source: Ambulatory Visit | Attending: Family Medicine | Admitting: Family Medicine

## 2022-07-08 DIAGNOSIS — T8332XA Displacement of intrauterine contraceptive device, initial encounter: Secondary | ICD-10-CM | POA: Insufficient documentation

## 2022-07-08 DIAGNOSIS — D259 Leiomyoma of uterus, unspecified: Secondary | ICD-10-CM | POA: Diagnosis not present

## 2022-07-08 LAB — TESTT+TESTF+SHBG
Sex Hormone Binding: 31.3 nmol/L (ref 17.3–125.0)
Testosterone, Free: 0.9 pg/mL (ref 0.0–4.2)
Testosterone, Total, LC/MS: 19.3 ng/dL

## 2022-07-08 LAB — HEMOGLOBIN A1C
Est. average glucose Bld gHb Est-mCnc: 123 mg/dL
Hgb A1c MFr Bld: 5.9 % — ABNORMAL HIGH (ref 4.8–5.6)

## 2022-07-08 LAB — TSH: TSH: 1.1 u[IU]/mL (ref 0.450–4.500)

## 2022-07-08 LAB — ESTROGENS, TOTAL: Estrogen: 70 pg/mL

## 2022-07-08 LAB — FOLLICLE STIMULATING HORMONE: FSH: 41.7 m[IU]/mL

## 2022-07-17 ENCOUNTER — Telehealth: Payer: Self-pay

## 2022-07-17 MED ORDER — METRONIDAZOLE 500 MG PO TABS: 500.0000 mg | ORAL_TABLET | Freq: Two times a day (BID) | ORAL | 0 refills | Status: DC

## 2022-07-17 NOTE — Telephone Encounter (Signed)
Patient called and is anxious about the imaging results and how they revealed fibroids. Made patient aware that Dr. Nehemiah Settle doesn't manage fibroids so scheduled her with Dr. Currie Paris for follow up.   Patient also states that she had BV on a culture and has not had treatment. Verified patients pharmacy and sent in Flagyl per protocol for patient bacterial vaginosis result. Kathrene Alu RN

## 2022-07-26 ENCOUNTER — Ambulatory Visit (INDEPENDENT_AMBULATORY_CARE_PROVIDER_SITE_OTHER): Payer: BC Managed Care – PPO | Admitting: Family Medicine

## 2022-07-26 ENCOUNTER — Encounter: Payer: Self-pay | Admitting: Family Medicine

## 2022-07-26 VITALS — BP 136/84 | HR 97 | Temp 98.5°F | Ht 64.0 in | Wt 166.4 lb

## 2022-07-26 DIAGNOSIS — D250 Submucous leiomyoma of uterus: Secondary | ICD-10-CM | POA: Diagnosis not present

## 2022-07-26 DIAGNOSIS — R7303 Prediabetes: Secondary | ICD-10-CM | POA: Diagnosis not present

## 2022-07-26 DIAGNOSIS — D251 Intramural leiomyoma of uterus: Secondary | ICD-10-CM | POA: Diagnosis not present

## 2022-07-26 DIAGNOSIS — J301 Allergic rhinitis due to pollen: Secondary | ICD-10-CM | POA: Diagnosis not present

## 2022-07-26 NOTE — Progress Notes (Signed)
[facility] [address] [place] [address] Dept: [phone] Dept Fax: [phone]  Office Visit  Subjective:    Patient ID: Debra Ayers, female    DOB: 1970/04/30, 52 y.o..   MRN: [id number]  Chief Complaint  Patient presents with   Sinus Problem    C/o having watery eyes, gatigue, and head congestion x 3-4 days.  Has used Flonase, and OTC allergy meds    History of Present Illness:  Patient is in today complaining of  a 3-4 day history of watery eyes, fatigue, and nasal congestion. She has not had any fever. She is not noting any cough or sore throat. She has had some minor sneezing. She has been using Flonase nasal spray and an OTC allergy med. She notes heer symptoms are better today than last night.  Debra Ayers also notes she has been seeing her GYN. He has found her to have multiple fibroids. She is considering her surgical options at this point.   Debra Ayers also notes her GYN had found her to have prediabetes. She wants to know if this could make her eligible to be on a GLP1-RA to help with her weight loss. She has been exercising regularly and following a healthy diet, but she notes she can't seem to lose the 15 lbs. she wants to lose.   Past Medical History: Patient Active Problem List   Diagnosis Date Noted   Seasonal allergic rhinitis due to pollen 07/26/2022   Heart murmur 03/28/2022   Second degree hemorrhoids 07/11/2021   Uterine leiomyoma 08/02/2016   Stress incontinence 08/02/2016   Chronic idiopathic constipation 08/12/2014   Family history of diabetes mellitus 06/25/2013   Herpetic whitlow 02/12/2012   Essential hypertension 02/12/2012   Hearing problem 02/12/2012   Past Surgical History:  Procedure Laterality Date   AUGMENTATION MAMMAPLASTY Bilateral 07/2020   BACK SURGERY  2004   Benign fatty tumor removed from mid upper back   BREAST ENHANCEMENT SURGERY  2010   BREAST IMPLANT  REMOVAL  2013   BREAST IMPLANT REMOVAL     BREAST SURGERY  2014   Left breast biopsy, benign   LIPOSUCTION  06/2020   TUBAL LIGATION  2001   tummy tuck  [id number]   UMBILICAL HERNIA REPAIR  2018   Family History  Problem Relation Age of Onset   Arthritis Mother    Osteoporosis Mother    Diabetes Mother    Dementia Mother    Hypertension Father    Diabetes Son    Diabetes Maternal Aunt    Diabetes Maternal Uncle    Cancer Maternal Grandmother        Leukemia   Diabetes Maternal Grandmother    Breast cancer Neg Hx    Outpatient Medications Prior to Visit  Medication Sig Dispense Refill   amLODipine (NORVASC) 10 MG tablet TAKE 1 TABLET BY MOUTH DAILY 90 tablet 3   fluticasone (FLONASE) 50 MCG/ACT nasal spray Place 2 sprays into both nostrils daily. 16 g 3   hydrOXYzine (VISTARIL) 25 MG capsule Take 1 capsule (25 mg total) by mouth at bedtime as needed. 30 capsule 0   levonorgestrel (MIRENA) 20 MCG/DAY IUD 1 each by Intrauterine route once.     losartan (COZAAR) 100 MG tablet TAKE 1 TABLET BY MOUTH  DAILY 90 tablet 3   meloxicam (MOBIC) 15 MG tablet Take 1 tablet (15 mg total) by mouth daily. (Patient not taking: Reported on 07/05/2022) 30 tablet 0  methylPREDNISolone (MEDROL DOSEPAK) 4 MG TBPK tablet Take 6 tablets on day 1.  Take 5 tablets on day 2.  Take 4 tablets on day 3.  Take 3 tablets on day 4.  Take 2 tablets on day 5.  Take 1 tablet on day 6. (Patient not taking: Reported on 07/05/2022) 21 tablet 0   metroNIDAZOLE (FLAGYL) 500 MG tablet Take 1 tablet (500 mg total) by mouth 2 (two) times daily. 14 tablet 0   terbinafine (LAMISIL) 250 MG tablet Take 1 tablet (250 mg total) by mouth daily for 7 days. Repeat every 4 weeks for 6 months. (Patient not taking: Reported on 07/05/2022) 42 tablet 0   No facility-administered medications prior to visit.   No Known Allergies   Objective:   Today's Vitals   07/26/22 1550  BP: 136/84  Pulse: 97  Temp: 98.5 F (36.9 C)  TempSrc:  Temporal  SpO2: 98%  Weight: 166 lb 6.4 oz (75.5 kg)  Height: 5\' 4"  (1.626 m)   Body mass index is 28.56 kg/m.   General: Well developed, well nourished. No acute distress. HEENT: Normocephalic, non-traumatic. PERRL, EOMI. Conjunctiva clear. External ears normal. EAC   and TMs normal bilaterally. Nose with moderate congestion ,but scant rhinorrhea. Mucous membranes   moist. Oropharynx clear. Good dentition. Neck: Supple. No lymphadenopathy. No thyromegaly. Lungs: Clear to auscultation bilaterally. No wheezing, rales or rhonchi. Psych: Alert and oriented. Normal mood and affect.  Health Maintenance Due  Topic Date Due   DTaP/Tdap/Td (1 - Tdap) Never done   COLONOSCOPY (Pts 45-67yrs Insurance coverage will need to be confirmed)  Never done     Lab Results: Lab Results  Component Value Date   HGBA1C 5.9 (H) 07/05/2022    Assessment & Plan:   Problem List Items Addressed This Visit       Respiratory   Seasonal allergic rhinitis due to pollen - Primary    Recommend she continue to focus on use of her Flonase spray daily and OTC antihistamine as needed. She should avoid high pollen days or limit outdoor activities tot he morning.        Genitourinary   Uterine leiomyoma    We discussed some of her options related to fibroids. Debra Ayers prefers to avoid major surgery. She will continue to work with the gynecologist.        Other   Prediabetes    Discussed implications of prediabetes related to risk for development of diabetes. I recommended ongoign efforts at weight loss through exercise. I pointed out that health insurance generally does not cover the cost of GLP-1 Ras for patients with prediabetes.       Return if symptoms worsen or fail to improve.   Haydee Salter, MD

## 2022-07-26 NOTE — Assessment & Plan Note (Signed)
We discussed some of her options related to fibroids. Debra Ayers prefers to avoid major surgery. She will continue to work with the gynecologist.

## 2022-07-26 NOTE — Assessment & Plan Note (Signed)
Recommend she continue to focus on use of her Flonase spray daily and OTC antihistamine as needed. She should avoid high pollen days or limit outdoor activities tot he morning.

## 2022-07-26 NOTE — Assessment & Plan Note (Signed)
Discussed implications of prediabetes related to risk for development of diabetes. I recommended ongoign efforts at weight loss through exercise. I pointed out that health insurance generally does not cover the cost of GLP-1 Ras for patients with prediabetes.

## 2022-07-28 ENCOUNTER — Ambulatory Visit (INDEPENDENT_AMBULATORY_CARE_PROVIDER_SITE_OTHER): Payer: BC Managed Care – PPO | Admitting: Obstetrics and Gynecology

## 2022-07-28 ENCOUNTER — Encounter: Payer: Self-pay | Admitting: Obstetrics and Gynecology

## 2022-07-28 VITALS — BP 134/79 | HR 83 | Wt 165.0 lb

## 2022-07-28 DIAGNOSIS — M791 Myalgia, unspecified site: Secondary | ICD-10-CM | POA: Diagnosis not present

## 2022-07-28 DIAGNOSIS — D259 Leiomyoma of uterus, unspecified: Secondary | ICD-10-CM | POA: Diagnosis not present

## 2022-07-28 DIAGNOSIS — N393 Stress incontinence (female) (male): Secondary | ICD-10-CM | POA: Diagnosis not present

## 2022-07-28 NOTE — Progress Notes (Signed)
GYNECOLOGY VISIT  Patient name: Debra Ayers MRN [id number]  Date of birth: 06/20/1970 Chief Complaint:   Fibroids  History:  Debra Ayers is a 52 y.o. G2P2002 being seen today for fibroid questions. Not having currently heavy bleeding with  Having abdominal pain that has been there. Some days better than other days.  Not wanting major surgery - does not want surgery.  Chronic constipation Urinary incontinence - cough, laugh, jump "Wants a solution"       Past Medical History:  Diagnosis Date   Anemia    Heart murmur    Hypertension    medication   Pre-diabetes    Uterine fibroid     Past Surgical History:  Procedure Laterality Date   AUGMENTATION MAMMAPLASTY Bilateral 07/2020   BACK SURGERY  2004   Benign fatty tumor removed from mid upper back   BREAST ENHANCEMENT SURGERY  2010   BREAST IMPLANT REMOVAL  2013   BREAST IMPLANT REMOVAL     BREAST SURGERY  2014   Left breast biopsy, benign   LIPOSUCTION  06/2020   TUBAL LIGATION  2001   tummy tuck  06/2020   UMBILICAL HERNIA REPAIR  2018    The following portions of the patient's history were reviewed and updated as appropriate: allergies, current medications, past family history, past medical history, past social history, past surgical history and problem list.   Health Maintenance:   Last pap     Component Value Date/Time   DIAGPAP  03/10/2021 1509    - Negative for intraepithelial lesion or malignancy (NILM)   HPVHIGH Negative 03/10/2021 1509   ADEQPAP  03/10/2021 1509    Satisfactory for evaluation; transformation zone component ABSENT.   Last mammogram: BIRAD 2 05/2021   Review of Systems:  Pertinent items are noted in HPI. Comprehensive review of systems was otherwise negative.   Objective:  Physical Exam BP 134/79   Pulse 83   Wt 165 lb (74.8 kg)   BMI 28.32 kg/m    Physical Exam Vitals and nursing note reviewed. Exam conducted with a chaperone present.  Constitutional:       Appearance: Normal appearance.  HENT:     Head: Normocephalic and atraumatic.  Pulmonary:     Effort: Pulmonary effort is normal.     Breath sounds: Normal breath sounds.  Abdominal:     Palpations: Abdomen is soft.     Tenderness: There is abdominal tenderness.     Comments: Equivocal carnett Tenderness along super aspect of  Genitourinary:    General: Normal vulva.     Exam position: Lithotomy position.     Vagina: Normal.     Cervix: Normal.     Comments: Normal appearing vulva Posterior vaginal wall nontender Right levator ani 8/10 Right ischiococcygeous 8/10 Right obturator internus 8/10 Left levator ani 6/10 Left ischioccocygeous 8/10 Left obturator internus 8/10 Uterine tenderness nontender   Skin:    General: Skin is warm and dry.  Neurological:     General: No focal deficit present.     Mental Status: She is alert.  Psychiatric:        Mood and Affect: Mood normal.        Behavior: Behavior normal.        Thought Content: Thought content normal.        Judgment: Judgment normal.     Labs and Imaging US PELVIC COMPLETE WITH TRANSVAGINAL  Result Date: 07/10/2022 CLINICAL DATA:  Lost IUD string EXAM: TRANSABDOMINAL  AND TRANSVAGINAL ULTRASOUND OF PELVIS TECHNIQUE: Both transabdominal and transvaginal ultrasound examinations of the pelvis were performed. Transabdominal technique was performed for global imaging of the pelvis including uterus, ovaries, adnexal regions, and pelvic cul-de-sac. It was necessary to proceed with endovaginal exam following the transabdominal exam to visualize the endometrium and ovaries. COMPARISON:  None Available. FINDINGS: Uterus Measurements: 13.5 x 11.3 x 9.0 cm = volume: 719 mL. Contains numerous fibroids, distorting the endometrial stripe. The first fibroid is exophytic off the left side of the fundus measuring 4.0 x 2.0 x 3.1 cm Second fibroid is identified in the posterior fundus measuring 4.9 x 5.0 x 3.8 cm. The third fibroid is  identified in the anterior fundus measuring 2.8 x 2.4 x 3.9 cm. The fourth fibroid is centered in the lower uterine segment and extends to either just above the cervix or just within the cervix measuring 3.5 x 2.6 x 3.8 cm. Endometrium The endometrial stripe complex is significantly distorted by the multiple fibroids making accurate measurement difficult. My best measurement of the endometrial stripe is 6 between 10 and 14 mm. The IUD is visualized, particularly on cine images. Given distortion of the endometrium by multiple fibroids, it is difficult to assess the location of the IUD relative to the endometrium. The inferior aspect of the IUD is felt to be within the endometrial canal. However, the superior most aspect of the IUD may be subendometrial. Right ovary Measurements: 3.0 x 1.5 x 2.7 cm = volume: 6.2 mL. Normal appearance/no adnexal mass. Left ovary Not visualized. Other findings No abnormal free fluid. IMPRESSION: 1. An IUD is visualized. Evaluation of IUD location relative to the endometrium is difficult due to endometrial stripe distortion secondary to multiple fibroids. The inferior aspect of the IUD is felt to be within the endometrial canal. However, the superior most aspect of the IUD may be subendometrial. 2. The endometrial stripe is distorted by multiple fibroids. The best estimated thickness of the endometrial stripe is between 10 and 14 mm. This is within normal range in an asymptomatic premenopausal woman but would be abnormal after menopause. Recommend clinical correlation. 3. The uterus is enlarged with multiple fibroids. The largest fibroid is in the posterior fundus measuring 4.9 x 5.0 x 3.8 cm. 4. The left ovary is not visualized. Electronically Signed   By: Gerome Samavid  Williams III M.D.   On: 07/10/2022 09:57       Assessment & Plan:   1. Uterine leiomyoma, unspecified location Numerous fibroids without heavy bleeding and nontender uterus on examination. Patient currently postmenopausal  and does not desire hysterectomy.   2. Stress incontinence Reports SUI, referral to PFPT for initial treatment. If inadequate response, will refer to urogynecology.  - Ambulatory referral to Physical Therapy  3. Myalgia Discussed exam more consistent with MSK source of pain. Offered trial of muscle relaxer, declined. Recommend PFPT as well. Possible fibroid burden could contribute to current pelvic dysfunction and recommend trial of PFPT.  - Ambulatory referral to Physical Therapy    Lorriane Shirehristana Fabian Walder, MD [facility], [facility]

## 2022-08-04 NOTE — Addendum Note (Signed)
Addended by: Harlon Ditty on: 08/04/2022 01:44 PM   Modules accepted: Level of Service

## 2022-08-08 ENCOUNTER — Telehealth (HOSPITAL_BASED_OUTPATIENT_CLINIC_OR_DEPARTMENT_OTHER): Payer: Self-pay

## 2022-09-07 ENCOUNTER — Telehealth: Payer: Self-pay

## 2022-09-07 NOTE — Telephone Encounter (Signed)
Called patient to schedule 3 month follow up appointment. Left voicemail with our contact information to call back and schedule.

## 2022-09-13 ENCOUNTER — Other Ambulatory Visit: Payer: Self-pay | Admitting: Medical

## 2022-09-20 ENCOUNTER — Encounter: Payer: Self-pay | Admitting: Internal Medicine

## 2022-09-20 ENCOUNTER — Ambulatory Visit (INDEPENDENT_AMBULATORY_CARE_PROVIDER_SITE_OTHER): Payer: BC Managed Care – PPO | Admitting: Internal Medicine

## 2022-09-20 VITALS — BP 122/82 | HR 100 | Temp 98.7°F | Ht 64.0 in | Wt 157.4 lb

## 2022-09-20 DIAGNOSIS — J029 Acute pharyngitis, unspecified: Secondary | ICD-10-CM | POA: Insufficient documentation

## 2022-09-20 DIAGNOSIS — G4700 Insomnia, unspecified: Secondary | ICD-10-CM | POA: Diagnosis not present

## 2022-09-20 LAB — POCT RAPID STREP A (OFFICE): Rapid Strep A Screen: NEGATIVE

## 2022-09-20 LAB — POC COVID19 BINAXNOW: SARS Coronavirus 2 Ag: NEGATIVE

## 2022-09-20 MED ORDER — HYDROXYZINE PAMOATE 25 MG PO CAPS: 25.0000 mg | ORAL_CAPSULE | Freq: Every evening | ORAL | 0 refills | Status: DC | PRN

## 2022-09-20 MED ORDER — PENICILLIN V POTASSIUM 500 MG PO TABS: 500.0000 mg | ORAL_TABLET | Freq: Two times a day (BID) | ORAL | 0 refills | Status: DC

## 2022-09-20 NOTE — Progress Notes (Signed)
Subjective:     Patient ID: Debra Ayers, female    DOB: 1970/07/19, 52 y.o.   MRN: [id number]  Chief Complaint  Patient presents with   Sore Throat   Allergic Reaction    Started a couple days ago    HPI Patient c/o headache onset 5 days ago. Sore throat started last night.  Headache started after doing semaglutide injection which is normal for patient.   Runny nose: no  Nasal congestion: no Sneezing: no Eye swelling, itching or discharge: no Post nasal drip: no Cough: no Sinus pressure: yes  Ear pain: no  Ear pressure: no  Fever: no   Treatments attempted: Flonase for allergies  No known sick contacts, but has been around young children recently.    Patient also needs Rx for hydroxyzine sent in for sleep. She states the pharmacy did not receive last Rx.   There are no preventive care reminders to display for this patient.   Past Medical History:  Diagnosis Date   Anemia    Heart murmur    Hypertension    medication   Pre-diabetes    Uterine fibroid     Past Surgical History:  Procedure Laterality Date   AUGMENTATION MAMMAPLASTY Bilateral 07/2020   BACK SURGERY  2004   Benign fatty tumor removed from mid upper back   BREAST ENHANCEMENT SURGERY  2010   BREAST IMPLANT REMOVAL  2013   BREAST IMPLANT REMOVAL     BREAST SURGERY  2014   Left breast biopsy, benign   LIPOSUCTION  06/2020   TUBAL LIGATION  2001   tummy tuck  06/2020   UMBILICAL HERNIA REPAIR  2018    Family History  Problem Relation Age of Onset   Arthritis Mother    Osteoporosis Mother    Diabetes Mother    Dementia Mother    Hypertension Father    Diabetes Son    Diabetes Maternal Aunt    Diabetes Maternal Uncle    Cancer Maternal Grandmother        Leukemia   Diabetes Maternal Grandmother    Breast cancer Neg Hx     Social History   Socioeconomic History   Marital status: Widowed    Spouse name: Not on file   Number of children: 2   Years of education: Not on file    Highest education level: Not on file  Occupational History   Occupation: Product/process development scientist    Employer: [employer]    Comment: [employer]  Tobacco Use   Smoking status: Never   Smokeless tobacco: Never  Vaping Use   Vaping Use: Never used  Substance and Sexual Activity   Alcohol use: Yes    Alcohol/week: 3.0 standard drinks of alcohol    Types: 3 Cans of beer per week    Comment: socially   Drug use: No   Sexual activity: Yes    Birth control/protection: I.U.D.  Other Topics Concern   Not on file  Social History Narrative   Not on file   Social Determinants of Health   Financial Resource Strain: Not on file  Food Insecurity: Not on file  Transportation Needs: Not on file  Physical Activity: Not on file  Stress: Not on file  Social Connections: Not on file  Intimate Partner Violence: Not on file    Outpatient Medications Prior to Visit  Medication Sig Dispense Refill   amLODipine (NORVASC) 10 MG tablet TAKE 1 TABLET BY MOUTH DAILY 90 tablet 3  fluticasone (FLONASE) 50 MCG/ACT nasal spray Place 2 sprays into both nostrils daily. 16 g 3   levonorgestrel (MIRENA) 20 MCG/DAY IUD 1 each by Intrauterine route once.     losartan (COZAAR) 100 MG tablet TAKE 1 TABLET BY MOUTH DAILY 90 tablet 3   hydrOXYzine (VISTARIL) 25 MG capsule Take 1 capsule (25 mg total) by mouth at bedtime as needed. 30 capsule 0   No facility-administered medications prior to visit.    No Known Allergies  ROS See HPI    Objective:    Physical Exam Constitutional:      General: She is not in acute distress. HENT:     Right Ear: Tympanic membrane normal.     Left Ear: Tympanic membrane normal.     Nose: No congestion or rhinorrhea.     Right Turbinates: Not enlarged.     Left Turbinates: Not enlarged.     Mouth/Throat:     Mouth: Mucous membranes are moist.     Pharynx: Uvula midline. Posterior oropharyngeal erythema present.     Tonsils: No tonsillar exudate. 1+ on the right. 1+ on the  left.  Eyes:     Conjunctiva/sclera: Conjunctivae normal.  Cardiovascular:     Rate and Rhythm: Normal rate and regular rhythm.     Heart sounds: Normal heart sounds.  Skin:    General: Skin is warm and dry.  Neurological:     General: No focal deficit present.     Mental Status: She is alert and oriented to person, place, and time.     BP 122/82   Pulse 100   Temp 98.7 F (37.1 C) (Temporal)   Ht 5\' 4"  (1.626 m)   Wt 157 lb 6.4 oz (71.4 kg)   SpO2 98%   BMI 27.02 kg/m  Wt Readings from Last 3 Encounters:  09/20/22 157 lb 6.4 oz (71.4 kg)  07/28/22 165 lb (74.8 kg)  07/26/22 166 lb 6.4 oz (75.5 kg)    Results for orders placed or performed in visit on 09/20/22  POC COVID-19 BinaxNow  Result Value Ref Range   SARS Coronavirus 2 Ag Negative Negative  POCT rapid strep A  Result Value Ref Range   Rapid Strep A Screen Negative Negative       Assessment & Plan:   Problem List Items Addressed This Visit       Respiratory   Pharyngitis - Primary    Salt water gargles  Tylenol as needed  Watch and wait ABX provided. Patient instructed to hold ABX unless symptoms worsen or fail to improve.       Relevant Medications   penicillin v potassium (VEETID) 500 MG tablet   Other Relevant Orders   POC COVID-19 BinaxNow (Completed)   POCT rapid strep A (Completed)   Other Visit Diagnoses     Insomnia, unspecified type       Relevant Medications   hydrOXYzine (VISTARIL) 25 MG capsule       I am having Debra Ayers" start on penicillin v potassium. I am also having her maintain her fluticasone, levonorgestrel, amLODipine, losartan, and hydrOXYzine.  Return if symptoms worsen or fail to improve.  Meds ordered this encounter  Medications   hydrOXYzine (VISTARIL) 25 MG capsule    Sig: Take 1 capsule (25 mg total) by mouth at bedtime as needed.    Dispense:  30 capsule    Refill:  0    Order Specific Question:   Supervising Provider  Answer:    Garnette Gunner [[id number]]   penicillin v potassium (VEETID) 500 MG tablet    Sig: Take 1 tablet (500 mg total) by mouth 2 (two) times daily for 10 days.    Dispense:  20 tablet    Refill:  0    Order Specific Question:   Supervising Provider    Answer:   Garnette Gunner [[id number]]    Salvatore Decent, FNP

## 2022-09-20 NOTE — Assessment & Plan Note (Signed)
Salt water gargles  Tylenol as needed  Watch and wait ABX provided. Patient instructed to hold ABX unless symptoms worsen or fail to improve.

## 2022-09-20 NOTE — Patient Instructions (Signed)
HOLD taking antibiotic for the next 3 days. If no improvement in symptoms or worsening of symptoms, can start antibiotic.  Salt water gargles  Tylenol as needed for sore throat

## 2022-09-25 ENCOUNTER — Ambulatory Visit (INDEPENDENT_AMBULATORY_CARE_PROVIDER_SITE_OTHER): Payer: BC Managed Care – PPO | Admitting: Family Medicine

## 2022-09-25 ENCOUNTER — Encounter: Payer: Self-pay | Admitting: Family Medicine

## 2022-09-25 VITALS — BP 146/82 | HR 94 | Temp 98.0°F | Ht 64.0 in | Wt 159.2 lb

## 2022-09-25 DIAGNOSIS — H6992 Unspecified Eustachian tube disorder, left ear: Secondary | ICD-10-CM | POA: Diagnosis not present

## 2022-09-25 DIAGNOSIS — J4520 Mild intermittent asthma, uncomplicated: Secondary | ICD-10-CM | POA: Diagnosis not present

## 2022-09-25 DIAGNOSIS — J0100 Acute maxillary sinusitis, unspecified: Secondary | ICD-10-CM | POA: Diagnosis not present

## 2022-09-25 MED ORDER — AMOXICILLIN 875 MG PO TABS: 875.0000 mg | ORAL_TABLET | Freq: Two times a day (BID) | ORAL | 0 refills | Status: AC

## 2022-09-25 MED ORDER — PREDNISONE 10 MG PO TABS: 10.0000 mg | ORAL_TABLET | Freq: Two times a day (BID) | ORAL | 0 refills | Status: AC

## 2022-09-25 MED ORDER — BENZONATATE 200 MG PO CAPS: 200.0000 mg | ORAL_CAPSULE | Freq: Two times a day (BID) | ORAL | 0 refills | Status: DC | PRN

## 2022-09-25 NOTE — Progress Notes (Signed)
Established Patient Office Visit   Subjective:  Patient ID: Debra Ayers, female    DOB: 17-Oct-1970  Age: 52 y.o. MRN: [id number]  Chief Complaint  Patient presents with   Cough    Cough, scratchy throat very hoarse voice x 2 days patient seen in last week not much improvement.    Cough Associated symptoms include a sore throat. Pertinent negatives include no chills, ear pain, eye redness, fever, myalgias, rash, shortness of breath or wheezing.   Encounter Diagnoses  Name Primary?   Acute non-recurrent maxillary sinusitis Yes   Dysfunction of left eustachian tube    Mild intermittent reactive airway disease without complication    9-day history of URI signs and symptoms that had seemed to be improving but recently took a turn for the worse.  She reports ongoing postnasal drainage and sore throat with swallowing.  Recently developed laryngitis.  Recently developed left ear congestion.  There is tightness in her chest.  Cough is productive of scant phlegm.  There is scant rhinorrhea.  She has a history of asthma as a child.  She does not smoke.  She denies facial pressure or teeth pain.  She was given a prescription for penicillin for watch and wait and has not started it.   Review of Systems  Constitutional: Negative.  Negative for chills and fever.  HENT:  Positive for congestion and sore throat. Negative for ear pain and sinus pain.   Eyes:  Negative for blurred vision, discharge and redness.  Respiratory:  Positive for cough. Negative for sputum production, shortness of breath and wheezing.   Cardiovascular: Negative.   Gastrointestinal:  Negative for abdominal pain.  Genitourinary: Negative.   Musculoskeletal: Negative.  Negative for myalgias.  Skin:  Negative for rash.  Neurological:  Negative for tingling, loss of consciousness and weakness.  Endo/Heme/Allergies:  Negative for polydipsia.     Current Outpatient Medications:    amLODipine (NORVASC) 10 MG tablet, TAKE  1 TABLET BY MOUTH DAILY, Disp: 90 tablet, Rfl: 3   amoxicillin (AMOXIL) 875 MG tablet, Take 1 tablet (875 mg total) by mouth 2 (two) times daily for 10 days., Disp: 20 tablet, Rfl: 0   benzonatate (TESSALON) 200 MG capsule, Take 1 capsule (200 mg total) by mouth 2 (two) times daily as needed for cough., Disp: 20 capsule, Rfl: 0   fluticasone (FLONASE) 50 MCG/ACT nasal spray, Place 2 sprays into both nostrils daily., Disp: 16 g, Rfl: 3   levonorgestrel (MIRENA) 20 MCG/DAY IUD, 1 each by Intrauterine route once., Disp: , Rfl:    losartan (COZAAR) 100 MG tablet, TAKE 1 TABLET BY MOUTH DAILY, Disp: 90 tablet, Rfl: 3   predniSONE (DELTASONE) 10 MG tablet, Take 1 tablet (10 mg total) by mouth 2 (two) times daily with a meal for 5 days., Disp: 10 tablet, Rfl: 0   hydrOXYzine (VISTARIL) 25 MG capsule, Take 1 capsule (25 mg total) by mouth at bedtime as needed. (Patient not taking: Reported on 09/25/2022), Disp: 30 capsule, Rfl: 0   Objective:     BP (!) 146/82 (BP Location: Right Arm, Patient Position: Sitting, Cuff Size: Large)   Pulse 94   Temp 98 F (36.7 C) (Temporal)   Ht 5\' 4"  (1.626 m)   Wt 159 lb 3.2 oz (72.2 kg)   SpO2 99%   BMI 27.33 kg/m    Physical Exam Constitutional:      General: She is not in acute distress.    Appearance: Normal appearance. She is  not ill-appearing, toxic-appearing or diaphoretic.  HENT:     Head: Normocephalic and atraumatic.      Right Ear: External ear normal. Tympanic membrane is not erythematous.     Left Ear: External ear normal. Tympanic membrane is retracted. Tympanic membrane is not erythematous.     Mouth/Throat:     Mouth: Mucous membranes are moist.     Pharynx: Oropharynx is clear. No oropharyngeal exudate or posterior oropharyngeal erythema.  Eyes:     General: No scleral icterus.       Right eye: No discharge.        Left eye: No discharge.     Extraocular Movements: Extraocular movements intact.     Conjunctiva/sclera: Conjunctivae  normal.     Pupils: Pupils are equal, round, and reactive to light.  Cardiovascular:     Rate and Rhythm: Normal rate and regular rhythm.  Pulmonary:     Effort: Pulmonary effort is normal. No respiratory distress.     Breath sounds: Normal breath sounds. No wheezing or rales.  Abdominal:     General: Bowel sounds are normal.  Musculoskeletal:     Cervical back: No rigidity or tenderness.  Lymphadenopathy:     Cervical: No cervical adenopathy.  Skin:    General: Skin is warm and dry.  Neurological:     Mental Status: She is alert and oriented to person, place, and time.  Psychiatric:        Mood and Affect: Mood normal.        Behavior: Behavior normal.      No results found for any visits on 09/25/22.    The 10-year ASCVD risk score (Arnett DK, et al., 2019) is: 1.7%    Assessment & Plan:   Acute non-recurrent maxillary sinusitis -     Amoxicillin; Take 1 tablet (875 mg total) by mouth 2 (two) times daily for 10 days.  Dispense: 20 tablet; Refill: 0  Dysfunction of left eustachian tube -     predniSONE; Take 1 tablet (10 mg total) by mouth 2 (two) times daily with a meal for 5 days.  Dispense: 10 tablet; Refill: 0  Mild intermittent reactive airway disease without complication -     predniSONE; Take 1 tablet (10 mg total) by mouth 2 (two) times daily with a meal for 5 days.  Dispense: 10 tablet; Refill: 0 -     Benzonatate; Take 1 capsule (200 mg total) by mouth 2 (two) times daily as needed for cough.  Dispense: 20 capsule; Refill: 0    Return if symptoms worsen or fail to improve.   Start amoxicillin in lieu of penicillin.  Tessalon for cough.  Short course of prednisone for ETD and RAD. Mliss Sax, MD

## 2023-02-20 ENCOUNTER — Encounter: Payer: Self-pay | Admitting: Family Medicine

## 2023-02-20 ENCOUNTER — Ambulatory Visit (INDEPENDENT_AMBULATORY_CARE_PROVIDER_SITE_OTHER): Payer: BC Managed Care – PPO | Admitting: Family Medicine

## 2023-02-20 VITALS — BP 144/90 | HR 70 | Temp 97.6°F | Ht 64.0 in | Wt 150.8 lb

## 2023-02-20 DIAGNOSIS — J069 Acute upper respiratory infection, unspecified: Secondary | ICD-10-CM | POA: Insufficient documentation

## 2023-02-20 DIAGNOSIS — G4700 Insomnia, unspecified: Secondary | ICD-10-CM | POA: Diagnosis not present

## 2023-02-20 MED ORDER — HYDROXYZINE PAMOATE 25 MG PO CAPS: 25.0000 mg | ORAL_CAPSULE | Freq: Every evening | ORAL | 0 refills | Status: DC | PRN

## 2023-02-20 NOTE — Assessment & Plan Note (Signed)
Discussed home care for viral illness, including rest, pushing fluids, and OTC medications as needed for symptom relief. Recommend Afrin spray for congestion. Recommend hot tea with honey for sore throat symptoms. Follow-up if needed for worsening or persistent symptoms.

## 2023-02-20 NOTE — Assessment & Plan Note (Signed)
I recommend she consider pairing her hydroxyzine with a Tylenol PM to see if this reduces her leg discomfort and improves sleep. Otherwise, she could try taking 50 mg of hydroxyzine at bedtime.

## 2023-02-20 NOTE — Progress Notes (Signed)
[facility] [address] [place] [address] Dept: [phone] Dept Fax: [phone]  Office Visit  Subjective:    Patient ID: Debra Ayers, female    DOB: 1971-04-06, 52 y.o..   MRN: [id number]  Chief Complaint  Patient presents with   Sinus Problem    C/o having sinus/chest congestion, cough and sinus drainage x 9 days.  Has taken Mucinex and Flonase.    History of Present Illness:  Patient is in today with an 8-day history of upper respiratory symptoms. She notes her initial symptoms involved chills, body aches, and sore throat. These symptoms have calmed down, but she is now experiencing mostly nasal congestion, post-nasal mucous, and cough. She had some hoarseness 2 days ago, which has now resolved. She is coughing, esp. at night. Her symptoms tend to be improved in the morning. She has been using Mucinex, Flonase, and Advil.  Debra Ayers uses hydroxyzine for sleep, but finds this is not always helpful. She complains of some achiness in her right thigh area. She feels like this is "bone" pain vs. the muscle. She equates these feelings with her menopause.,  Past Medical History: Patient Active Problem List   Diagnosis Date Noted   Insomnia 02/20/2023   Viral URI with cough 02/20/2023   Pharyngitis 09/20/2022   Seasonal allergic rhinitis due to pollen 07/26/2022   Prediabetes 07/26/2022   Heart murmur 03/28/2022   Second degree hemorrhoids 07/11/2021   Uterine leiomyoma 08/02/2016   Stress incontinence 08/02/2016   Chronic idiopathic constipation 08/12/2014   Herpetic whitlow 02/12/2012   Essential hypertension 02/12/2012   Past Surgical History:  Procedure Laterality Date   AUGMENTATION MAMMAPLASTY Bilateral 07/2020   BACK SURGERY  2004   Benign fatty tumor removed from mid upper back   BREAST ENHANCEMENT SURGERY  2010   BREAST IMPLANT REMOVAL  2013   BREAST IMPLANT REMOVAL     BREAST SURGERY  2014    Left breast biopsy, benign   LIPOSUCTION  06/2020   TUBAL LIGATION  2001   tummy tuck  06/2020   UMBILICAL HERNIA REPAIR  2018   Family History  Problem Relation Age of Onset   Arthritis Mother    Osteoporosis Mother    Diabetes Mother    Dementia Mother    Hypertension Father    Diabetes Son    Diabetes Maternal Aunt    Diabetes Maternal Uncle    Cancer Maternal Grandmother        Leukemia   Diabetes Maternal Grandmother    Breast cancer Neg Hx    Outpatient Medications Prior to Visit  Medication Sig Dispense Refill   amLODipine (NORVASC) 10 MG tablet TAKE 1 TABLET BY MOUTH DAILY 90 tablet 3   fluticasone (FLONASE) 50 MCG/ACT nasal spray Place 2 sprays into both nostrils daily. 16 g 3   levonorgestrel (MIRENA) 20 MCG/DAY IUD 1 each by Intrauterine route once.     losartan (COZAAR) 100 MG tablet TAKE 1 TABLET BY MOUTH DAILY 90 tablet 3   benzonatate (TESSALON) 200 MG capsule Take 1 capsule (200 mg total) by mouth 2 (two) times daily as needed for cough. 20 capsule 0   hydrOXYzine (VISTARIL) 25 MG capsule Take 1 capsule (25 mg total) by mouth at bedtime as needed. (Patient not taking: Reported on 09/25/2022) 30 capsule 0   No facility-administered medications prior to visit.   No Known Allergies   Objective:   Today's Vitals   02/20/23 5284  BP: (!) 144/90  Pulse: 70  Temp: 97.6 F (36.4 C)  TempSrc: Temporal  Weight: 150 lb 12.8 oz (68.4 kg)  Height: 5\' 4"  (1.626 m)   Body mass index is 25.88 kg/m.   General: Well developed, well nourished. No acute distress. HEENT: Normocephalic, non-traumatic. Conjunctiva clear. External ears normal. EAC and TMs normal bilaterally.   Nose  with moderate congestion and rhinorrhea. Mucous membranes moist. Mucous streaking of posterior   oropharynx. Good dentition. Neck: Supple. No lymphadenopathy. No thyromegaly. Lungs: Clear to auscultation bilaterally. No wheezing, rales or rhonchi. Extremities: No pain on palpation over right  hip. No swelling of right leg.  Psych: Alert and oriented. Normal mood and affect.  There are no preventive care reminders to display for this patient.    Assessment & Plan:   Problem List Items Addressed This Visit       Respiratory   Viral URI with cough - Primary    Discussed home care for viral illness, including rest, pushing fluids, and OTC medications as needed for symptom relief. Recommend Afrin spray for congestion. Recommend hot tea with honey for sore throat symptoms. Follow-up if needed for worsening or persistent symptoms.         Other   Insomnia    I recommend she consider pairing her hydroxyzine with a Tylenol PM to see if this reduces her leg discomfort and improves sleep. Otherwise, she could try taking 50 mg of hydroxyzine at bedtime.      Relevant Medications   hydrOXYzine (VISTARIL) 25 MG capsule    Return in about 4 weeks (around 03/20/2023) for Reassessment of blood pressure.   Loyola Mast, MD

## 2023-03-02 ENCOUNTER — Other Ambulatory Visit: Payer: Self-pay | Admitting: Medical

## 2023-04-19 ENCOUNTER — Encounter: Payer: Self-pay | Admitting: Internal Medicine

## 2023-04-19 ENCOUNTER — Ambulatory Visit (INDEPENDENT_AMBULATORY_CARE_PROVIDER_SITE_OTHER): Payer: BC Managed Care – PPO | Admitting: Internal Medicine

## 2023-04-19 VITALS — BP 132/86 | HR 91 | Temp 100.5°F | Ht 64.0 in | Wt 143.4 lb

## 2023-04-19 DIAGNOSIS — G4700 Insomnia, unspecified: Secondary | ICD-10-CM

## 2023-04-19 DIAGNOSIS — J101 Influenza due to other identified influenza virus with other respiratory manifestations: Secondary | ICD-10-CM

## 2023-04-19 LAB — POC COVID19 BINAXNOW: SARS Coronavirus 2 Ag: NEGATIVE

## 2023-04-19 LAB — POCT RAPID STREP A (OFFICE): Rapid Strep A Screen: NEGATIVE

## 2023-04-19 LAB — POCT INFLUENZA A/B
Influenza A, POC: POSITIVE — AB
Influenza B, POC: NEGATIVE

## 2023-04-19 MED ORDER — HYDROXYZINE PAMOATE 25 MG PO CAPS: 25.0000 mg | ORAL_CAPSULE | Freq: Every evening | ORAL | 0 refills | Status: DC | PRN

## 2023-04-19 NOTE — Patient Instructions (Addendum)
Rest, drink plenty of fluids.  Tylenol for fever, body aches.   For cough: Take Mucinex DM or Robitussin-DM over-the-counter.  Follow the instructions in the box.  For nasal and sinus congestion: Use over-the-counter Flonase: 2 nasal sprays on each side of the nose in the morning until you feel better  Use over-the-counter Astepro 2 nasal sprays on each side of the nose twice daily until better  Patients with high blood pressure can use Coricidin HBP for decongestant, it will not raise your blood pressure.   If you have been prescribed an antibiotic, take as prescribed.  Call if not gradually better over the next  10 days  Call anytime if the symptoms are severe, you have high fever, short of breath, chest pain

## 2023-04-19 NOTE — Progress Notes (Signed)
[facility] [address] [place] [address] Dept: [phone] Dept Fax: [phone]  Acute Care Office Visit  Subjective:   Debra Ayers [birth year] 04/19/2023  Chief Complaint  Patient presents with   Cough   Fever    100.5   Nasal Congestion    Started on Saturday     HPI: Discussed the use of AI scribe software for clinical note transcription with the patient, who gave verbal consent to proceed.  History of Present Illness   The patient presents with a week-long history of flu-like symptoms, initially starting with sinus congestion and a general feeling of malaise. She reports feeling 'drained and tired,' with decreased appetite and fluid intake. She also describes a sensation of 'gunk' in her chest, with a non-productive cough. She denies shortness of breath but reports feeling slightly nauseous and dizzy, which she attributes to poor dietary intake. She also reports leg pain, described as muscle aches, particularly in the lower extremities.  In addition to these symptoms, she has been experiencing a sensation of fullness and pressure in the head, which she believes may be due to a sinus infection, a condition she has experienced in the past. She also reports a recent earache in the right ear. She has taken a home COVID test, which returned negative, but expresses doubts about its accuracy.  In an attempt to manage her symptoms, she has taken an old Rx of prednisone for four days and tessalon perles, neither of which have provided significant relief.   She needs refill of Vistaril, which she takes as needed for sleep.         The following portions of the patient's history were reviewed and updated as appropriate: past medical history, past surgical history, family history, social history, allergies, medications, and problem list.   Patient Active Problem List   Diagnosis Date Noted   Insomnia 02/20/2023    Viral URI with cough 02/20/2023   Pharyngitis 09/20/2022   Seasonal allergic rhinitis due to pollen 07/26/2022   Prediabetes 07/26/2022   Heart murmur 03/28/2022   Second degree hemorrhoids 07/11/2021   Uterine leiomyoma 08/02/2016   Stress incontinence 08/02/2016   Chronic idiopathic constipation 08/12/2014   Herpetic whitlow 02/12/2012   Essential hypertension 02/12/2012   Past Medical History:  Diagnosis Date   Anemia    Heart murmur    Hypertension    medication   Pre-diabetes    Uterine fibroid    Past Surgical History:  Procedure Laterality Date   AUGMENTATION MAMMAPLASTY Bilateral 07/2020   BACK SURGERY  2004   Benign fatty tumor removed from mid upper back   BREAST ENHANCEMENT SURGERY  2010   BREAST IMPLANT REMOVAL  2013   BREAST IMPLANT REMOVAL     BREAST SURGERY  2014   Left breast biopsy, benign   LIPOSUCTION  06/2020   TUBAL LIGATION  2001   tummy tuck  06/2020   UMBILICAL HERNIA REPAIR  2018   Family History  Problem Relation Age of Onset   Arthritis Mother    Osteoporosis Mother    Diabetes Mother    Dementia Mother    Hypertension Father    Diabetes Son    Diabetes Maternal Aunt    Diabetes Maternal Uncle    Cancer Maternal Grandmother        Leukemia   Diabetes Maternal Grandmother    Breast cancer Neg Hx     Current Outpatient Medications:    amLODipine (  NORVASC) 10 MG tablet, TAKE 1 TABLET BY MOUTH DAILY, Disp: 90 tablet, Rfl: 3   fluticasone (FLONASE) 50 MCG/ACT nasal spray, Place 2 sprays into both nostrils daily., Disp: 16 g, Rfl: 3   levonorgestrel (MIRENA) 20 MCG/DAY IUD, 1 each by Intrauterine route once., Disp: , Rfl:    losartan (COZAAR) 100 MG tablet, TAKE 1 TABLET BY MOUTH DAILY, Disp: 90 tablet, Rfl: 3   hydrOXYzine (VISTARIL) 25 MG capsule, Take 1 capsule (25 mg total) by mouth at bedtime as needed., Disp: 30 capsule, Rfl: 0 No Known Allergies   ROS: A complete ROS was performed with pertinent positives/negatives noted in  the HPI. The remainder of the ROS are negative.    Objective:   Today's Vitals   04/19/23 1424  BP: 132/86  Pulse: 91  Temp: (!) 100.5 F (38.1 C)  TempSrc: Temporal  SpO2: 96%  Weight: 143 lb 6.4 oz (65 kg)  Height: 5\' 4"  (1.626 m)    GENERAL: ill-appearing, in NAD. Well nourished.  SKIN: Pink, warm and dry. No rash.  HEENT:    HEAD: Normocephalic, non-traumatic.  EYES: Conjunctive pink without exudate. PERRL.  EARS: External ear w/o redness, swelling, masses, or lesions. EAC clear. TM's intact, translucent w/o bulging, appropriate landmarks visualized.  NOSE: Septum midline w/o deformity. Nares patent, mucosa pink and inflamed with drainage. (+) sinus tenderness.  THROAT: Uvula midline. Oropharynx clear. Tonsils non-inflamed w/o exudate . Mucus membranes pink and moist.  NECK: Trachea midline. Full ROM w/o pain or tenderness. Anterior cervical chain and submandibular lymphadenopathy RESPIRATORY: Chest wall symmetrical. Respirations even and non-labored. Breath sounds clear to auscultation bilaterally.  CARDIAC: S1, S2 present, regular rate and rhythm. Peripheral pulses 2+ bilaterally.  EXTREMITIES: Without clubbing, cyanosis, or edema.  NEUROLOGIC: Steady, even gait.  PSYCH/MENTAL STATUS: Alert, oriented x 3. Cooperative, appropriate mood and affect.    Results for orders placed or performed in visit on 04/19/23  POCT Influenza A/B  Result Value Ref Range   Influenza A, POC Positive (A) Negative   Influenza B, POC Negative Negative  POCT rapid strep A  Result Value Ref Range   Rapid Strep A Screen Negative Negative  POC COVID-19 BinaxNow  Result Value Ref Range   SARS Coronavirus 2 Ag Negative Negative      Assessment & Plan:  Assessment and Plan    Influenza A -Supportive care:rest, hydration, Tylenol as needed for fever or body aches. -Mucinex DM or Robitussin DM for cough. -Flonase nasal spray or Nedipot for sinus congestion. -Coricidin HBP for sinus  congestion. -Follow up if symptoms worsen or fail to improve.  Insomnia Request for Vistaril refill for sleep. -Refill Vistaril prescription as requested.      Meds ordered this encounter  Medications   hydrOXYzine (VISTARIL) 25 MG capsule    Sig: Take 1 capsule (25 mg total) by mouth at bedtime as needed.    Dispense:  30 capsule    Refill:  0    Supervising Provider:   Garnette Gunner [[id number]]   Orders Placed This Encounter  Procedures   POCT Influenza A/B   POCT rapid strep A   POC COVID-19 BinaxNow    Previously tested for COVID-19:   No    Resident in a congregate (group) care setting:   No    Employed in healthcare setting:   Unknown    Pregnant:   No   Lab Orders         POCT Influenza A/B  POCT rapid strep A         POC COVID-19 BinaxNow     No images are attached to the encounter or orders placed in the encounter.  Return if symptoms worsen or fail to improve.   Salvatore Decent, FNP

## 2023-07-31 ENCOUNTER — Other Ambulatory Visit: Payer: Self-pay | Admitting: Medical

## 2023-08-27 ENCOUNTER — Ambulatory Visit (INDEPENDENT_AMBULATORY_CARE_PROVIDER_SITE_OTHER): Admitting: Nurse Practitioner

## 2023-08-27 ENCOUNTER — Encounter: Payer: Self-pay | Admitting: Nurse Practitioner

## 2023-08-27 VITALS — BP 144/102 | HR 97 | Temp 99.6°F | Ht 64.0 in | Wt 146.6 lb

## 2023-08-27 DIAGNOSIS — J069 Acute upper respiratory infection, unspecified: Secondary | ICD-10-CM | POA: Diagnosis not present

## 2023-08-27 LAB — POC COVID19 BINAXNOW: SARS Coronavirus 2 Ag: NEGATIVE

## 2023-08-27 LAB — POCT RAPID STREP A (OFFICE): Rapid Strep A Screen: NEGATIVE

## 2023-08-27 MED ORDER — CETIRIZINE HCL 10 MG PO TABS: 10.0000 mg | ORAL_TABLET | Freq: Every day | ORAL | 0 refills | Status: DC

## 2023-08-27 MED ORDER — PROMETHAZINE-DM 6.25-15 MG/5ML PO SYRP: 5.0000 mL | ORAL_SOLUTION | Freq: Four times a day (QID) | ORAL | 0 refills | Status: DC | PRN

## 2023-08-27 NOTE — Progress Notes (Signed)
 Acute Office Visit  Subjective:     Patient ID: ALYSSHA GUYETT, female    DOB: 04-01-1971, 53 y.o.   MRN: [id number]  Chief Complaint  Patient presents with   Cough    Chest congestion, Runny nose, Headache    HPI Discussed the use of AI scribe software for clinical note transcription with the patient, who gave verbal consent to proceed.  History of Present Illness   JANNETTE ZACCHEO "Terie Fermo" is a 53 year old female with hypertension who presents with upper respiratory symptoms and sore throat.  Symptoms began on Friday with eye aches, nasal congestion, and headaches described as a sensation of 'a balloon in my head'. The sore throat worsened over the weekend, causing difficulty swallowing. A persistent cough disrupts her sleep, with a sensation of chest congestion but no productive cough, although she can taste something when coughing. She experiences a light pressure headache and right ear pain, with tenderness on the left side of her face.  She has taken hydroxyzine  for sleep and cetirizine for allergies, with two doses on Friday night and Saturday morning. On Sunday, she took Sudafed and aspirin for severe throat pain, despite its potential impact on her blood pressure, which is elevated today. She took four Sudafed pills on Sunday, two in the morning and two at night, but has not taken any more since then.  No fever is present, but she experiences chills, which she attributes to her air conditioning. She has not been around anyone who is sick recently. She inquires about continuing Afrin for nasal congestion, which she has been using every ten hours as directed.      ROS See pertinent positives and negatives per HPI.     Objective:    BP (!) 144/102 (BP Location: Left Arm, Cuff Size: Normal)   Pulse 97   Temp 99.6 F (37.6 C) (Oral)   Ht 5\' 4"  (1.626 m)   Wt 146 lb 9.6 oz (66.5 kg)   SpO2 97%   BMI 25.16 kg/m    Physical Exam Vitals and nursing note reviewed.   Constitutional:      General: She is not in acute distress.    Appearance: Normal appearance.  HENT:     Head: Normocephalic.     Right Ear: Tympanic membrane, ear canal and external ear normal.     Left Ear: Tympanic membrane, ear canal and external ear normal.     Nose: Nose normal.     Mouth/Throat:     Mouth: Mucous membranes are moist.     Pharynx: Posterior oropharyngeal erythema present. No oropharyngeal exudate.  Eyes:     Conjunctiva/sclera: Conjunctivae normal.  Cardiovascular:     Rate and Rhythm: Normal rate and regular rhythm.     Pulses: Normal pulses.     Heart sounds: Normal heart sounds.  Pulmonary:     Effort: Pulmonary effort is normal.     Breath sounds: Normal breath sounds.  Musculoskeletal:     Cervical back: Normal range of motion and neck supple. No tenderness.  Lymphadenopathy:     Cervical: No cervical adenopathy.  Skin:    General: Skin is warm.  Neurological:     General: No focal deficit present.     Mental Status: She is alert and oriented to person, place, and time.  Psychiatric:        Mood and Affect: Mood normal.        Behavior: Behavior normal.  Thought Content: Thought content normal.        Judgment: Judgment normal.     Results for orders placed or performed in visit on 08/27/23  POC COVID-19 BinaxNow  Result Value Ref Range   SARS Coronavirus 2 Ag Negative Negative  POCT rapid strep A  Result Value Ref Range   Rapid Strep A Screen Negative Negative        Assessment & Plan:   Problem List Items Addressed This Visit   None Visit Diagnoses       Upper respiratory tract infection, unspecified type    -  Primary   covid and strep negative. Most likely viral vs allergies. Continue zyrtec 10mg  daily. Encourage fluids, rest. Start promethazine dm q4hr prn cough.   Relevant Orders   POC COVID-19 BinaxNow (Completed)   POCT rapid strep A (Completed)       Meds ordered this encounter  Medications    promethazine-dextromethorphan (PROMETHAZINE-DM) 6.25-15 MG/5ML syrup    Sig: Take 5 mLs by mouth 4 (four) times daily as needed.    Dispense:  118 mL    Refill:  0   cetirizine (ZYRTEC) 10 MG tablet    Sig: Take 1 tablet (10 mg total) by mouth daily.    Dispense:  30 tablet    Refill:  0    Return if symptoms worsen or fail to improve.  Odette Benjamin, NP

## 2023-08-27 NOTE — Patient Instructions (Signed)
 It was great to see you!  Keep taking the cetirizine daily   Drink plenty of fluids and get rest   Start cough syrup every 4 hours as needed   You can use afrin as needed for 3 days   Let's follow-up if your symptoms worsen or don't improve.   Take care,  Rheba Cedar, NP

## 2023-09-05 ENCOUNTER — Telehealth: Payer: Self-pay | Admitting: Family Medicine

## 2023-09-05 MED ORDER — PROMETHAZINE-DM 6.25-15 MG/5ML PO SYRP: 5.0000 mL | ORAL_SOLUTION | Freq: Four times a day (QID) | ORAL | 0 refills | Status: DC | PRN

## 2023-09-05 NOTE — Telephone Encounter (Signed)
 Copied from CRM [id number]. Topic: Clinical - Medication Question >> Sep 03, 2023  8:15 AM Debra Ayers wrote: Reason for CRM: Patient stated she is still coughing and still feeling congested would like to know if another prescription of promethazine -dextromethorphan (PROMETHAZINE -DM) 6.25-15 MG/5ML syrup can be sent to the pharmacy

## 2023-09-30 ENCOUNTER — Emergency Department (HOSPITAL_BASED_OUTPATIENT_CLINIC_OR_DEPARTMENT_OTHER)
Admission: EM | Admit: 2023-09-30 | Discharge: 2023-09-30 | Disposition: A | Discharge status: Home / Self Care | Attending: Emergency Medicine | Admitting: Emergency Medicine

## 2023-09-30 ENCOUNTER — Encounter (HOSPITAL_BASED_OUTPATIENT_CLINIC_OR_DEPARTMENT_OTHER): Payer: Self-pay | Admitting: Urology

## 2023-09-30 ENCOUNTER — Emergency Department (HOSPITAL_BASED_OUTPATIENT_CLINIC_OR_DEPARTMENT_OTHER)

## 2023-09-30 DIAGNOSIS — R519 Headache, unspecified: Secondary | ICD-10-CM | POA: Diagnosis not present

## 2023-09-30 DIAGNOSIS — Z79899 Other long term (current) drug therapy: Secondary | ICD-10-CM | POA: Insufficient documentation

## 2023-09-30 DIAGNOSIS — I10 Essential (primary) hypertension: Secondary | ICD-10-CM | POA: Diagnosis not present

## 2023-09-30 DIAGNOSIS — R29818 Other symptoms and signs involving the nervous system: Secondary | ICD-10-CM | POA: Diagnosis not present

## 2023-09-30 LAB — CBC WITH DIFFERENTIAL/PLATELET
Abs Immature Granulocytes: 0.01 10*3/uL (ref 0.00–0.07)
Basophils Absolute: 0.1 10*3/uL (ref 0.0–0.1)
Basophils Relative: 1 %
Eosinophils Absolute: 0.2 10*3/uL (ref 0.0–0.5)
Eosinophils Relative: 2 %
HCT: 42.5 % (ref 36.0–46.0)
Hemoglobin: 13.8 g/dL (ref 12.0–15.0)
Immature Granulocytes: 0 %
Lymphocytes Relative: 24 %
Lymphs Abs: 2 10*3/uL (ref 0.7–4.0)
MCH: 28.1 pg (ref 26.0–34.0)
MCHC: 32.5 g/dL (ref 30.0–36.0)
MCV: 86.6 fL (ref 80.0–100.0)
Monocytes Absolute: 0.6 10*3/uL (ref 0.1–1.0)
Monocytes Relative: 7 %
Neutro Abs: 5.7 10*3/uL (ref 1.7–7.7)
Neutrophils Relative %: 66 %
Platelets: 281 10*3/uL (ref 150–400)
RBC: 4.91 MIL/uL (ref 3.87–5.11)
RDW: 13.6 % (ref 11.5–15.5)
WBC: 8.5 10*3/uL (ref 4.0–10.5)
nRBC: 0 % (ref 0.0–0.2)

## 2023-09-30 LAB — COMPREHENSIVE METABOLIC PANEL WITH GFR
ALT: 24 U/L (ref 0–44)
AST: 19 U/L (ref 15–41)
Albumin: 4.7 g/dL (ref 3.5–5.0)
Alkaline Phosphatase: 104 U/L (ref 38–126)
Anion gap: 13 (ref 5–15)
BUN: 14 mg/dL (ref 6–20)
CO2: 23 mmol/L (ref 22–32)
Calcium: 9.7 mg/dL (ref 8.9–10.3)
Chloride: 108 mmol/L (ref 98–111)
Creatinine, Ser: 0.79 mg/dL (ref 0.44–1.00)
GFR, Estimated: >60 mL/min (ref >60)
Glucose, Bld: 110 mg/dL — ABNORMAL HIGH (ref 70–99)
Potassium: 3.8 mmol/L (ref 3.5–5.1)
Sodium: 143 mmol/L (ref 135–145)
Total Bilirubin: 0.4 mg/dL (ref 0.0–1.2)
Total Protein: 7.6 g/dL (ref 6.5–8.1)

## 2023-09-30 MED ORDER — DIPHENHYDRAMINE HCL 50 MG/ML IJ SOLN
25.0000 mg | Freq: Once | INTRAMUSCULAR | Status: AC
  Administered 2023-09-30: 25 mg via INTRAVENOUS
  Filled 2023-09-30: qty 1

## 2023-09-30 MED ORDER — METOCLOPRAMIDE HCL 5 MG/ML IJ SOLN
10.0000 mg | Freq: Once | INTRAMUSCULAR | Status: AC
  Administered 2023-09-30: 10 mg via INTRAVENOUS
  Filled 2023-09-30: qty 2

## 2023-09-30 MED ORDER — ACETAMINOPHEN 500 MG PO TABS
1000.0000 mg | ORAL_TABLET | Freq: Once | ORAL | Status: AC
  Administered 2023-09-30: 1000 mg via ORAL
  Filled 2023-09-30: qty 2

## 2023-09-30 NOTE — Discharge Instructions (Addendum)
 CT scan of your head appeared normal.  Labs also appeared normal.  Recommend continue taking your blood pressure at home.  You may take Tylenol  for any headache recommend follow-up with primary care for reassessment.  Please not hesitate to return to emergency department if the worrisome signs and symptoms were discussed regarding care.

## 2023-09-30 NOTE — ED Notes (Signed)
 Patient transported to CT

## 2023-09-30 NOTE — ED Triage Notes (Signed)
 Pt states BP high since yesterday after getting headache 190/121 at home yesterday  148/107 today at home  Took BP meds today   Stopped BP meds x 2 weeks and started taking it again yesterday when noticed BP high

## 2023-09-30 NOTE — ED Provider Notes (Signed)
 [place] [facility] Provider Note   CSN: [id number] Arrival date & time: 09/30/23  1133     History  Chief Complaint  Patient presents with   Hypertension    Debra Ayers is a 53 y.o. female.   Hypertension   53 year old female presents emergency department with complaints of elevated blood pressure, headache.  Has had blood pressure elevations for the past couple of days.  Reports concurrent headache at that time.  Describes headache as frontal and dull in nature that began gradually has been present since onset.  Took Motrin without significant improvement of symptoms.  Took her blood pressure when she noticed headache yesterday and found to be elevated in the 190s systolic.  States that she has not been taking her blood pressures over the past couple of weeks due to weight loss and thought that her blood pressure was being managed by lifestyle changes.  Took her blood pressure medication this morning.  Denies any visual disturbance, gait abnormality, slurred speech, facial droop, weakness/sensory deficits in upper lower extremities.  Denies any chest pain, shortness of breath.  Past medical history significant for hypertension, heart murmur, uterine fibroid, hemorrhoid  Home Medications Prior to Admission medications   Medication Sig Start Date End Date Taking? Authorizing Provider  amLODipine  (NORVASC ) 10 MG tablet TAKE 1 TABLET BY MOUTH DAILY 04/12/22   Saguier, Gaylin Ke, PA-C  cetirizine  (ZYRTEC ) 10 MG tablet Take 1 tablet (10 mg total) by mouth daily. 08/27/23   McElwee, Lauren A, NP  fluticasone  (FLONASE ) 50 MCG/ACT nasal spray Place 2 sprays into both nostrils daily. 07/04/21   Tonna Frederic, MD  hydrOXYzine  (VISTARIL ) 25 MG capsule Take 1 capsule (25 mg total) by mouth at bedtime as needed. 04/19/23   Gavin Kast, FNP  levonorgestrel  (MIRENA ) 20 MCG/DAY IUD 1 each by Intrauterine route once.    [provider]   losartan  (COZAAR ) 100 MG tablet TAKE 1 TABLET BY MOUTH DAILY 09/14/22   Saguier, Gaylin Ke, PA-C  promethazine -dextromethorphan (PROMETHAZINE -DM) 6.25-15 MG/5ML syrup Take 5 mLs by mouth 4 (four) times daily as needed. 09/05/23   Graig Lawyer, MD  Pseudoephedrine HCl (SUDAFED PO) Take by mouth as needed.    [provider]      Allergies    Patient has no known allergies.    Review of Systems   Review of Systems  All other systems reviewed and are negative.   Physical Exam Updated Vital Signs BP (!) 149/104   Pulse 89   Temp 98.3 F (36.8 C) (Oral)   Resp (!) 21   Ht 5\' 4"  (1.626 m)   Wt 66.5 kg   SpO2 99%   BMI 25.16 kg/m  Physical Exam Vitals and nursing note reviewed.  Constitutional:      General: She is not in acute distress.    Appearance: She is well-developed.  HENT:     Head: Normocephalic and atraumatic.  Eyes:     Conjunctiva/sclera: Conjunctivae normal.  Cardiovascular:     Rate and Rhythm: Normal rate and regular rhythm.     Heart sounds: No murmur heard. Pulmonary:     Effort: Pulmonary effort is normal. No respiratory distress.     Breath sounds: Normal breath sounds.  Abdominal:     Palpations: Abdomen is soft.     Tenderness: There is no abdominal tenderness.  Musculoskeletal:        General: No swelling.     Cervical back: Neck supple.  Skin:    General: Skin is warm and dry.     Capillary Refill: Capillary refill takes less than 2 seconds.  Neurological:     Mental Status: She is alert.     Comments: Alert and oriented to self, place, time and event.   Speech is fluent, clear without dysarthria or dysphasia.   Strength 5/5 in upper/lower extremities   Sensation intact in upper/lower extremities   Normal gait.  CN I not tested  CN II not tested CN III, IV, VI PERRLA and EOMs intact bilaterally  CN V Intact sensation to sharp and light touch to the face  CN VII facial movements symmetric  CN VIII not tested  CN IX, X no uvula  deviation, symmetric rise of soft palate  CN XI 5/5 SCM and trapezius strength bilaterally  CN XII Midline tongue protrusion, symmetric L/R movements     Psychiatric:        Mood and Affect: Mood normal.     ED Results / Procedures / Treatments   Labs (all labs ordered are listed, but only abnormal results are displayed) Labs Reviewed  COMPREHENSIVE METABOLIC PANEL WITH GFR - Abnormal; Notable for the following components:      Result Value   Glucose, Bld 110 (*)    All other components within normal limits  CBC WITH DIFFERENTIAL/PLATELET    EKG None  Radiology No results found.  Procedures Procedures    Medications Ordered in ED Medications  metoCLOPramide (REGLAN) injection 10 mg (10 mg Intravenous Given 09/30/23 1357)  diphenhydrAMINE (BENADRYL) injection 25 mg (25 mg Intravenous Given 09/30/23 1357)  acetaminophen  (TYLENOL ) tablet 1,000 mg (1,000 mg Oral Given 09/30/23 1357)    ED Course/ Medical Decision Making/ A&P                                 Medical Decision Making Amount and/or Complexity of Data Reviewed Labs: ordered. Radiology: ordered.  Risk OTC drugs. Prescription drug management.   This patient presents to the ED for concern of hypertension, headache, this involves an extensive number of treatment options, and is a complaint that carries with it a high risk of complications and morbidity.  The differential diagnosis includes migraine/tension/cluster headache, CVA, ACS, CAD, AKI, hypertensive urgency, hypertensive emergency, other   Co morbidities that complicate the patient evaluation  See HPI   Additional history obtained:  Additional history obtained from EMR External records from outside source obtained and reviewed including hospital records   Lab Tests:  I Ordered, and personally interpreted labs.  The pertinent results include: No leukocytosis.  No evidence of anemia.  Platelets within range.  No trichomonas.  No transaminitis.  No  renal dysfunction.   Imaging Studies ordered:  I ordered imaging studies including CT head I independently visualized and interpreted imaging which showed no acute intracranial abnormality I agree with the radiologist interpretation   Cardiac Monitoring: / EKG:  The patient was maintained on a cardiac monitor.  I personally viewed and interpreted the cardiac monitored which showed an underlying rhythm of: Sinus rhythm   Consultations Obtained:  N/a   Problem List / ED Course / Critical interventions / Medication management  HTN, headache I ordered medication including Reglan, Benadryl, Tylenol   Reevaluation of the patient after these medicines showed that the patient improved I have reviewed the patients home medicines and have made adjustments as needed   Social Determinants of Health:  Denies  tobacco, licit drug use.   Test / Admission - Considered:  HTN, headache Vitals signs significant for hypertension initial blood pressure 180 systolic which improved with time labs and medicines administered in the emergency department.. Otherwise within normal range and stable throughout visit. Laboratory/imaging studies significant for: See above 53 year old female presents emergency department with complaints of elevated blood pressure, headache.  Has had blood pressure elevations for the past couple of days.  Reports concurrent headache at that time.  Describes headache as frontal and dull in nature that began gradually has been present since onset.  Took Motrin without significant improvement of symptoms.  Took her blood pressure when she noticed headache yesterday and found to be elevated in the 190s systolic.  States that she has not been taking her blood pressures over the past couple of weeks due to weight loss and thought that her blood pressure was being managed by lifestyle changes.  Took her blood pressure medication this morning.  Denies any visual disturbance, gait  abnormality, slurred speech, facial droop, weakness/sensory deficits in upper lower extremities.  Denies any chest pain, shortness of breath. On exam, nonfocal neurologic exam.  Labs unremarkable for any acute process.  CT head negative.  Patient's blood pressure improved from time of arrival 180/108 to 146/92 upon discharge without any antihypertensive medications administered.  Patient also treated with migraine cocktail and resolution of headache.  CT head was obtained given patient's initial elevated blood pressure today as well as outpatient blood pressures in the 190s systolic which was negative for any acute abnormality.  Patient reassured by findings.  Will recommend continued blood pressure medications and follow-up with PCP.  Treatment plan discussed with patient and she denies understanding was agreeable to set plan.  Patient well-appearing, afebrile in no acute distress. Worrisome signs and symptoms were discussed with the patient, and the patient acknowledged understanding to return to the ED if noticed. Patient was stable upon discharge.          Final Clinical Impression(s) / ED Diagnoses Final diagnoses:  None    Rx / DC Orders ED Discharge Orders     None          Butter, [place] 09/30/23 1506    Tegeler, Marine Sia, MD 09/30/23 1525

## 2024-01-23 ENCOUNTER — Encounter: Payer: Self-pay | Admitting: Family Medicine

## 2024-01-23 ENCOUNTER — Ambulatory Visit (INDEPENDENT_AMBULATORY_CARE_PROVIDER_SITE_OTHER): Admitting: Family Medicine

## 2024-01-23 VITALS — BP 132/80 | HR 78 | Temp 97.5°F | Ht 64.0 in | Wt 148.8 lb

## 2024-01-23 DIAGNOSIS — B351 Tinea unguium: Secondary | ICD-10-CM | POA: Diagnosis not present

## 2024-01-23 DIAGNOSIS — N644 Mastodynia: Secondary | ICD-10-CM | POA: Insufficient documentation

## 2024-01-23 DIAGNOSIS — I10 Essential (primary) hypertension: Secondary | ICD-10-CM

## 2024-01-23 MED ORDER — TERBINAFINE HCL 250 MG PO TABS: ORAL_TABLET | ORAL | 0 refills | Status: AC

## 2024-01-23 NOTE — Progress Notes (Signed)
 [facility] [address] [place] [address] Dept: [phone] Dept Fax: [phone]  Office Visit  Subjective:    Patient ID: Debra Ayers, female    DOB: 1970/08/29, 53 y.o..   MRN: [id number]  Chief Complaint  Patient presents with   Breast Pain    LT breast pain.  Needs referral to a surgeon.     History of Present Illness:  Patient is in today complaining of left breast pain over the past 1-2 months. Debra Ayers had saline breast implants placed in the [place] in Feb. 7975. She is concerned about developing capsular contracture. She has been using heat and Vitamin E oil.  Debra Ayers has a history of onychomycosis. She was previously treated with pulsed terbinafine  treatment and did note improvement. However, it never completely cleared and is now coming back.  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   Uterine leiomyoma 08/02/2016   Stress incontinence 08/02/2016   Chronic idiopathic constipation 08/12/2014   Herpetic whitlow 02/12/2012   Essential hypertension 02/12/2012   Past Surgical History:  Procedure Laterality Date   AUGMENTATION MAMMAPLASTY Bilateral 07/2020   BACK SURGERY  2004   Benign fatty tumor removed from mid upper back   BREAST ENHANCEMENT SURGERY  2010   BREAST IMPLANT REMOVAL  2013   BREAST IMPLANT REMOVAL     BREAST SURGERY  2014   Left breast biopsy, benign   HERNIA REPAIR     LIPOSUCTION  06/2020   TUBAL LIGATION  2001   tummy tuck  06/2020   UMBILICAL HERNIA REPAIR  2018   Family History  Problem Relation  Age of Onset   Arthritis Mother    Osteoporosis Mother    Diabetes Mother    Dementia Mother    Hypertension Father    Diabetes Son    Diabetes Maternal Aunt    Diabetes Maternal Uncle    Cancer Maternal Grandmother        Leukemia   Diabetes Maternal Grandmother    Breast cancer Neg Hx    Outpatient Medications Prior to Visit  Medication Sig Dispense Refill   amLODipine  (NORVASC ) 10 MG tablet TAKE 1 TABLET BY MOUTH DAILY 90 tablet 3   cetirizine  (ZYRTEC ) 10 MG tablet Take 1 tablet (10 mg total) by mouth daily. 30 tablet 0   fluticasone  (FLONASE ) 50 MCG/ACT nasal spray Place 2 sprays into both nostrils daily. 16 g 3   hydrOXYzine  (VISTARIL ) 25 MG capsule Take 1 capsule (25 mg total) by mouth at bedtime as needed. 30 capsule 0   levonorgestrel  (MIRENA ) 20 MCG/DAY IUD 1 each by Intrauterine route once.     losartan  (COZAAR ) 100 MG tablet TAKE 1 TABLET BY MOUTH DAILY 90 tablet 3   Pseudoephedrine HCl (SUDAFED PO) Take by mouth as needed.     promethazine -dextromethorphan (PROMETHAZINE -DM) 6.25-15 MG/5ML syrup Take 5 mLs by mouth 4 (four) times daily as needed. 118 mL 0   No facility-administered medications prior to visit.   No Known Allergies   Objective:   Today's Vitals   01/23/24 1553  BP: 132/80  Pulse: 78  Temp: (!) 97.5 F (36.4 C)  TempSrc: Temporal  Weight: 148 lb 12.8 oz (67.5 kg)  Height: 5' 4 (1.626 m)   Body mass index is 25.54 kg/m.   General: Well developed, well nourished. No acute distress. Breast: The upper globe of the left breast feels firm to the touch. Psych: Alert and oriented. Normal mood and affect.  Health Maintenance Due  Topic Date Due   Pneumococcal Vaccine: 50+ Years (1 of 2 - PCV) Never done   Hepatitis B Vaccines 19-59 Average Risk (1 of 3 - 19+ 3-dose series) Never done   Colonoscopy  Never done   Mammogram  05/26/2023   Assessment & Plan:   Problem List Items Addressed This Visit       Cardiovascular and Mediastinum    Essential hypertension   Blood pressure is adequately controlled. Continue amlodipine  10 mg and losartan  100 mg daily.        Musculoskeletal and Integument   Onychomycosis   I will renew terbinafine  pulse therapy, 250 mg daily for 7 days, repeated every 4 weeks. LFTs in June were normal.      Relevant Medications   terbinafine  (LAMISIL ) 250 MG tablet     Other   Breast pain, left - Primary   This could represent capsule contracture of the left breast. I will refer her to a plastic surgeon for evaluation. She asked about Accolate therapy. As I have no experience with this, I will let the surgeon decide.      Relevant Orders   Ambulatory referral to Plastic Surgery    Return if symptoms worsen or fail to improve.   Garnette CHRISTELLA Simpler, MD

## 2024-01-23 NOTE — Assessment & Plan Note (Signed)
 This could represent capsule contracture of the left breast. I will refer her to a plastic surgeon for evaluation. She asked about Accolate therapy. As I have no experience with this, I will let the surgeon decide.

## 2024-01-23 NOTE — Assessment & Plan Note (Signed)
 I will renew terbinafine  pulse therapy, 250 mg daily for 7 days, repeated every 4 weeks. LFTs in June were normal.

## 2024-01-23 NOTE — Assessment & Plan Note (Signed)
 Blood pressure is adequately controlled. Continue amlodipine  10 mg and losartan  100 mg daily.

## 2024-02-06 ENCOUNTER — Institutional Professional Consult (permissible substitution): Admitting: Plastic Surgery

## 2024-03-14 ENCOUNTER — Telehealth: Payer: Self-pay

## 2024-03-14 ENCOUNTER — Ambulatory Visit: Admitting: Family Medicine

## 2024-03-14 VITALS — BP 130/86 | HR 90 | Temp 98.2°F | Ht 64.0 in | Wt 149.0 lb

## 2024-03-14 DIAGNOSIS — H60331 Swimmer's ear, right ear: Secondary | ICD-10-CM | POA: Diagnosis not present

## 2024-03-14 DIAGNOSIS — G4762 Sleep related leg cramps: Secondary | ICD-10-CM | POA: Diagnosis not present

## 2024-03-14 MED ORDER — MELOXICAM 15 MG PO TABS: 15.0000 mg | ORAL_TABLET | Freq: Every day | ORAL | 0 refills | Status: AC | PRN

## 2024-03-14 MED ORDER — CIPROFLOXACIN HCL 0.3 % OP SOLN: 4.0000 [drp] | Freq: Two times a day (BID) | OPHTHALMIC | 0 refills | Status: DC

## 2024-03-14 MED ORDER — CIPROFLOXACIN HCL 0.3 % OP SOLN: 4.0000 [drp] | Freq: Two times a day (BID) | OPHTHALMIC | 0 refills | Status: AC

## 2024-03-14 NOTE — Telephone Encounter (Signed)
 Spoke with Sonya with the pharmacy, Informed her Dr. Sebastian is in a room at the moment and i would call back soon as he clarified.

## 2024-03-14 NOTE — Addendum Note (Signed)
 Addended by: SEBASTIAN RIGHTER B on: 03/14/2024 10:50 AM   Modules accepted: Orders

## 2024-03-14 NOTE — Progress Notes (Signed)
 Assessment & Plan   Assessment/Plan:   Assessment and Plan Assessment & Plan Right ear otitis externa Acute right ear otitis externa with pain rated at 7/10, tenderness, and drainage. No fever, dizziness, or hearing loss. Likely due to moisture retention and bacterial growth in the ear canal. No recent swimming or use of Q-tips. - Prescribed ciprofloxacin  ear drops for 5-7 days, pending insurance coverage. - Prescribed meloxicam  for pain management, to be taken once daily, preferably before bed.  Nocturnal leg cramps Experiencing nocturnal leg cramps 3-4 times a week, often at night. Likely multifactorial, possibly related to dehydration, stress, or poor sleep hygiene. No immediate concern for serious underlying condition. - Recommended stretching exercises before bed and when cramps are anticipated. - Suggested considering B vitamin complex, magnesium, or vitamin K2 supplements. - Advised on maintaining hydration, adequate sleep, and routine exercise. - Discussed sleep hygiene practices, including keeping the room cool and dark, and avoiding phone use before bed.          There are no discontinued medications.  Return if symptoms worsen or fail to improve.        Subjective:   Encounter date: 03/14/2024  Debra Ayers is a 53 y.o. female who has Herpetic whitlow; Essential hypertension; Chronic idiopathic constipation; Uterine leiomyoma; Stress incontinence; Second degree hemorrhoids; Heart murmur; Seasonal allergic rhinitis due to pollen; Prediabetes; Insomnia; Fibroadenoma of left breast; Onychomycosis; Breast pain, left; Nonfamilial nocturnal leg cramps; and Acute swimmer's ear of right side on their problem list..   She  has a past medical history of Anemia, Heart murmur, Hypertension, Pre-diabetes, and Uterine fibroid..   She presents with chief complaint of Ear Pain (Pt presents today for a ear infection,Pt state her right ear has been causing her pain for 3  days. Hasn't tried to take anything for It. ) .  Discussed the use of AI scribe software for clinical note transcription with the patient, who gave verbal consent to proceed.  History of Present Illness Debra Ayers is a 53 year old female who presents with right ear otalgia for three days.  Right ear otalgia - Right ear pain for three days, progressively worsening but slightly improved today - Pain is tender to touch, located both inside and outside the ear, radiating to the back of the ear - Pain severity rated as 7 out of 10 - Pain prevents sleeping on the affected side - No pressure sensation, dizziness, ear drainage, fever, runny nose, cough, sore throat, or shortness of breath - No medications taken for discomfort - No history of ear infections - Occasional ear pain associated with allergies, but allergies have not been severe this year  Nocturnal leg cramps and sleep disturbance - Nocturnal leg cramps occurring three to four times per week, triggered by full leg extension - Cramps described as unusual - Difficulty sleeping, attributed to possible poor sleep hygiene    ROS  Past Surgical History:  Procedure Laterality Date   AUGMENTATION MAMMAPLASTY Bilateral 07/2020   BACK SURGERY  2004   Benign fatty tumor removed from mid upper back   BREAST ENHANCEMENT SURGERY  2010   BREAST IMPLANT REMOVAL  2013   BREAST IMPLANT REMOVAL     BREAST SURGERY  2014   Left breast biopsy, benign   HERNIA REPAIR     LIPOSUCTION  06/2020   TUBAL LIGATION  2001   tummy tuck  06/2020   UMBILICAL HERNIA REPAIR  2018    Current Outpatient Medications on File  Prior to Visit  Medication Sig Dispense Refill   amLODipine  (NORVASC ) 10 MG tablet TAKE 1 TABLET BY MOUTH DAILY 90 tablet 3   fluticasone  (FLONASE ) 50 MCG/ACT nasal spray Place 2 sprays into both nostrils daily. 16 g 3   levonorgestrel  (MIRENA ) 20 MCG/DAY IUD 1 each by Intrauterine route once.     losartan  (COZAAR ) 100  MG tablet TAKE 1 TABLET BY MOUTH DAILY 90 tablet 3   terbinafine  (LAMISIL ) 250 MG tablet Take 1 tablet (250 mg total) by mouth daily for 1 week. Repeat every 4 weeks for 6 cycles. 42 tablet 0   cetirizine  (ZYRTEC ) 10 MG tablet Take 1 tablet (10 mg total) by mouth daily. 30 tablet 0   hydrOXYzine  (VISTARIL ) 25 MG capsule Take 1 capsule (25 mg total) by mouth at bedtime as needed. 30 capsule 0   Pseudoephedrine HCl (SUDAFED PO) Take by mouth as needed.     No current facility-administered medications on file prior to visit.    Family History  Problem Relation Age of Onset   Arthritis Mother    Osteoporosis Mother    Diabetes Mother    Dementia Mother    Hypertension Father    Diabetes Son    Diabetes Maternal Aunt    Diabetes Maternal Uncle    Cancer Maternal Grandmother        Leukemia   Diabetes Maternal Grandmother    Breast cancer Neg Hx     Social History   Socioeconomic History   Marital status: Widowed    Spouse name: Not on file   Number of children: 2   Years of education: Not on file   Highest education level: Not on file  Occupational History   Occupation: Product/process Development Scientist    Employer: [employer]    Comment: [employer]  Tobacco Use   Smoking status: Never   Smokeless tobacco: Never  Vaping Use   Vaping status: Never Used  Substance and Sexual Activity   Alcohol use: Yes    Alcohol/week: 3.0 standard drinks of alcohol    Types: 3 Cans of beer per week    Comment: socially   Drug use: No   Sexual activity: Yes    Birth control/protection: I.U.D.  Other Topics Concern   Not on file  Social History Narrative   Not on file   Social Drivers of Health   Financial Resource Strain: Not on file  Food Insecurity: Not on file  Transportation Needs: Not on file  Physical Activity: Not on file  Stress: Not on file  Social Connections: Not on file  Intimate Partner Violence: Not on file                                                                                                   Objective:  Physical Exam: BP 130/86   Pulse 90   Temp 98.2 F (36.8 C)   Ht 5' 4 (1.626 m)   Wt 149 lb (67.6 kg)   SpO2 100%   BMI 25.58 kg/m    Physical Exam Constitutional:  Appearance: Normal appearance.  HENT:     Head: Normocephalic and atraumatic.     Right Ear: Hearing, tympanic membrane, ear canal and external ear normal. No decreased hearing noted. Drainage, swelling and tenderness present. No middle ear effusion. Tympanic membrane is not injected.     Left Ear: Hearing, tympanic membrane, ear canal and external ear normal.     Nose: Nose normal.     Right Sinus: No maxillary sinus tenderness or frontal sinus tenderness.     Left Sinus: No maxillary sinus tenderness or frontal sinus tenderness.     Mouth/Throat:     Mouth: Mucous membranes are moist.     Dentition: Normal dentition.     Pharynx: Oropharynx is clear.     Tonsils: No tonsillar exudate. 0 on the right. 0 on the left.  Eyes:     General: No scleral icterus.       Right eye: No discharge.        Left eye: No discharge.     Extraocular Movements: Extraocular movements intact.  Neck:     Thyroid : No thyroid  mass.  Pulmonary:     Effort: Pulmonary effort is normal.  Musculoskeletal:     Cervical back: Full passive range of motion without pain, normal range of motion and neck supple.  Lymphadenopathy:     Cervical: No cervical adenopathy.  Skin:    General: Skin is warm.     Findings: No rash.  Neurological:     General: No focal deficit present.     Mental Status: She is alert.     Cranial Nerves: No cranial nerve deficit.  Psychiatric:        Mood and Affect: Mood normal.        Behavior: Behavior normal.        Thought Content: Thought content normal.        Judgment: Judgment normal.     No results found.  No results found for this or any previous visit (from the past 2160 hours).      Beverley KATHEE Hummer, MD  I,Emily Lagle,acting as a scribe for Beverley KATHEE Hummer, MD.,have documented all relevant documentation on the behalf of Beverley KATHEE Hummer, MD.  LILLETTE Beverley KATHEE Hummer, MD, have reviewed all documentation for this visit. The documentation on 03/14/2024 for the exam, diagnosis, procedures, and orders are all accurate and complete.

## 2024-03-14 NOTE — Patient Instructions (Addendum)
 It was very nice to see you today!  VISIT SUMMARY: Today, you were seen for right ear pain and nocturnal leg cramps. You were diagnosed with an outer ear infection and given treatment for it. We also discussed your leg cramps and sleep issues, and provided recommendations to help manage these problems.  YOUR PLAN: RIGHT EAR OTITIS EXTERNA: You have an infection in the outer part of your right ear, causing pain and tenderness. -Start using ciprofloxacin  ear drops: 4 drops twice a day for 7 days, as prescribed, pending insurance coverage. -Take meloxicam  once daily for pain management, preferably before bed.  NOCTURNAL LEG CRAMPS: You are experiencing leg cramps at night, which may be due to dehydration, stress, or poor sleep habits. -Do stretching exercises before bed and when you feel cramps coming on. -Consider taking B vitamin complex, magnesium, or vitamin K2 supplements. -Stay hydrated, get enough sleep, and maintain a regular exercise routine. -Follow good sleep hygiene practices, such as keeping your room cool and dark, and avoiding phone use before bed.  SLEEP DISTURBANCE: You have difficulty sleeping, which may be contributing to your leg cramps. -Follow good sleep hygiene practices, such as keeping your room cool and dark, avoiding phone use before bed, and establishing a regular sleep routine.  No follow-ups on file.   Take care, Arvella Hummer, MD, MS   PLEASE NOTE:  If you had any lab tests, please let us  know if you have not heard back within a few days. You may see your results on mychart before we have a chance to review them but we will give you a call once they are reviewed by us .   If we ordered any referrals today, please let us  know if you have not heard from their office within the next week.   If you had any urgent prescriptions sent in today, please check with the pharmacy within an hour of our visit to make sure the prescription was transmitted appropriately.    Please try these tips to maintain a healthy lifestyle:  Eat at least 3 REAL meals and 1-2 snacks per day.  Aim for no more than 5 hours between eating.  If you eat breakfast, please do so within one hour of getting up.   Each meal should contain half fruits/vegetables, one quarter protein, and one quarter carbs (no bigger than a computer mouse)  Cut down on sweet beverages. This includes juice, soda, and sweet tea.   Drink at least 1 glass of water with each meal and aim for at least 8 glasses per day  Exercise at least 150 minutes every week.

## 2024-03-14 NOTE — Telephone Encounter (Signed)
 Copied from CRM #[id number]. Topic: Clinical - Prescription Issue >> Mar 14, 2024  9:12 AM Viola FALCON wrote: Reason for CRM: [facility] needs the direction for the ciprofloxacin  (CILOXAN ) 0.3 % ophthalmic solution that was sent over this morning. Please call [phone] (Work)

## 2024-04-10 ENCOUNTER — Other Ambulatory Visit: Payer: Self-pay | Admitting: Family Medicine

## 2024-04-10 DIAGNOSIS — H60331 Swimmer's ear, right ear: Secondary | ICD-10-CM

## 2024-05-09 ENCOUNTER — Other Ambulatory Visit: Payer: Self-pay

## 2024-05-09 DIAGNOSIS — I10 Essential (primary) hypertension: Secondary | ICD-10-CM

## 2024-05-09 MED ORDER — AMLODIPINE BESYLATE 10 MG PO TABS: 10.0000 mg | ORAL_TABLET | Freq: Every day | ORAL | 0 refills | Status: DC

## 2024-05-09 MED ORDER — AMLODIPINE BESYLATE 10 MG PO TABS: ORAL_TABLET | ORAL | 0 refills | Status: AC

## 2024-05-09 MED ORDER — LOSARTAN POTASSIUM 100 MG PO TABS: 100.0000 mg | ORAL_TABLET | Freq: Every day | ORAL | 3 refills | Status: AC

## 2024-05-09 NOTE — Telephone Encounter (Signed)
 Refill request for  Amlodipine  10 mg LR 04/12/22, #90, 3 rf (HX provider)  Losartan  100 mg LR 09/14/22, #90, 3 rf HX provider) LOV 01/23/24 FOV  06/06/24  She needs a 10 day supply sent to [facility] for the Amlodipine  since she is out of that.  Please review and advise.  Thanks. Dm/cma

## 2024-05-09 NOTE — Telephone Encounter (Signed)
 Copied from CRM [id number]. Topic: Clinical - Medication Refill >> May 09, 2024 10:24 AM Adelita E wrote: Medication: amLODipine  (NORVASC ) 10 MG tablet  Has the patient contacted their pharmacy? Yes (Agent: If no, request that the patient contact the pharmacy for the refill. If patient does not wish to contact the pharmacy document the reason why and proceed with request.) (Agent: If yes, when and what did the pharmacy advise?)  This is the patient's preferred pharmacy:  [id number] - [place], [place] - [address] AT [address] [place] [address] Phone: [phone] Fax: [phone]    Is this the correct pharmacy for this prescription? Yes If no, delete pharmacy and type the correct one.   Has the prescription been filled recently? No  Is the patient out of the medication? Yes  Has the patient been seen for an appointment in the last year OR does the patient have an upcoming appointment? Yes  Can we respond through MyChart? Yes  Agent: Please be advised that Rx refills may take up to 3 business days. We ask that you follow-up with your pharmacy.

## 2024-05-22 ENCOUNTER — Other Ambulatory Visit: Payer: Self-pay | Admitting: Family Medicine

## 2024-05-22 DIAGNOSIS — I10 Essential (primary) hypertension: Secondary | ICD-10-CM

## 2024-06-06 ENCOUNTER — Ambulatory Visit: Admitting: Family Medicine
